# Patient Record
Sex: Male | Born: 1965 | Hispanic: Yes | Smoking: Never smoker
Health system: Southern US, Community
[De-identification: ages and names within clinical notes are randomized; demographics above are authoritative.]

## PROBLEM LIST (undated history)

## (undated) DIAGNOSIS — K219 Gastro-esophageal reflux disease without esophagitis: Secondary | ICD-10-CM

## (undated) DIAGNOSIS — I509 Heart failure, unspecified: Secondary | ICD-10-CM

## (undated) DIAGNOSIS — M549 Dorsalgia, unspecified: Secondary | ICD-10-CM

## (undated) DIAGNOSIS — I10 Essential (primary) hypertension: Secondary | ICD-10-CM

## (undated) DIAGNOSIS — G8929 Other chronic pain: Secondary | ICD-10-CM

## (undated) DIAGNOSIS — E7800 Pure hypercholesterolemia, unspecified: Secondary | ICD-10-CM

## (undated) DIAGNOSIS — I5022 Chronic systolic (congestive) heart failure: Secondary | ICD-10-CM

## (undated) DIAGNOSIS — T6591XA Toxic effect of unspecified substance, accidental (unintentional), initial encounter: Secondary | ICD-10-CM

## (undated) DIAGNOSIS — N200 Calculus of kidney: Secondary | ICD-10-CM

## (undated) HISTORY — DX: Chronic systolic (congestive) heart failure: I50.22

## (undated) HISTORY — PX: CARDIAC CATHETERIZATION: SHX172

## (undated) HISTORY — PX: CORNEAL TRANSPLANT: SHX108

## (undated) HISTORY — PX: EYE SURGERY: SHX253

---
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
## 2012-11-15 ENCOUNTER — Emergency Department (HOSPITAL_COMMUNITY)
Admission: EM | Admit: 2012-11-15 | Discharge: 2012-11-15 | Disposition: A | Discharge status: Nursing Home (Includes ICF) | Payer: Self-pay | Attending: Emergency Medicine | Admitting: Emergency Medicine

## 2012-11-15 ENCOUNTER — Emergency Department (HOSPITAL_COMMUNITY): Payer: Self-pay

## 2012-11-15 DIAGNOSIS — Z79899 Other long term (current) drug therapy: Secondary | ICD-10-CM | POA: Insufficient documentation

## 2012-11-15 DIAGNOSIS — R0602 Shortness of breath: Secondary | ICD-10-CM | POA: Insufficient documentation

## 2012-11-15 DIAGNOSIS — I10 Essential (primary) hypertension: Secondary | ICD-10-CM | POA: Diagnosis present

## 2012-11-15 DIAGNOSIS — R9439 Abnormal result of other cardiovascular function study: Secondary | ICD-10-CM | POA: Diagnosis present

## 2012-11-15 DIAGNOSIS — R0789 Other chest pain: Secondary | ICD-10-CM | POA: Insufficient documentation

## 2012-11-15 DIAGNOSIS — R079 Chest pain, unspecified: Secondary | ICD-10-CM | POA: Diagnosis present

## 2012-11-15 DIAGNOSIS — I200 Unstable angina: Secondary | ICD-10-CM | POA: Insufficient documentation

## 2012-11-15 LAB — BASIC METABOLIC PANEL
Chloride: 103 mEq/L (ref 96–112)
GFR calc Af Amer: >90 mL/min (ref >90)
GFR calc non Af Amer: >90 mL/min (ref >90)
Potassium: 3.5 mEq/L (ref 3.5–5.1)
Sodium: 137 mEq/L (ref 135–145)

## 2012-11-15 LAB — POCT I-STAT TROPONIN I: Troponin i, poc: 0.01 ng/mL (ref 0.00–0.08)

## 2012-11-15 LAB — CBC
HCT: 43.2 % (ref 39.0–52.0)
Hemoglobin: 14.5 g/dL (ref 13.0–17.0)
MCHC: 33.6 g/dL (ref 30.0–36.0)
RDW: 14.3 % (ref 11.5–15.5)
WBC: 9.6 10*3/uL (ref 4.0–10.5)

## 2012-11-15 LAB — PRO B NATRIURETIC PEPTIDE: Pro B Natriuretic peptide (BNP): 310.3 pg/mL — ABNORMAL HIGH (ref 0–125)

## 2012-11-15 MED ORDER — SODIUM CHLORIDE 0.9 % IV SOLN
Freq: Once | INTRAVENOUS | Status: AC
  Administered 2012-11-15: 17:00:00 via INTRAVENOUS

## 2012-11-15 MED ORDER — NITROGLYCERIN 0.4 MG SL SUBL
0.4000 mg | SUBLINGUAL_TABLET | Freq: Once | SUBLINGUAL | Status: AC
  Administered 2012-11-15: 0.4 mg via SUBLINGUAL

## 2012-11-15 MED ORDER — HEPARIN BOLUS VIA INFUSION
4000.0000 [IU] | Freq: Once | INTRAVENOUS | Status: AC
  Administered 2012-11-15: 4000 [IU] via INTRAVENOUS

## 2012-11-15 MED ORDER — HEPARIN (PORCINE) IN NACL 100-0.45 UNIT/ML-% IJ SOLN
1000.0000 [IU]/h | INTRAMUSCULAR | Status: DC
  Administered 2012-11-15: 1000 [IU]/h via INTRAVENOUS
  Filled 2012-11-15 (×2): qty 250

## 2012-11-15 MED ORDER — NITROGLYCERIN 2 % TD OINT
1.0000 [in_us] | TOPICAL_OINTMENT | Freq: Once | TRANSDERMAL | Status: DC
  Filled 2012-11-15: qty 30

## 2012-11-15 MED ORDER — NITROGLYCERIN IN D5W 200-5 MCG/ML-% IV SOLN: 10.0000 ug/min | INTRAVENOUS | Status: DC

## 2012-11-15 MED ORDER — NITROGLYCERIN 0.4 MG SL SUBL
0.4000 mg | SUBLINGUAL_TABLET | SUBLINGUAL | Status: DC | PRN
  Administered 2012-11-15: 0.4 mg via SUBLINGUAL
  Filled 2012-11-15: qty 25

## 2012-11-15 MED ORDER — ASPIRIN 81 MG PO CHEW
324.0000 mg | CHEWABLE_TABLET | Freq: Once | ORAL | Status: AC
  Administered 2012-11-15: 324 mg via ORAL
  Filled 2012-11-15: qty 4
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
## 2012-11-15 NOTE — Progress Notes (Signed)
ANTICOAGULATION CONSULT NOTE - Initial Consult  Pharmacy Consult for IV heparin Indication: chest pain/ACS  No Known Allergies  Patient Measurements:   Wt 230 lb (105kg)  Vital Signs: Temp: 98.4 F (36.9 C) (08/24 1246) Temp src: Oral (08/24 1246) BP: 127/84 mmHg (08/24 1343) Pulse Rate: 61 (08/24 1343)  Labs:  Recent Labs  11/15/12 1255  HGB 14.5  HCT 43.2  PLT 331  CREATININE 0.91    CrCl is unknown because there is no height on file for the current visit.   Medical History: No past medical history on file.  Medications:  Scheduled:  . nitroGLYCERIN  0.4 mg Sublingual Once   Infusions:  . nitroGLYCERIN      Assessment: 47 yo male with ACS/STEMI to start IV heparin per pharmacy dosing  Baseline CBC stable  Goal of Therapy:  Heparin level 0.3-0.7 units/ml Monitor platelets by anticoagulation protocol: Yes   Plan:  1) 4000 unit IV heparin bolus then 2) 1000 units/hr IV heparin 3) heparin level 6 hours after start heparin 4) daily heparin level and CBC while on IV heparin   Hessie Knows, PharmD, BCPS Pager [phone] 11/15/2012 4:14 PM

## 2012-11-15 NOTE — ED Notes (Signed)
At 1510 hours, Morrie Sheldon took over as his Radio producer.  At this time I give report to Gunnar Fusi, RN with [facility].  Pt. Remains in no distress.

## 2012-11-15 NOTE — ED Notes (Signed)
I placed him in room one, and thus far I have started IV and given meds.  He states his chest discomfort has resolved, but he now has a mild, generalized h/a.  His skin is normal, warm and dry and he is breathing normally.

## 2012-11-15 NOTE — ED Notes (Signed)
Pt states that he started having CP last night around 2130 while working.  States he feels SOB.  Had a cardiac cath at [facility] Aug 4.

## 2012-11-15 NOTE — ED Notes (Addendum)
Charge Social worker aware of pending nitro drip CP 1/10 at present- vital HR 61 BP 120/74. With nitro SL given earlier. Heparin started.- report given by Charged TIM RN to [employer]

## 2013-07-19 ENCOUNTER — Emergency Department (HOSPITAL_COMMUNITY): Payer: Self-pay

## 2013-07-19 ENCOUNTER — Encounter (HOSPITAL_COMMUNITY): Payer: Self-pay | Admitting: Emergency Medicine

## 2013-07-19 ENCOUNTER — Inpatient Hospital Stay (HOSPITAL_COMMUNITY)
Admission: EM | Admit: 2013-07-19 | Discharge: 2013-07-20 | DRG: 103 | Discharge status: Against Medical Advice | Payer: Self-pay | Attending: Internal Medicine | Admitting: Internal Medicine

## 2013-07-19 DIAGNOSIS — R51 Headache: Principal | ICD-10-CM

## 2013-07-19 DIAGNOSIS — I509 Heart failure, unspecified: Secondary | ICD-10-CM | POA: Diagnosis present

## 2013-07-19 DIAGNOSIS — R42 Dizziness and giddiness: Secondary | ICD-10-CM

## 2013-07-19 DIAGNOSIS — R079 Chest pain, unspecified: Secondary | ICD-10-CM

## 2013-07-19 DIAGNOSIS — I10 Essential (primary) hypertension: Secondary | ICD-10-CM | POA: Diagnosis present

## 2013-07-19 DIAGNOSIS — R519 Headache, unspecified: Secondary | ICD-10-CM

## 2013-07-19 DIAGNOSIS — K219 Gastro-esophageal reflux disease without esophagitis: Secondary | ICD-10-CM | POA: Diagnosis present

## 2013-07-19 DIAGNOSIS — R0789 Other chest pain: Secondary | ICD-10-CM | POA: Diagnosis present

## 2013-07-19 HISTORY — DX: Other chronic pain: G89.29

## 2013-07-19 HISTORY — DX: Essential (primary) hypertension: I10

## 2013-07-19 HISTORY — DX: Heart failure, unspecified: I50.9

## 2013-07-19 HISTORY — DX: Dorsalgia, unspecified: M54.9

## 2013-07-19 LAB — CBC WITH DIFFERENTIAL/PLATELET
BASOS ABS: 0 10*3/uL (ref 0.0–0.1)
BASOS PCT: 0 % (ref 0–1)
EOS PCT: 2 % (ref 0–5)
Eosinophils Absolute: 0.2 10*3/uL (ref 0.0–0.7)
HEMATOCRIT: 40.7 % (ref 39.0–52.0)
HEMOGLOBIN: 14.2 g/dL (ref 13.0–17.0)
Lymphocytes Relative: 34 % (ref 12–46)
Lymphs Abs: 3.4 10*3/uL (ref 0.7–4.0)
MCH: 29.5 pg (ref 26.0–34.0)
MCHC: 34.9 g/dL (ref 30.0–36.0)
MCV: 84.4 fL (ref 78.0–100.0)
Monocytes Absolute: 0.8 10*3/uL (ref 0.1–1.0)
Monocytes Relative: 8 % (ref 3–12)
Neutro Abs: 5.4 10*3/uL (ref 1.7–7.7)
Neutrophils Relative %: 56 % (ref 43–77)
Platelets: 323 10*3/uL (ref 150–400)
RBC: 4.82 MIL/uL (ref 4.22–5.81)
RDW: 14.2 % (ref 11.5–15.5)
WBC: 9.9 10*3/uL (ref 4.0–10.5)

## 2013-07-19 LAB — COMPREHENSIVE METABOLIC PANEL
ALBUMIN: 3.8 g/dL (ref 3.5–5.2)
ALT: 38 U/L (ref 0–53)
AST: 23 U/L (ref 0–37)
Alkaline Phosphatase: 69 U/L (ref 39–117)
BILIRUBIN TOTAL: 0.2 mg/dL — AB (ref 0.3–1.2)
BUN: 30 mg/dL — AB (ref 6–23)
CHLORIDE: 104 meq/L (ref 96–112)
CO2: 24 mEq/L (ref 19–32)
CREATININE: 1.1 mg/dL (ref 0.50–1.35)
Calcium: 9.2 mg/dL (ref 8.4–10.5)
GFR calc Af Amer: 89 mL/min — ABNORMAL LOW (ref >90)
GFR, EST NON AFRICAN AMERICAN: 77 mL/min — AB (ref >90)
Glucose, Bld: 96 mg/dL (ref 70–99)
Potassium: 3.6 mEq/L — ABNORMAL LOW (ref 3.7–5.3)
Sodium: 143 mEq/L (ref 137–147)
Total Protein: 8 g/dL (ref 6.0–8.3)

## 2013-07-19 LAB — D-DIMER, QUANTITATIVE (NOT AT ARMC): D-Dimer, Quant: <0.27 ug/mL-FEU (ref 0.00–0.48)

## 2013-07-19 LAB — TROPONIN I

## 2013-07-19 LAB — PRO B NATRIURETIC PEPTIDE: Pro B Natriuretic peptide (BNP): 83.8 pg/mL (ref 0–125)

## 2013-07-19 MED ORDER — MORPHINE SULFATE 2 MG/ML IJ SOLN
2.0000 mg | Freq: Once | INTRAMUSCULAR | Status: AC
  Administered 2013-07-19: 2 mg via INTRAVENOUS

## 2013-07-19 MED ORDER — MORPHINE SULFATE 2 MG/ML IJ SOLN
INTRAMUSCULAR | Status: AC
Start: 2013-07-19 — End: 2013-07-19
  Administered 2013-07-19: 4 mg via INTRAVENOUS
  Filled 2013-07-19: qty 2

## 2013-07-19 MED ORDER — DIPHENHYDRAMINE HCL 50 MG/ML IJ SOLN
25.0000 mg | Freq: Once | INTRAMUSCULAR | Status: AC
  Administered 2013-07-20: 25 mg via INTRAVENOUS
  Filled 2013-07-19: qty 1

## 2013-07-19 MED ORDER — PROCHLORPERAZINE EDISYLATE 5 MG/ML IJ SOLN
10.0000 mg | Freq: Four times a day (QID) | INTRAMUSCULAR | Status: DC | PRN
  Administered 2013-07-20: 10 mg via INTRAVENOUS
  Filled 2013-07-19 (×2): qty 2

## 2013-07-19 MED ORDER — MORPHINE SULFATE 2 MG/ML IJ SOLN
INTRAMUSCULAR | Status: AC
  Filled 2013-07-19: qty 2

## 2013-07-19 MED ORDER — IOHEXOL 350 MG/ML SOLN
100.0000 mL | Freq: Once | INTRAVENOUS | Status: AC | PRN
  Administered 2013-07-19: 100 mL via INTRAVENOUS

## 2013-07-19 MED ORDER — MORPHINE SULFATE 4 MG/ML IJ SOLN: 4.0000 mg | Freq: Once | INTRAMUSCULAR | Status: AC

## 2013-07-19 MED ORDER — SODIUM CHLORIDE 0.9 % IV BOLUS (SEPSIS)
1000.0000 mL | Freq: Once | INTRAVENOUS | Status: AC
  Administered 2013-07-20: 1000 mL via INTRAVENOUS

## 2013-07-19 NOTE — ED Notes (Addendum)
Presents from work with sudden onset of headache, turned to coworker and stated he did not feel good, then collapsed, did not lose consciousness. Upon arrival to ED pt presents with chest pain, continues to repeat "my head, my head" in spanish, following commands, asking repetitive questions. Moaning in pain. Pt is alert.and 1 nitro by EMS

## 2013-07-19 NOTE — ED Provider Notes (Signed)
CSN: [id number]     Arrival date & time 07/19/13  2142 History   First MD Initiated Contact with Patient 07/19/13 2202     Chief Complaint  Patient presents with  . Headache  . Chest Pain     (Consider location/radiation/quality/duration/timing/severity/associated sxs/prior Treatment) The history is provided by the EMS personnel. The history is limited by a language barrier. A language interpreter was used.   history of present illness: Patient presents via EMS with chief complaint of headache and chest pain.  Patient is unable to indicate when his pain started.  Through the interpreter the patient continues to does state over and over "my head hurts and it feels like it's going to explode."  Patient also complaining of chest pain.  Patient unable to qualify chest pain.  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  He was at work today when a coworker came walking toward him.  He looked at his coworker and stated that he did not feel well" slumped over".  The patient never lost consciousness.  On EMS arrival the patient was continuing to complain over and over of his headache and chest pain but would not answer any additional questions.  Past Medical History  Diagnosis Date  . CHF (congestive heart failure)   . Hypertension    History reviewed. No pertinent past surgical history. History reviewed. No pertinent family history. History  Substance Use Topics  . Smoking status: Not on file  . Smokeless tobacco: Not on file  . Alcohol Use: Not on file    Review of Systems  Constitutional: Negative for fever.  Eyes: Negative.   Respiratory: Negative for shortness of breath.   Cardiovascular: Positive for chest pain.  Gastrointestinal: Positive for abdominal pain.  Genitourinary: Negative.   Musculoskeletal: Negative for neck pain.  Skin: Negative.   Neurological: Positive for dizziness and headaches.  All other systems reviewed and are negative.     Allergies  Review of  patient's allergies indicates no known allergies.  Home Medications   Prior to Admission medications   Not on File   BP 130/74  Pulse 61  Temp(Src) 97.2 F (36.2 C)  Resp 10  SpO2 96% Physical Exam  Nursing note and vitals reviewed. Constitutional: He appears well-developed and well-nourished.  HENT:  Head: Normocephalic and atraumatic.  Eyes: Conjunctivae and EOM are normal. Pupils are equal, round, and reactive to light.  Neck: Normal range of motion. Neck supple.  Cardiovascular: Normal rate, regular rhythm, normal heart sounds and intact distal pulses.   No murmur heard. Pulmonary/Chest: Effort normal and breath sounds normal. He has no wheezes. He has no rales.  Abdominal: Soft. He exhibits no distension. There is tenderness (generalized).  Musculoskeletal: Normal range of motion.  Neurological: He is alert. GCS eye subscore is 4. GCS verbal subscore is 4. GCS motor subscore is 6.  Bilateral weakness throughout upper extremities.    Skin: Skin is warm and dry.    ED Course  Procedures (including critical care time) Labs Review Labs Reviewed  COMPREHENSIVE METABOLIC PANEL - Abnormal; Notable for the following:    Potassium 3.6 (*)    BUN 30 (*)    Total Bilirubin 0.2 (*)    GFR calc non Af Amer 77 (*)    GFR calc Af Amer 89 (*)    All other components within normal limits  CBC WITH DIFFERENTIAL  TROPONIN I  PRO B NATRIURETIC PEPTIDE  D-DIMER, QUANTITATIVE    Imaging Review Ct  Head Wo Contrast  07/19/2013   CLINICAL DATA:  Headache.  EXAM: CT HEAD WITHOUT CONTRAST  TECHNIQUE: Contiguous axial images were obtained from the base of the skull through the vertex without intravenous contrast.  COMPARISON:  None.  FINDINGS: There is no evidence of acute infarction, mass lesion, or intra- or extra-axial hemorrhage on CT.  Evaluation is mildly suboptimal due to motion artifact.  The posterior fossa, including the cerebellum, brainstem and fourth ventricle, is within normal  limits. The third and lateral ventricles, and basal ganglia are unremarkable in appearance. The cerebral hemispheres are symmetric in appearance, with normal gray-white differentiation. No mass effect or midline shift is seen.  There is no evidence of fracture; visualized osseous structures are unremarkable in appearance. The orbits are within normal limits. Mucus retention cysts or polyps are seen within the maxillary sinuses bilaterally. The remaining paranasal sinuses and mastoid air cells are well-aerated. No significant soft tissue abnormalities are seen.  IMPRESSION: 1. No acute intracranial pathology seen on CT. 2. Mucus retention cysts or polyps seen within the maxillary sinuses bilaterally.   Electronically Signed   By: Roanna Raider M.D.   On: 07/19/2013 23:24   Dg Chest Portable 1 View  07/19/2013   CLINICAL DATA:  Chest pain and headache.  EXAM: PORTABLE CHEST - 1 VIEW  COMPARISON:  None.  FINDINGS: The lungs are well-aerated and clear. There is no evidence of focal opacification, pleural effusion or pneumothorax.  The cardiomediastinal silhouette is borderline enlarged. No acute osseous abnormalities are seen.  IMPRESSION: No acute cardiopulmonary process seen.  Borderline cardiomegaly.   Electronically Signed   By: Roanna Raider M.D.   On: 07/19/2013 22:58   Ct Angio Chest Aortic Dissect W &/or W/o  07/20/2013   CLINICAL DATA:  Collapse without loss of consciousness.  EXAM: CT ANGIOGRAPHY CHEST, ABDOMEN  TECHNIQUE: Multidetector CT imaging through the chest and abdomen was performed using the standard protocol during bolus administration of intravenous contrast. Multiplanar reconstructed images and MIPs were obtained and reviewed to evaluate the vascular anatomy.  CONTRAST:  OMNIPAQUE IOHEXOL 350 MG/ML SOLN  COMPARISON:  DG CHEST 1V PORT dated 07/19/2013  FINDINGS: CTA CHEST FINDINGS  The caliber of the thoracic aorta is normal. There is no evidence of a false lumen. There is no periaortic  hematoma. The cardiac chambers are mildly enlarged. There is no pericardial effusion. There are coronary artery calcifications demonstrated. There is no mediastinal nor hilar lymphadenopathy. The thoracic esophagus is normal in caliber. Contrast within the pulmonary arterial tree is normal in appearance.  At lung window settings there is no interstitial or alveolar infiltrate. There is subsegmental atelectasis in the dependent portion of both lungs. There is no pneumothorax or pneumomediastinum or pleural effusion. The bony thorax exhibits no acute abnormalities.  Review of the MIP images confirms the above findings.  CTA ABDOMEN FINDINGS  The caliber of the abdominal aorta is normal. There is no evidence of a false lumen. The proximal common iliac arteries exhibit normal caliber. The celiac trunk, the SMA, the renal arteries, and the IMA fill well at their origins. There is no periaortic hematoma.  The liver exhibits decreased density diffusely consistent with fatty infiltration. The gallbladder, pancreas, spleen, and adrenal glands are normal in appearance. The kidneys exhibit no evidence of obstruction. There are multiple renal cortical hypodensities most compatible with cysts. The perinephric fat is normal in appearance. There are no calcified kidney stones.  The visualized portions of the small and large bowel  exhibit no evidence of ileus or obstruction. The visualized portions of the lumbar spine also appear normal.  Review of the MIP images confirms the above findings.  IMPRESSION: 1. There is no evidence of thoracic or abdominal aortic dissection. The caliber of the thoracic and abdominal aorta is normal. 2. There is mild enlargement of the cardiac chambers without overt evidence of pulmonary edema. 3. There is minimal dependent atelectasis in both lungs. 4. No acute visceral abnormality within the abdomen is demonstrated. 5. There are multiple hypodensities within both kidneys which likely reflect cysts.  These will merit further evaluation with elective renal protocol MRI or renal ultrasound when the patient has recovered from his acute illness.   Electronically Signed   By: David  [place]   On: 07/20/2013 00:40   Ct Cta Abd/pel W/cm &/or W/o Cm  07/20/2013   CLINICAL DATA:  Collapse without loss of consciousness.  EXAM: CT ANGIOGRAPHY CHEST, ABDOMEN  TECHNIQUE: Multidetector CT imaging through the chest and abdomen was performed using the standard protocol during bolus administration of intravenous contrast. Multiplanar reconstructed images and MIPs were obtained and reviewed to evaluate the vascular anatomy.  CONTRAST:  OMNIPAQUE IOHEXOL 350 MG/ML SOLN  COMPARISON:  DG CHEST 1V PORT dated 07/19/2013  FINDINGS: CTA CHEST FINDINGS  The caliber of the thoracic aorta is normal. There is no evidence of a false lumen. There is no periaortic hematoma. The cardiac chambers are mildly enlarged. There is no pericardial effusion. There are coronary artery calcifications demonstrated. There is no mediastinal nor hilar lymphadenopathy. The thoracic esophagus is normal in caliber. Contrast within the pulmonary arterial tree is normal in appearance.  At lung window settings there is no interstitial or alveolar infiltrate. There is subsegmental atelectasis in the dependent portion of both lungs. There is no pneumothorax or pneumomediastinum or pleural effusion. The bony thorax exhibits no acute abnormalities.  Review of the MIP images confirms the above findings.  CTA ABDOMEN FINDINGS  The caliber of the abdominal aorta is normal. There is no evidence of a false lumen. The proximal common iliac arteries exhibit normal caliber. The celiac trunk, the SMA, the renal arteries, and the IMA fill well at their origins. There is no periaortic hematoma.  The liver exhibits decreased density diffusely consistent with fatty infiltration. The gallbladder, pancreas, spleen, and adrenal glands are normal in appearance. The kidneys  exhibit no evidence of obstruction. There are multiple renal cortical hypodensities most compatible with cysts. The perinephric fat is normal in appearance. There are no calcified kidney stones.  The visualized portions of the small and large bowel exhibit no evidence of ileus or obstruction. The visualized portions of the lumbar spine also appear normal.  Review of the MIP images confirms the above findings.  IMPRESSION: 1. There is no evidence of thoracic or abdominal aortic dissection. The caliber of the thoracic and abdominal aorta is normal. 2. There is mild enlargement of the cardiac chambers without overt evidence of pulmonary edema. 3. There is minimal dependent atelectasis in both lungs. 4. No acute visceral abnormality within the abdomen is demonstrated. 5. There are multiple hypodensities within both kidneys which likely reflect cysts. These will merit further evaluation with elective renal protocol MRI or renal ultrasound when the patient has recovered from his acute illness.   Electronically Signed   By: David  [place]   On: 07/20/2013 00:40     EKG Interpretation   Date/Time:  Monday July 19 2013 21:50:49 EDT Ventricular Rate:  76 PR Interval:  157 QRS Duration: 96 QT Interval:  387 QTC Calculation: 435 R Axis:   31 Text Interpretation:  Sinus rhythm Abnormal T, consider ischemia, diffuse  leads Minimal ST elevation, anterior leads Baseline wander in lead(s) V4  LVH, d/w Dr. Allyson Sabalberry No previous ECGs available Confirmed by Manus GunningANCOUR  MD,  STEPHEN [phone]([id number]) on 07/19/2013 10:26:50 PM      MDM   Final diagnoses: 1. Vertigo   2. Headache(784.0)   3. Chest pain      48 year old Hispanic male with history of hypertension and CHF who presents via EMS with complaints of sudden onset headache, dizziness, chest pain, and abdominal pain.  Vital signs stable arrival.  Difficulty obtaining history through the interpreter.  Patient stating over and over that his head hurt.  Exam remarkable for  some nonlocalized abdominal tenderness to palpation.  Patient also with weakness throughout bilateral upper extremities though inconsistent exam.  Patient stating that he is too dizzy to participate in testing of coordination or gait.  Initial presentation was concerning for possible subarachnoid hemorrhage, aortic dissection, ACS.  Labs obtained were unremarkable including troponin, BNP and d-dimer.  CT of the head was negative for any acute intracranial abnormality.  Patient presenting with an first 6 hours of headache and was negative CT doubt SAH. CT of the aorta was negative for dissection.  Patient called while in the emergency department.  He continued to complain of severe dizziness and weakness.  Neurology was consult with and evaluated patient.  Concern for possible complicated migraine though patient could have small infarct of the basilar artery that could also cause symptoms.  Unable to obtain additional CTA of the head and neck do to patient already receiving contrast.  Neurology recommends giving aspirin and migraine cocktail to see if headache and dizziness improved.  Recommended admission and possible MRI/MRA if symptoms do not improve with Migraine cocktail.  Hospitalist consulted and will admit for further management.    Cherre RobinsBryan Nealy Karapetian, MD 07/20/13 [phone]

## 2013-07-19 NOTE — ED Notes (Addendum)
Dr. Thad Ranger, Neurologist at bedside.

## 2013-07-20 ENCOUNTER — Encounter (HOSPITAL_COMMUNITY): Payer: Self-pay | Admitting: Emergency Medicine

## 2013-07-20 DIAGNOSIS — R51 Headache: Principal | ICD-10-CM

## 2013-07-20 DIAGNOSIS — R079 Chest pain, unspecified: Secondary | ICD-10-CM

## 2013-07-20 DIAGNOSIS — R42 Dizziness and giddiness: Secondary | ICD-10-CM

## 2013-07-20 DIAGNOSIS — R519 Headache, unspecified: Secondary | ICD-10-CM | POA: Diagnosis present

## 2013-07-20 DIAGNOSIS — R072 Precordial pain: Secondary | ICD-10-CM

## 2013-07-20 LAB — TROPONIN I

## 2013-07-20 LAB — BASIC METABOLIC PANEL
BUN: 28 mg/dL — ABNORMAL HIGH (ref 6–23)
CO2: 27 meq/L (ref 19–32)
CREATININE: 1.02 mg/dL (ref 0.50–1.35)
Calcium: 8.7 mg/dL (ref 8.4–10.5)
Chloride: 104 mEq/L (ref 96–112)
GFR calc non Af Amer: 84 mL/min — ABNORMAL LOW (ref >90)
Glucose, Bld: 98 mg/dL (ref 70–99)
Potassium: 3.4 mEq/L — ABNORMAL LOW (ref 3.7–5.3)
Sodium: 143 mEq/L (ref 137–147)

## 2013-07-20 MED ORDER — ASPIRIN 325 MG PO TABS: 325.0000 mg | ORAL_TABLET | Freq: Every day | ORAL | Status: DC

## 2013-07-20 MED ORDER — HEPARIN SODIUM (PORCINE) 5000 UNIT/ML IJ SOLN
5000.0000 [IU] | Freq: Three times a day (TID) | INTRAMUSCULAR | Status: DC
  Filled 2013-07-20 (×4): qty 1

## 2013-07-20 MED ORDER — SODIUM CHLORIDE 0.9 % IJ SOLN
3.0000 mL | Freq: Two times a day (BID) | INTRAMUSCULAR | Status: DC
  Administered 2013-07-20: 3 mL via INTRAVENOUS

## 2013-07-20 MED ORDER — POTASSIUM CHLORIDE CRYS ER 20 MEQ PO TBCR
40.0000 meq | EXTENDED_RELEASE_TABLET | Freq: Once | ORAL | Status: AC
  Administered 2013-07-20: 40 meq via ORAL

## 2013-07-20 MED ORDER — FUROSEMIDE 20 MG PO TABS: 20.0000 mg | ORAL_TABLET | Freq: Every day | ORAL | Status: DC

## 2013-07-20 MED ORDER — MORPHINE SULFATE 2 MG/ML IJ SOLN: 2.0000 mg | INTRAMUSCULAR | Status: DC | PRN

## 2013-07-20 MED ORDER — ASPIRIN EC 81 MG PO TBEC: 81.0000 mg | DELAYED_RELEASE_TABLET | Freq: Every day | ORAL | Status: DC

## 2013-07-20 MED ORDER — ASPIRIN 81 MG PO CHEW
324.0000 mg | CHEWABLE_TABLET | Freq: Once | ORAL | Status: AC
  Administered 2013-07-20: 324 mg via ORAL
  Filled 2013-07-20: qty 4

## 2013-07-20 NOTE — Discharge Summary (Signed)
Physician Discharge Summary  Patient ID: Phillip [name] MRN: [id number] DOB/AGE: 50/05/1965 50 y.o.  Admit date: 07/19/2013 Discharge date: 07/20/2013  Primary Care Physician:  No PCP Per Patient  Discharge Diagnoses:    . Headache Chest pain GERD  Consults:  Neurology, Dr. Thad Ranger                     Cardiology, Dr. Rennis Golden   Please note that the patient was officially being discharged but he decided to leave AMA prior to the discharge was completed.  Allergies:  No Known Allergies   Discharge Medications: Aspirin was only new medication written however patient did not take any prescription with him as he left AMA prior to discharge being completed   Medication List         aspirin EC 81 MG tablet  Take 1 tablet (81 mg total) by mouth daily.      benazepril-hydrochlorthiazide 20-12.5 MG per tablet  Commonly known as:  LOTENSIN HCT  Take 1 tablet by mouth daily.     carvedilol 25 MG tablet  Commonly known as:  COREG  Take 25 mg by mouth 2 (two) times daily with a meal.     enalapril 20 MG tablet  Commonly known as:  VASOTEC  Take 20 mg by mouth daily.     furosemide 20 MG tablet  Commonly known as:  LASIX  Take 1 tablet (20 mg total) by mouth daily.  Start taking on:  07/21/2013     prednisoLONE acetate 1 % ophthalmic suspension  Commonly known as:  PRED FORTE  Place 1 drop into the right eye 3 (three) times daily.     ranitidine 150 MG tablet  Commonly known as:  ZANTAC  Take 150 mg by mouth at bedtime.         Brief H and P: For complete details please refer to admission H and P, but in briefJose [name] is a 48 y.o. male who presents to the ED with sudden onset of severe headache at work today. He reported that he didn't feel good and then collapsed. No LOC. Patient was brought to the ED. Headache throughout his head. No associated nausea nor vomiting, does have dizziness which has persisted even after his headache is controlled with morphine  and a headache cocktail.   Hospital Course:  Sudden headache with dizziness  : This apparently happened at work with sudden onset headache, cranial with no associated nausea, vomiting but he did have dizziness, also described chest pain and back pain. CT of the head was negative for acute stroke or intracranial pathology. Neurology was consulted, he did not have any focal neurological deficits but did not give full cooperation due to pain. With dizziness, headache there was a concern for posterior circulation but also possibility of dizziness related to his headache. Patient was given migraine cocktail by neurology and recommended MRI/MRA if dizziness persists. Neuro checks were continued and patient was placed on aspirin. After patient's symptoms somewhat improved, patient wanted to leave, I discussed with on-call neurologist, Dr. Cyril Mourning who recommended to discontinue MRI/MRA if the patient has remained stable and no new neurological deficits.  Chest pain: At the time of encounter in the morning, patient was still having 2/10 chest pain . EKG reviewed with Dr. Rennis Golden, ? Minimal ST elevations, T wave inversions with repolarization changes in V5 and V 6. Patient was continued on aspirin, 2-D echocardiogram was obtained which showed EF of 55-60%, no regional wall motion abnormalities.  CT angiogram of the chest and abdomen showed no evidence of thoracic or abdominal aortic dissection. His outpatient records were obtained, he had a normal cath in August 2014. Nuclear medicine stress test was canceled.   He was actually cleared for discharge however patient did not stay for official discharge.   Day of Discharge BP 121/69  Pulse 78  Temp(Src) 98.4 F (36.9 C) (Oral)  Resp 22  Wt 101.424 kg (223 lb 9.6 oz)  SpO2 98%  Physical Exam: General: Alert and awake oriented x3 not in any acute distress. CVS: S1-S2 clear no murmur rubs or gallops Chest: clear to auscultation bilaterally, no wheezing rales or  rhonchi Abdomen: soft nontender, nondistended, normal bowel sounds Extremities: no cyanosis, clubbing or edema noted bilaterally Neuro: Cranial nerves II-XII intact, no focal neurological deficits   The results of significant diagnostics from this hospitalization (including imaging, microbiology, ancillary and laboratory) are listed below for reference.    LAB RESULTS: Basic Metabolic Panel:  Recent Labs Lab 07/19/13 2155 07/20/13 1030  NA 143 143  K 3.6* 3.4*  CL 104 104  CO2 24 27  GLUCOSE 96 98  BUN 30* 28*  CREATININE 1.10 1.02  CALCIUM 9.2 8.7   Liver Function Tests:  Recent Labs Lab 07/19/13 2155  AST 23  ALT 38  ALKPHOS 69  BILITOT 0.2*  PROT 8.0  ALBUMIN 3.8   No results found for this basename: LIPASE, AMYLASE,  in the last 168 hours No results found for this basename: AMMONIA,  in the last 168 hours CBC:  Recent Labs Lab 07/19/13 2155  WBC 9.9  NEUTROABS 5.4  HGB 14.2  HCT 40.7  MCV 84.4  PLT 323   Cardiac Enzymes:  Recent Labs Lab 07/20/13 1030 07/20/13 1500  TROPONINI <0.30 <0.30   BNP: No components found with this basename: POCBNP,  CBG: No results found for this basename: GLUCAP,  in the last 168 hours  Significant Diagnostic Studies:  Ct Head Wo Contrast  07/19/2013   CLINICAL DATA:  Headache.  EXAM: CT HEAD WITHOUT CONTRAST  TECHNIQUE: Contiguous axial images were obtained from the base of the skull through the vertex without intravenous contrast.  COMPARISON:  None.  FINDINGS: There is no evidence of acute infarction, mass lesion, or intra- or extra-axial hemorrhage on CT.  Evaluation is mildly suboptimal due to motion artifact.  The posterior fossa, including the cerebellum, brainstem and fourth ventricle, is within normal limits. The third and lateral ventricles, and basal ganglia are unremarkable in appearance. The cerebral hemispheres are symmetric in appearance, with normal gray-white differentiation. No mass effect or midline  shift is seen.  There is no evidence of fracture; visualized osseous structures are unremarkable in appearance. The orbits are within normal limits. Mucus retention cysts or polyps are seen within the maxillary sinuses bilaterally. The remaining paranasal sinuses and mastoid air cells are well-aerated. No significant soft tissue abnormalities are seen.  IMPRESSION: 1. No acute intracranial pathology seen on CT. 2. Mucus retention cysts or polyps seen within the maxillary sinuses bilaterally.   Electronically Signed   By: Roanna RaiderJeffery  Chang M.D.   On: 07/19/2013 23:24   Dg Chest Portable 1 View  07/19/2013   CLINICAL DATA:  Chest pain and headache.  EXAM: PORTABLE CHEST - 1 VIEW  COMPARISON:  None.  FINDINGS: The lungs are well-aerated and clear. There is no evidence of focal opacification, pleural effusion or pneumothorax.  The cardiomediastinal silhouette is borderline enlarged. No acute osseous abnormalities are seen.  IMPRESSION: No acute cardiopulmonary process seen.  Borderline cardiomegaly.   Electronically Signed   By: Roanna Raider M.D.   On: 07/19/2013 22:58   Ct Angio Chest Aortic Dissect W &/or W/o  07/20/2013   CLINICAL DATA:  Collapse without loss of consciousness.  EXAM: CT ANGIOGRAPHY CHEST, ABDOMEN  TECHNIQUE: Multidetector CT imaging through the chest and abdomen was performed using the standard protocol during bolus administration of intravenous contrast. Multiplanar reconstructed images and MIPs were obtained and reviewed to evaluate the vascular anatomy.  CONTRAST:  OMNIPAQUE IOHEXOL 350 MG/ML SOLN  COMPARISON:  DG CHEST 1V PORT dated 07/19/2013  FINDINGS: CTA CHEST FINDINGS  The caliber of the thoracic aorta is normal. There is no evidence of a false lumen. There is no periaortic hematoma. The cardiac chambers are mildly enlarged. There is no pericardial effusion. There are coronary artery calcifications demonstrated. There is no mediastinal nor hilar lymphadenopathy. The thoracic  esophagus is normal in caliber. Contrast within the pulmonary arterial tree is normal in appearance.  At lung window settings there is no interstitial or alveolar infiltrate. There is subsegmental atelectasis in the dependent portion of both lungs. There is no pneumothorax or pneumomediastinum or pleural effusion. The bony thorax exhibits no acute abnormalities.  Review of the MIP images confirms the above findings.  CTA ABDOMEN FINDINGS  The caliber of the abdominal aorta is normal. There is no evidence of a false lumen. The proximal common iliac arteries exhibit normal caliber. The celiac trunk, the SMA, the renal arteries, and the IMA fill well at their origins. There is no periaortic hematoma.  The liver exhibits decreased density diffusely consistent with fatty infiltration. The gallbladder, pancreas, spleen, and adrenal glands are normal in appearance. The kidneys exhibit no evidence of obstruction. There are multiple renal cortical hypodensities most compatible with cysts. The perinephric fat is normal in appearance. There are no calcified kidney stones.  The visualized portions of the small and large bowel exhibit no evidence of ileus or obstruction. The visualized portions of the lumbar spine also appear normal.  Review of the MIP images confirms the above findings.  IMPRESSION: 1. There is no evidence of thoracic or abdominal aortic dissection. The caliber of the thoracic and abdominal aorta is normal. 2. There is mild enlargement of the cardiac chambers without overt evidence of pulmonary edema. 3. There is minimal dependent atelectasis in both lungs. 4. No acute visceral abnormality within the abdomen is demonstrated. 5. There are multiple hypodensities within both kidneys which likely reflect cysts. These will merit further evaluation with elective renal protocol MRI or renal ultrasound when the patient has recovered from his acute illness.   Electronically Signed   By: David  [place]   On: 07/20/2013  00:40   Ct Cta Abd/pel W/cm &/or W/o Cm  07/20/2013   CLINICAL DATA:  Collapse without loss of consciousness.  EXAM: CT ANGIOGRAPHY CHEST, ABDOMEN  TECHNIQUE: Multidetector CT imaging through the chest and abdomen was performed using the standard protocol during bolus administration of intravenous contrast. Multiplanar reconstructed images and MIPs were obtained and reviewed to evaluate the vascular anatomy.  CONTRAST:  OMNIPAQUE IOHEXOL 350 MG/ML SOLN  COMPARISON:  DG CHEST 1V PORT dated 07/19/2013  FINDINGS: CTA CHEST FINDINGS  The caliber of the thoracic aorta is normal. There is no evidence of a false lumen. There is no periaortic hematoma. The cardiac chambers are mildly enlarged. There is no pericardial effusion. There are coronary artery calcifications demonstrated. There is no mediastinal  nor hilar lymphadenopathy. The thoracic esophagus is normal in caliber. Contrast within the pulmonary arterial tree is normal in appearance.  At lung window settings there is no interstitial or alveolar infiltrate. There is subsegmental atelectasis in the dependent portion of both lungs. There is no pneumothorax or pneumomediastinum or pleural effusion. The bony thorax exhibits no acute abnormalities.  Review of the MIP images confirms the above findings.  CTA ABDOMEN FINDINGS  The caliber of the abdominal aorta is normal. There is no evidence of a false lumen. The proximal common iliac arteries exhibit normal caliber. The celiac trunk, the SMA, the renal arteries, and the IMA fill well at their origins. There is no periaortic hematoma.  The liver exhibits decreased density diffusely consistent with fatty infiltration. The gallbladder, pancreas, spleen, and adrenal glands are normal in appearance. The kidneys exhibit no evidence of obstruction. There are multiple renal cortical hypodensities most compatible with cysts. The perinephric fat is normal in appearance. There are no calcified kidney stones.  The visualized  portions of the small and large bowel exhibit no evidence of ileus or obstruction. The visualized portions of the lumbar spine also appear normal.  Review of the MIP images confirms the above findings.  IMPRESSION: 1. There is no evidence of thoracic or abdominal aortic dissection. The caliber of the thoracic and abdominal aorta is normal. 2. There is mild enlargement of the cardiac chambers without overt evidence of pulmonary edema. 3. There is minimal dependent atelectasis in both lungs. 4. No acute visceral abnormality within the abdomen is demonstrated. 5. There are multiple hypodensities within both kidneys which likely reflect cysts. These will merit further evaluation with elective renal protocol MRI or renal ultrasound when the patient has recovered from his acute illness.   Electronically Signed   By: David  [place]   On: 07/20/2013 00:40    2D ECHO: Study Conclusions  - Left ventricle: The cavity size was normal. Wall thickness was normal. Systolic function was normal. The estimated ejection fraction was in the range of 55% to 60%. Wall motion was normal; there were no regional wall motion abnormalities. Left ventricular diastolic function parameters were normal. - Left atrium: The atrium was normal in size. - Tricuspid valve: Trivial regurgitation. - Inferior vena cava: The vessel was normal in size; the respirophasic diameter changes were in the normal range (= 50%); findings are consistent with normal central venous pressure. - Pericardium, extracardiac: There was no pericardial effusion.    Disposition and Follow-up:     Discharge Orders   Future Orders Complete By Expires   Diet - low sodium heart healthy  As directed    Discharge instructions  As directed    Increase activity slowly  As directed        DISPOSITION:  left AMA    Time spent on Discharge: 35 mins  Signed:   Ripudeep Jenna Luo M.D. [facility] 07/20/2013, 4:43 PM Pager:  [phone]   **Disclaimer: This note was dictated with voice recognition software. Similar sounding words can inadvertently be transcribed and this note may contain transcription errors which may not have been corrected upon publication of note.**

## 2013-07-20 NOTE — Progress Notes (Signed)
Utilization review completed.  

## 2013-07-20 NOTE — Progress Notes (Signed)
  Echocardiogram 2D Echocardiogram has been performed.  Phillip [name] 07/20/2013, 11:00 AM

## 2013-07-20 NOTE — Progress Notes (Signed)
Patient seen and examined, admitted by Dr. Roger Shelter this morning, history of chronic back pain from accident in April 2015, CHF, hypertension, admitted with sudden onset of severe headache, presyncope, chest pain.  BP 110/73  Pulse 53  Temp(Src) 97.2 F (36.2 C) (Oral)  Resp 18  Wt 101.424 kg (223 lb 9.6 oz)  SpO2 97%  Sudden headache with dizziness - MRI/MRA pending, neurology consulted, continue aspirin, neurochecks   Chest pain: Still having 2/10 chest pain - EKG reviewed with Dr. Rennis Golden, ? Minimal ST elevations, T wave inversions with repolarization changes in V5 and V 6 - Continue aspirin, obtain lipid panel - Troponins negative so far, obtain 2-D echocardiogram - Dr. Rennis Golden will see the patient, recommended nuclear medicine stress test in a.m.    Ripudeep Jenna Luo M.D. [facility] 07/20/2013, 12:30 PM  Pager: [phone]

## 2013-07-20 NOTE — ED Notes (Signed)
Interpreter number [phone]

## 2013-07-20 NOTE — Progress Notes (Signed)
Patient left AMA. Pt wanted to follow up with his own doctors at [facility]. Pt verbalized he will ask his MD for an MRI and he verbalized that he will follow up with his cardiologist. Patient stable not experiencing pain or dizziness. Pt walked out with his transportation.

## 2013-07-20 NOTE — ED Notes (Signed)
Pt c/o dizziness, pain is better.

## 2013-07-20 NOTE — H&P (Signed)
[facility] History and Physical  Kien Mirsky WUJ:[id number] DOB: Mar 15, 1966 DOA: 07/19/2013  Referring physician: EDP PCP: No PCP Per Patient   Chief Complaint: Headache   HPI: Phillip [name] is a 48 y.o. male who presents to the ED with sudden onset of severe headache at work today.  He reported that he didn't feel good and then collapsed.  No LOC.  Patient was brought to the ED.  Headache throughout his head.  No associated nausea nor vomiting, does have dizziness which has persisted even after his headache is controlled with morphine and a headache cocktail.  Review of Systems: Systems reviewed.  As above, otherwise negative  Past Medical History  Diagnosis Date  . CHF (congestive heart failure)   . Hypertension    History reviewed. No pertinent past surgical history. Social History:  has no tobacco, alcohol, and drug history on file.  No Known Allergies  History reviewed. No pertinent family history.   Prior to Admission medications   Not on File   Physical Exam: Filed Vitals:   07/20/13 0227  BP: 118/61  Pulse: 63  Temp: 97.5 F (36.4 C)  Resp: 16    BP 118/61  Pulse 63  Temp(Src) 97.5 F (36.4 C) (Oral)  Resp 16  Wt 101.424 kg (223 lb 9.6 oz)  SpO2 96%  General Appearance:    Alert, oriented, no distress, appears stated age  Head:    Normocephalic, atraumatic  Eyes:    PERRL, EOMI, sclera non-icteric        Nose:   Nares without drainage or epistaxis. Mucosa, turbinates normal  Throat:   Moist mucous membranes. Oropharynx without erythema or exudate.  Neck:   Supple. No carotid bruits.  No thyromegaly.  No lymphadenopathy.   Back:     No CVA tenderness, no spinal tenderness  Lungs:     Clear to auscultation bilaterally, without wheezes, rhonchi or rales  Chest wall:    No tenderness to palpitation  Heart:    Regular rate and rhythm without murmurs, gallops, rubs  Abdomen:     Soft, non-tender, nondistended, normal bowel sounds, no  organomegaly  Genitalia:    deferred  Rectal:    deferred  Extremities:   No clubbing, cyanosis or edema.  Pulses:   2+ and symmetric all extremities  Skin:   Skin color, texture, turgor normal, no rashes or lesions  Lymph nodes:   Cervical, supraclavicular, and axillary nodes normal  Neurologic:   CNII-XII intact. Normal strength, sensation and reflexes      throughout    Labs on Admission:  Basic Metabolic Panel:  Recent Labs Lab 07/19/13 2155  NA 143  K 3.6*  CL 104  CO2 24  GLUCOSE 96  BUN 30*  CREATININE 1.10  CALCIUM 9.2   Liver Function Tests:  Recent Labs Lab 07/19/13 2155  AST 23  ALT 38  ALKPHOS 69  BILITOT 0.2*  PROT 8.0  ALBUMIN 3.8   No results found for this basename: LIPASE, AMYLASE,  in the last 168 hours No results found for this basename: AMMONIA,  in the last 168 hours CBC:  Recent Labs Lab 07/19/13 2155  WBC 9.9  NEUTROABS 5.4  HGB 14.2  HCT 40.7  MCV 84.4  PLT 323   Cardiac Enzymes:  Recent Labs Lab 07/19/13 2155  TROPONINI <0.30    BNP (last 3 results)  Recent Labs  07/19/13 2155  PROBNP 83.8   CBG: No results found for this basename: GLUCAP,  in the last 168 hours  Radiological Exams on Admission: Ct Head Wo Contrast  07/19/2013   CLINICAL DATA:  Headache.  EXAM: CT HEAD WITHOUT CONTRAST  TECHNIQUE: Contiguous axial images were obtained from the base of the skull through the vertex without intravenous contrast.  COMPARISON:  None.  FINDINGS: There is no evidence of acute infarction, mass lesion, or intra- or extra-axial hemorrhage on CT.  Evaluation is mildly suboptimal due to motion artifact.  The posterior fossa, including the cerebellum, brainstem and fourth ventricle, is within normal limits. The third and lateral ventricles, and basal ganglia are unremarkable in appearance. The cerebral hemispheres are symmetric in appearance, with normal gray-white differentiation. No mass effect or midline shift is seen.  There is  no evidence of fracture; visualized osseous structures are unremarkable in appearance. The orbits are within normal limits. Mucus retention cysts or polyps are seen within the maxillary sinuses bilaterally. The remaining paranasal sinuses and mastoid air cells are well-aerated. No significant soft tissue abnormalities are seen.  IMPRESSION: 1. No acute intracranial pathology seen on CT. 2. Mucus retention cysts or polyps seen within the maxillary sinuses bilaterally.   Electronically Signed   By: Roanna Raider M.D.   On: 07/19/2013 23:24   Dg Chest Portable 1 View  07/19/2013   CLINICAL DATA:  Chest pain and headache.  EXAM: PORTABLE CHEST - 1 VIEW  COMPARISON:  None.  FINDINGS: The lungs are well-aerated and clear. There is no evidence of focal opacification, pleural effusion or pneumothorax.  The cardiomediastinal silhouette is borderline enlarged. No acute osseous abnormalities are seen.  IMPRESSION: No acute cardiopulmonary process seen.  Borderline cardiomegaly.   Electronically Signed   By: Roanna Raider M.D.   On: 07/19/2013 22:58   Ct Angio Chest Aortic Dissect W &/or W/o  07/20/2013   CLINICAL DATA:  Collapse without loss of consciousness.  EXAM: CT ANGIOGRAPHY CHEST, ABDOMEN  TECHNIQUE: Multidetector CT imaging through the chest and abdomen was performed using the standard protocol during bolus administration of intravenous contrast. Multiplanar reconstructed images and MIPs were obtained and reviewed to evaluate the vascular anatomy.  CONTRAST:  OMNIPAQUE IOHEXOL 350 MG/ML SOLN  COMPARISON:  DG CHEST 1V PORT dated 07/19/2013  FINDINGS: CTA CHEST FINDINGS  The caliber of the thoracic aorta is normal. There is no evidence of a false lumen. There is no periaortic hematoma. The cardiac chambers are mildly enlarged. There is no pericardial effusion. There are coronary artery calcifications demonstrated. There is no mediastinal nor hilar lymphadenopathy. The thoracic esophagus is normal in caliber.  Contrast within the pulmonary arterial tree is normal in appearance.  At lung window settings there is no interstitial or alveolar infiltrate. There is subsegmental atelectasis in the dependent portion of both lungs. There is no pneumothorax or pneumomediastinum or pleural effusion. The bony thorax exhibits no acute abnormalities.  Review of the MIP images confirms the above findings.  CTA ABDOMEN FINDINGS  The caliber of the abdominal aorta is normal. There is no evidence of a false lumen. The proximal common iliac arteries exhibit normal caliber. The celiac trunk, the SMA, the renal arteries, and the IMA fill well at their origins. There is no periaortic hematoma.  The liver exhibits decreased density diffusely consistent with fatty infiltration. The gallbladder, pancreas, spleen, and adrenal glands are normal in appearance. The kidneys exhibit no evidence of obstruction. There are multiple renal cortical hypodensities most compatible with cysts. The perinephric fat is normal in appearance. There are no calcified kidney  stones.  The visualized portions of the small and large bowel exhibit no evidence of ileus or obstruction. The visualized portions of the lumbar spine also appear normal.  Review of the MIP images confirms the above findings.  IMPRESSION: 1. There is no evidence of thoracic or abdominal aortic dissection. The caliber of the thoracic and abdominal aorta is normal. 2. There is mild enlargement of the cardiac chambers without overt evidence of pulmonary edema. 3. There is minimal dependent atelectasis in both lungs. 4. No acute visceral abnormality within the abdomen is demonstrated. 5. There are multiple hypodensities within both kidneys which likely reflect cysts. These will merit further evaluation with elective renal protocol MRI or renal ultrasound when the patient has recovered from his acute illness.   Electronically Signed   By: David  [place]   On: 07/20/2013 00:40   Ct Cta Abd/pel W/cm &/or  W/o Cm  07/20/2013   CLINICAL DATA:  Collapse without loss of consciousness.  EXAM: CT ANGIOGRAPHY CHEST, ABDOMEN  TECHNIQUE: Multidetector CT imaging through the chest and abdomen was performed using the standard protocol during bolus administration of intravenous contrast. Multiplanar reconstructed images and MIPs were obtained and reviewed to evaluate the vascular anatomy.  CONTRAST:  100mL OMNIPAQUE IOHEXOL 350 MG/ML SOLN  COMPARISON:  DG CHEST 1V PORT dated 07/19/2013  FINDINGS: CTA CHEST FINDINGS  The caliber of the thoracic aorta is normal. There is no evidence of a false lumen. There is no periaortic hematoma. The cardiac chambers are mildly enlarged. There is no pericardial effusion. There are coronary artery calcifications demonstrated. There is no mediastinal nor hilar lymphadenopathy. The thoracic esophagus is normal in caliber. Contrast within the pulmonary arterial tree is normal in appearance.  At lung window settings there is no interstitial or alveolar infiltrate. There is subsegmental atelectasis in the dependent portion of both lungs. There is no pneumothorax or pneumomediastinum or pleural effusion. The bony thorax exhibits no acute abnormalities.  Review of the MIP images confirms the above findings.  CTA ABDOMEN FINDINGS  The caliber of the abdominal aorta is normal. There is no evidence of a false lumen. The proximal common iliac arteries exhibit normal caliber. The celiac trunk, the SMA, the renal arteries, and the IMA fill well at their origins. There is no periaortic hematoma.  The liver exhibits decreased density diffusely consistent with fatty infiltration. The gallbladder, pancreas, spleen, and adrenal glands are normal in appearance. The kidneys exhibit no evidence of obstruction. There are multiple renal cortical hypodensities most compatible with cysts. The perinephric fat is normal in appearance. There are no calcified kidney stones.  The visualized portions of the small and large  bowel exhibit no evidence of ileus or obstruction. The visualized portions of the lumbar spine also appear normal.  Review of the MIP images confirms the above findings.  IMPRESSION: 1. There is no evidence of thoracic or abdominal aortic dissection. The caliber of the thoracic and abdominal aorta is normal. 2. There is mild enlargement of the cardiac chambers without overt evidence of pulmonary edema. 3. There is minimal dependent atelectasis in both lungs. 4. No acute visceral abnormality within the abdomen is demonstrated. 5. There are multiple hypodensities within both kidneys which likely reflect cysts. These will merit further evaluation with elective renal protocol MRI or renal ultrasound when the patient has recovered from his acute illness.   Electronically Signed   By: David  [place]   On: 07/20/2013 00:40    EKG: Independently reviewed.  Assessment/Plan Active Problems:  Vertigo   Headache(784.0)   Headache   1. Vertigo and headache - CT head negative for bleed, no focal neurologic findings at this time.  Concern for posterior circulation given vertigo per neurology, MRI/MRA ordered in AM as this has persisted despite improvement in headache.  Tele monitoring and ASA 325 daily ordered.  q2h neuro checks ordered.   Code Status: Full Code  Family Communication: No family in room Disposition Plan: Admit to inpatient   Time spent: 50 min  Hillary Bow [facility] Pager [phone]  If 7AM-7PM, please contact the day team taking care of the patient [email or web address] Password [facility] 07/20/2013, 2:53 AM

## 2013-07-20 NOTE — Consult Note (Signed)
CONSULTATION NOTE  Reason for Consult: Chest pain  Requesting Physician: Dr. Tana Coast  Cardiologist: None (New)  HPI: This is a 48 y.o. male with a past medical history significant for ? Congestive heart failure, who presents with presyncope and possible chest pain which he rates a 2/10.  An EKG performed in the ER shows mild J point elevation across the precordium, voltage criteria for LVH and anterolateral TWI's.  He also has a history of hypertension. He has significant musculoskeletal neck and back pain as well. Apparently, he recently underwent a nuclear stress test at [facility] for pre-operative work-up (however, I cannot find records of this). This was negative for ischemia. He had an echocardiogram today which shows preserved EF of 50-55% with incoordinate septal motion, but normal diastolic function. I was asked to consult regarding his chest pain.  PMHx:  Past Medical History  Diagnosis Date  . CHF (congestive heart failure)   . Hypertension   . Chronic back pain     "neck down" (07/20/2013)   Past Surgical History  Procedure Laterality Date  . Eye surgery Right   . Corneal transplant Right ~ 2010  . Cataract extraction w/ intraocular lens implant Right 02/16/2014  . Cardiac catheterization  2014?    FAMHx: History reviewed. No pertinent family history.  SOCHx:  reports that he has never smoked. He has never used smokeless tobacco. He reports that he does not drink alcohol or use illicit drugs.  ALLERGIES: No Known Allergies  ROS: Review of systems not obtained due to patient factors.  HOME MEDICATIONS: Prescriptions prior to admission  Medication Sig Dispense Refill  . benazepril-hydrochlorthiazide (LOTENSIN HCT) 20-12.5 MG per tablet Take 1 tablet by mouth daily.      . carvedilol (COREG) 25 MG tablet Take 25 mg by mouth 2 (two) times daily with a meal.      . enalapril (VASOTEC) 20 MG tablet Take 20 mg by mouth daily.      . furosemide (LASIX) 20 MG  tablet Take 20 mg by mouth daily.       . prednisoLONE acetate (PRED FORTE) 1 % ophthalmic suspension Place 1 drop into the right eye 3 (three) times daily.      . ranitidine (ZANTAC) 150 MG tablet Take 150 mg by mouth at bedtime.        HOSPITAL MEDICATIONS: Prior to Admission:  Prescriptions prior to admission  Medication Sig Dispense Refill  . benazepril-hydrochlorthiazide (LOTENSIN HCT) 20-12.5 MG per tablet Take 1 tablet by mouth daily.      . carvedilol (COREG) 25 MG tablet Take 25 mg by mouth 2 (two) times daily with a meal.      . enalapril (VASOTEC) 20 MG tablet Take 20 mg by mouth daily.      . furosemide (LASIX) 20 MG tablet Take 20 mg by mouth daily.       . prednisoLONE acetate (PRED FORTE) 1 % ophthalmic suspension Place 1 drop into the right eye 3 (three) times daily.      . ranitidine (ZANTAC) 150 MG tablet Take 150 mg by mouth at bedtime.        VITALS: Blood pressure 121/69, pulse 78, temperature 98.4 F (36.9 C), temperature source Oral, resp. rate 22, weight 223 lb 9.6 oz (101.424 kg), SpO2 98.00%.  PHYSICAL EXAM: General appearance: alert and no distress Neck: no carotid bruit and no JVD Lungs: clear to auscultation bilaterally Heart: regular rate and rhythm, S1, S2 normal, no murmur, click,  rub or gallop Abdomen: soft, non-tender; bowel sounds normal; no masses,  no organomegaly Extremities: extremities normal, atraumatic, no cyanosis or edema Pulses: 2+ and symmetric Skin: Skin color, texture, turgor normal. No rashes or lesions Neurologic: Grossly normal Psych: Anxious  LABS: Results for orders placed during the hospital encounter of 07/19/13 (from the past 48 hour(s))  CBC WITH DIFFERENTIAL     Status: None   Collection Time    07/19/13  9:55 PM      Result Value Ref Range   WBC 9.9  4.0 - 10.5 K/uL   RBC 4.82  4.22 - 5.81 MIL/uL   Hemoglobin 14.2  13.0 - 17.0 g/dL   HCT 40.7  39.0 - 52.0 %   MCV 84.4  78.0 - 100.0 fL   MCH 29.5  26.0 - 34.0 pg    MCHC 34.9  30.0 - 36.0 g/dL   RDW 14.2  11.5 - 15.5 %   Platelets 323  150 - 400 K/uL   Neutrophils Relative % 56  43 - 77 %   Neutro Abs 5.4  1.7 - 7.7 K/uL   Lymphocytes Relative 34  12 - 46 %   Lymphs Abs 3.4  0.7 - 4.0 K/uL   Monocytes Relative 8  3 - 12 %   Monocytes Absolute 0.8  0.1 - 1.0 K/uL   Eosinophils Relative 2  0 - 5 %   Eosinophils Absolute 0.2  0.0 - 0.7 K/uL   Basophils Relative 0  0 - 1 %   Basophils Absolute 0.0  0.0 - 0.1 K/uL  COMPREHENSIVE METABOLIC PANEL     Status: Abnormal   Collection Time    07/19/13  9:55 PM      Result Value Ref Range   Sodium 143  137 - 147 mEq/L   Potassium 3.6 (*) 3.7 - 5.3 mEq/L   Chloride 104  96 - 112 mEq/L   CO2 24  19 - 32 mEq/L   Glucose, Bld 96  70 - 99 mg/dL   BUN 30 (*) 6 - 23 mg/dL   Creatinine, Ser 1.10  0.50 - 1.35 mg/dL   Calcium 9.2  8.4 - 10.5 mg/dL   Total Protein 8.0  6.0 - 8.3 g/dL   Albumin 3.8  3.5 - 5.2 g/dL   AST 23  0 - 37 U/L   ALT 38  0 - 53 U/L   Alkaline Phosphatase 69  39 - 117 U/L   Total Bilirubin 0.2 (*) 0.3 - 1.2 mg/dL   GFR calc non Af Amer 77 (*) >90 mL/min   GFR calc Af Amer 89 (*) >90 mL/min   Comment: (NOTE)     The eGFR has been calculated using the CKD EPI equation.     This calculation has not been validated in all clinical situations.     eGFR's persistently <90 mL/min signify possible Chronic Kidney     Disease.  TROPONIN I     Status: None   Collection Time    07/19/13  9:55 PM      Result Value Ref Range   Troponin I <0.30  <0.30 ng/mL   Comment:            Due to the release kinetics of cTnI,     a negative result within the first hours     of the onset of symptoms does not rule out     myocardial infarction with certainty.     If myocardial infarction is  still suspected,     repeat the test at appropriate intervals.  PRO B NATRIURETIC PEPTIDE     Status: None   Collection Time    07/19/13  9:55 PM      Result Value Ref Range   Pro B Natriuretic peptide (BNP) 83.8  0 -  125 pg/mL  D-DIMER, QUANTITATIVE     Status: None   Collection Time    07/19/13  9:55 PM      Result Value Ref Range   D-Dimer, Quant <0.27  0.00 - 0.48 ug/mL-FEU   Comment:            AT THE INHOUSE ESTABLISHED CUTOFF     VALUE OF 0.48 ug/mL FEU,     THIS ASSAY HAS BEEN DOCUMENTED     IN THE LITERATURE TO HAVE     A SENSITIVITY AND NEGATIVE     PREDICTIVE VALUE OF AT LEAST     98 TO 99%.  THE TEST RESULT     SHOULD BE CORRELATED WITH     AN ASSESSMENT OF THE CLINICAL     PROBABILITY OF DVT / VTE.  TROPONIN I     Status: None   Collection Time    07/20/13 10:30 AM      Result Value Ref Range   Troponin I <0.30  <0.30 ng/mL   Comment:            Due to the release kinetics of cTnI,     a negative result within the first hours     of the onset of symptoms does not rule out     myocardial infarction with certainty.     If myocardial infarction is still suspected,     repeat the test at appropriate intervals.  BASIC METABOLIC PANEL     Status: Abnormal   Collection Time    07/20/13 10:30 AM      Result Value Ref Range   Sodium 143  137 - 147 mEq/L   Potassium 3.4 (*) 3.7 - 5.3 mEq/L   Chloride 104  96 - 112 mEq/L   CO2 27  19 - 32 mEq/L   Glucose, Bld 98  70 - 99 mg/dL   BUN 28 (*) 6 - 23 mg/dL   Creatinine, Ser 1.02  0.50 - 1.35 mg/dL   Calcium 8.7  8.4 - 10.5 mg/dL   GFR calc non Af Amer 84 (*) >90 mL/min   GFR calc Af Amer >90  >90 mL/min   Comment: (NOTE)     The eGFR has been calculated using the CKD EPI equation.     This calculation has not been validated in all clinical situations.     eGFR's persistently <90 mL/min signify possible Chronic Kidney     Disease.    IMAGING: Ct Head Wo Contrast  07/19/2013   CLINICAL DATA:  Headache.  EXAM: CT HEAD WITHOUT CONTRAST  TECHNIQUE: Contiguous axial images were obtained from the base of the skull through the vertex without intravenous contrast.  COMPARISON:  None.  FINDINGS: There is no evidence of acute infarction,  mass lesion, or intra- or extra-axial hemorrhage on CT.  Evaluation is mildly suboptimal due to motion artifact.  The posterior fossa, including the cerebellum, brainstem and fourth ventricle, is within normal limits. The third and lateral ventricles, and basal ganglia are unremarkable in appearance. The cerebral hemispheres are symmetric in appearance, with normal gray-white differentiation. No mass effect or midline shift is seen.  There is  no evidence of fracture; visualized osseous structures are unremarkable in appearance. The orbits are within normal limits. Mucus retention cysts or polyps are seen within the maxillary sinuses bilaterally. The remaining paranasal sinuses and mastoid air cells are well-aerated. No significant soft tissue abnormalities are seen.  IMPRESSION: 1. No acute intracranial pathology seen on CT. 2. Mucus retention cysts or polyps seen within the maxillary sinuses bilaterally.   Electronically Signed   By: Garald Balding M.D.   On: 07/19/2013 23:24   Dg Chest Portable 1 View  07/19/2013   CLINICAL DATA:  Chest pain and headache.  EXAM: PORTABLE CHEST - 1 VIEW  COMPARISON:  None.  FINDINGS: The lungs are well-aerated and clear. There is no evidence of focal opacification, pleural effusion or pneumothorax.  The cardiomediastinal silhouette is borderline enlarged. No acute osseous abnormalities are seen.  IMPRESSION: No acute cardiopulmonary process seen.  Borderline cardiomegaly.   Electronically Signed   By: Garald Balding M.D.   On: 07/19/2013 22:58   Ct Angio Chest Aortic Dissect W &/or W/o  07/20/2013   CLINICAL DATA:  Collapse without loss of consciousness.  EXAM: CT ANGIOGRAPHY CHEST, ABDOMEN  TECHNIQUE: Multidetector CT imaging through the chest and abdomen was performed using the standard protocol during bolus administration of intravenous contrast. Multiplanar reconstructed images and MIPs were obtained and reviewed to evaluate the vascular anatomy.  CONTRAST:  169m  OMNIPAQUE IOHEXOL 350 MG/ML SOLN  COMPARISON:  DG CHEST 1V PORT dated 07/19/2013  FINDINGS: CTA CHEST FINDINGS  The caliber of the thoracic aorta is normal. There is no evidence of a false lumen. There is no periaortic hematoma. The cardiac chambers are mildly enlarged. There is no pericardial effusion. There are coronary artery calcifications demonstrated. There is no mediastinal nor hilar lymphadenopathy. The thoracic esophagus is normal in caliber. Contrast within the pulmonary arterial tree is normal in appearance.  At lung window settings there is no interstitial or alveolar infiltrate. There is subsegmental atelectasis in the dependent portion of both lungs. There is no pneumothorax or pneumomediastinum or pleural effusion. The bony thorax exhibits no acute abnormalities.  Review of the MIP images confirms the above findings.  CTA ABDOMEN FINDINGS  The caliber of the abdominal aorta is normal. There is no evidence of a false lumen. The proximal common iliac arteries exhibit normal caliber. The celiac trunk, the SMA, the renal arteries, and the IMA fill well at their origins. There is no periaortic hematoma.  The liver exhibits decreased density diffusely consistent with fatty infiltration. The gallbladder, pancreas, spleen, and adrenal glands are normal in appearance. The kidneys exhibit no evidence of obstruction. There are multiple renal cortical hypodensities most compatible with cysts. The perinephric fat is normal in appearance. There are no calcified kidney stones.  The visualized portions of the small and large bowel exhibit no evidence of ileus or obstruction. The visualized portions of the lumbar spine also appear normal.  Review of the MIP images confirms the above findings.  IMPRESSION: 1. There is no evidence of thoracic or abdominal aortic dissection. The caliber of the thoracic and abdominal aorta is normal. 2. There is mild enlargement of the cardiac chambers without overt evidence of pulmonary  edema. 3. There is minimal dependent atelectasis in both lungs. 4. No acute visceral abnormality within the abdomen is demonstrated. 5. There are multiple hypodensities within both kidneys which likely reflect cysts. These will merit further evaluation with elective renal protocol MRI or renal ultrasound when the patient has recovered from his  acute illness.   Electronically Signed   By: David  Martinique   On: 07/20/2013 00:40   Ct Cta Abd/pel W/cm &/or W/o Cm  07/20/2013   CLINICAL DATA:  Collapse without loss of consciousness.  EXAM: CT ANGIOGRAPHY CHEST, ABDOMEN  TECHNIQUE: Multidetector CT imaging through the chest and abdomen was performed using the standard protocol during bolus administration of intravenous contrast. Multiplanar reconstructed images and MIPs were obtained and reviewed to evaluate the vascular anatomy.  CONTRAST:  158m OMNIPAQUE IOHEXOL 350 MG/ML SOLN  COMPARISON:  DG CHEST 1V PORT dated 07/19/2013  FINDINGS: CTA CHEST FINDINGS  The caliber of the thoracic aorta is normal. There is no evidence of a false lumen. There is no periaortic hematoma. The cardiac chambers are mildly enlarged. There is no pericardial effusion. There are coronary artery calcifications demonstrated. There is no mediastinal nor hilar lymphadenopathy. The thoracic esophagus is normal in caliber. Contrast within the pulmonary arterial tree is normal in appearance.  At lung window settings there is no interstitial or alveolar infiltrate. There is subsegmental atelectasis in the dependent portion of both lungs. There is no pneumothorax or pneumomediastinum or pleural effusion. The bony thorax exhibits no acute abnormalities.  Review of the MIP images confirms the above findings.  CTA ABDOMEN FINDINGS  The caliber of the abdominal aorta is normal. There is no evidence of a false lumen. The proximal common iliac arteries exhibit normal caliber. The celiac trunk, the SMA, the renal arteries, and the IMA fill well at their  origins. There is no periaortic hematoma.  The liver exhibits decreased density diffusely consistent with fatty infiltration. The gallbladder, pancreas, spleen, and adrenal glands are normal in appearance. The kidneys exhibit no evidence of obstruction. There are multiple renal cortical hypodensities most compatible with cysts. The perinephric fat is normal in appearance. There are no calcified kidney stones.  The visualized portions of the small and large bowel exhibit no evidence of ileus or obstruction. The visualized portions of the lumbar spine also appear normal.  Review of the MIP images confirms the above findings.  IMPRESSION: 1. There is no evidence of thoracic or abdominal aortic dissection. The caliber of the thoracic and abdominal aorta is normal. 2. There is mild enlargement of the cardiac chambers without overt evidence of pulmonary edema. 3. There is minimal dependent atelectasis in both lungs. 4. No acute visceral abnormality within the abdomen is demonstrated. 5. There are multiple hypodensities within both kidneys which likely reflect cysts. These will merit further evaluation with elective renal protocol MRI or renal ultrasound when the patient has recovered from his acute illness.   Electronically Signed   By: David  JMartinique  On: 07/20/2013 00:40    HOSPITAL DIAGNOSES: Active Problems:   Vertigo   Headache(784.0)   Headache   IMPRESSION: 1. Atypical chest pain 2. Abnormal EKG with LVH changes 3. Pre-syncope  RECOMMENDATION: Mr. Phillip Carawayis still having low grade chest discomfort which I suspect is musculoskeletal. Troponins were negative x 2 overnight. His echocardiogram today shows relatively preserved LV function without ischemic wall motion abnormalities and normal diastolic function. [facility] 10/2012, followed by a cardiac catheterization which showed normal coronaries.  Therefore, I'm more certain his chest pain is non-cardiac.  He now wishes to leave AMA.  I have advised him that he will likely be responsible for all hospital charges if he does this. He understands (  and this was translated to him in Spanish by an interpreter).  Cardiac follow-up with his cardiologist at [facility].  Time Spent Directly with Patient: 45 minutes  Pixie Casino, MD, [facility] Attending Cardiologist [place] 07/20/2013, 2:25 PM

## 2013-07-20 NOTE — Progress Notes (Signed)
New Admission:  Arrival Method: stretcher Mental Orientation: AOX4 Telemetry: placed Assessment: completed Skin: WNL IV: LFA NSL Pain: documented  Tubes: na  Safety Measures: initiated and explained to patient via interpreter  Admission:  [facility] Orientation: completed Family: na   Orders have been reviewed and implemented. Will continue to monitor the patient. Call light has been placed within reach and bed alarm has been activated.  [employer] BSN, RN-BC, [employer]

## 2013-07-20 NOTE — ED Notes (Signed)
Taken to ST with RN accompanyment

## 2013-07-20 NOTE — Consult Note (Signed)
Reason for Consult:Headache, dizziness Referring Physician: Rancour  CC: Headache, dizziness  HPI: Phillip [name] is an 48 y.o. male who was at work today and had the sudden onset of headache.  He reported that he did not feel good and then collapsed.  He did not lose consciousness.  Patient was brought to the ED at that time.  He reports that his headache is holocranial.  He has no associated nausea and vomiting but he does have dizziness.   Patient also describes chest pain and back pain.    Past Medical History  Diagnosis Date  . CHF (congestive heart failure)   . Hypertension     History reviewed. No pertinent past surgical history.  History reviewed. No pertinent family history.  Social History:  The patient reports that he does not smoke.  He drinks on occasion.  There is no history of illicit drug abuse.    No Known Allergies  Medications: I have reviewed the patient's current medications. Prior to Admission:  None.  ROS: History obtained from the patient  General ROS: negative for - chills, fatigue, fever, night sweats, weight gain or weight loss Psychological ROS: negative for - behavioral disorder, hallucinations, memory difficulties, mood swings or suicidal ideation Ophthalmic ROS: negative for - blurry vision, double vision, eye pain or loss of vision ENT ROS: negative for - epistaxis, nasal discharge, oral lesions, sore throat, tinnitus  Allergy and Immunology ROS: negative for - hives or itchy/watery eyes Hematological and Lymphatic ROS: negative for - bleeding problems, bruising or swollen lymph nodes Endocrine ROS: negative for - galactorrhea, hair pattern changes, polydipsia/polyuria or temperature intolerance Respiratory ROS: negative for - cough, hemoptysis, shortness of breath or wheezing Cardiovascular ROS: chest pain Gastrointestinal ROS: negative for - abdominal pain, diarrhea, hematemesis, nausea/vomiting or stool incontinence Genito-Urinary ROS:  negative for - dysuria, hematuria, incontinence or urinary frequency/urgency Musculoskeletal ROS: chronic back pain Neurological ROS: as noted in HPI Dermatological ROS: negative for rash and skin lesion changes  Physical Examination: Blood pressure 130/74, pulse 61, resp. rate 10, SpO2 96.00%.  Neurologic Examination Mental Status: Alert, oriented, thought content appropriate.  Speech fluent without evidence of aphasia.  Able to follow 3 step commands without difficulty. Cranial Nerves: II: Discs flat bilaterally; Visual fields grossly normal, pupils equal, round, reactive to light and accommodation III,IV, VI: ptosis not present, extra-ocular motions intact bilaterally V,VII: smile symmetric, facial light touch sensation normal bilaterally VIII: hearing normal bilaterally IX,X: gag reflex present XI: bilateral shoulder shrug XII: midline tongue extension Motor: Has difficulty lifting all extremities due to complaints of pain.  When encouraged can lift all extremities against gravity and shows no focality.   Sensory: Pinprick and light touch intact throughout, bilaterally Deep Tendon Reflexes: 2+ and symmetric throughout Plantars: Right: downgoing   Left: downgoing Cerebellar: normal finger-to-nose testing Gait: Unable to test CV: pulses palpable throughout     Laboratory Studies:   Basic Metabolic Panel:  Recent Labs Lab 07/19/13 2155  NA 143  K 3.6*  CL 104  CO2 24  GLUCOSE 96  BUN 30*  CREATININE 1.10  CALCIUM 9.2    Liver Function Tests:  Recent Labs Lab 07/19/13 2155  AST 23  ALT 38  ALKPHOS 69  BILITOT 0.2*  PROT 8.0  ALBUMIN 3.8   No results found for this basename: LIPASE, AMYLASE,  in the last 168 hours No results found for this basename: AMMONIA,  in the last 168 hours  CBC:  Recent Labs Lab 07/19/13 2155  WBC 9.9  NEUTROABS 5.4  HGB 14.2  HCT 40.7  MCV 84.4  PLT 323    Cardiac Enzymes:  Recent Labs Lab 07/19/13 2155   TROPONINI <0.30    BNP: No components found with this basename: POCBNP,   CBG: No results found for this basename: GLUCAP,  in the last 168 hours  Microbiology: No results found for this or any previous visit.  Coagulation Studies: No results found for this basename: LABPROT, INR,  in the last 72 hours  Urinalysis: No results found for this basename: COLORURINE, APPERANCEUR, LABSPEC, PHURINE, GLUCOSEU, HGBUR, BILIRUBINUR, KETONESUR, PROTEINUR, UROBILINOGEN, NITRITE, LEUKOCYTESUR,  in the last 168 hours  Lipid Panel:  No results found for this basename: chol, trig, hdl, cholhdl, vldl, ldlcalc    HgbA1C:  No results found for this basename: HGBA1C    Urine Drug Screen:   No results found for this basename: labopia, cocainscrnur, labbenz, amphetmu, thcu, labbarb    Alcohol Level: No results found for this basename: ETH,  in the last 168 hours  Other results: EKG: sinus rhythm at 76 bpm.  Imaging: Ct Head Wo Contrast  07/19/2013   CLINICAL DATA:  Headache.  EXAM: CT HEAD WITHOUT CONTRAST  TECHNIQUE: Contiguous axial images were obtained from the base of the skull through the vertex without intravenous contrast.  COMPARISON:  None.  FINDINGS: There is no evidence of acute infarction, mass lesion, or intra- or extra-axial hemorrhage on CT.  Evaluation is mildly suboptimal due to motion artifact.  The posterior fossa, including the cerebellum, brainstem and fourth ventricle, is within normal limits. The third and lateral ventricles, and basal ganglia are unremarkable in appearance. The cerebral hemispheres are symmetric in appearance, with normal gray-white differentiation. No mass effect or midline shift is seen.  There is no evidence of fracture; visualized osseous structures are unremarkable in appearance. The orbits are within normal limits. Mucus retention cysts or polyps are seen within the maxillary sinuses bilaterally. The remaining paranasal sinuses and mastoid air cells are  well-aerated. No significant soft tissue abnormalities are seen.  IMPRESSION: 1. No acute intracranial pathology seen on CT. 2. Mucus retention cysts or polyps seen within the maxillary sinuses bilaterally.   Electronically Signed   By: Roanna Raider M.D.   On: 07/19/2013 23:24   Dg Chest Portable 1 View  07/19/2013   CLINICAL DATA:  Chest pain and headache.  EXAM: PORTABLE CHEST - 1 VIEW  COMPARISON:  None.  FINDINGS: The lungs are well-aerated and clear. There is no evidence of focal opacification, pleural effusion or pneumothorax.  The cardiomediastinal silhouette is borderline enlarged. No acute osseous abnormalities are seen.  IMPRESSION: No acute cardiopulmonary process seen.  Borderline cardiomegaly.   Electronically Signed   By: Roanna Raider M.D.   On: 07/19/2013 22:58     Assessment/Plan: 48 year old male with multiple complaints.  He reports the most bothersome for him is his headache and dizziness.  He does not appear to have any focality on examination but he does not give full cooperation due to pain.  With syncope, headache and dizziness, the concern is for the posterior circulation.  The patient has already had contrast imaging tonight and will not be able to have a CTA at this time.  There is also a possibility though that the dizziness is related to his headache.  Head CT reviewed and shows no acute changes.   Recommendations: 1.  Headache cocktail.  Patient may very well get relief of dizziness with  relief of headache 2.  If dizziness persists despite improvement in headache would consider MRI/MRA of the brain without contrast.   3.  Frequent neuro checks 4.  Telemetry 5.  ASA 325mg  daily  Case discussed with Dr. Ottis Stainancour   Victoriano Campion, MD [facility] [phone] 07/20/2013, 12:37 AM

## 2013-07-21 NOTE — ED Provider Notes (Signed)
I saw and evaluated the patient, reviewed the resident's note and I agree with the findings and plan. If applicable, I agree with the resident's interpretation of the EKG.  If applicable, I was present for critical portions of any procedures performed.  Headache and chest pain that onset acutely while at work today. No LOC.  EKG with J point elevation, d/w Dr. Allyson Sabal who feels not STEMI and more consistent with LVH.  Poor effort on neuro exam with equal weakness in arms and legs. Patient states dizziness. Sudden onset headache concerning for possible SAH. CT negative within 6 hours of onset.no evidence of aortic dissection.  Seen by neurology who recommends migraine cocktail but patient will need posterior circulation evaluation.  Glynn Octave, MD 07/21/13 1006

## 2013-07-24 ENCOUNTER — Emergency Department (HOSPITAL_COMMUNITY): Payer: Self-pay

## 2013-07-24 ENCOUNTER — Encounter (HOSPITAL_COMMUNITY): Payer: Self-pay | Admitting: Emergency Medicine

## 2013-07-24 ENCOUNTER — Emergency Department (HOSPITAL_COMMUNITY)
Admission: EM | Admit: 2013-07-24 | Discharge: 2013-07-25 | Disposition: A | Discharge status: Home / Self Care | Payer: Self-pay | Attending: Emergency Medicine | Admitting: Emergency Medicine

## 2013-07-24 DIAGNOSIS — I10 Essential (primary) hypertension: Secondary | ICD-10-CM | POA: Insufficient documentation

## 2013-07-24 DIAGNOSIS — Z7982 Long term (current) use of aspirin: Secondary | ICD-10-CM | POA: Insufficient documentation

## 2013-07-24 DIAGNOSIS — G8929 Other chronic pain: Secondary | ICD-10-CM | POA: Insufficient documentation

## 2013-07-24 DIAGNOSIS — IMO0002 Reserved for concepts with insufficient information to code with codable children: Secondary | ICD-10-CM | POA: Insufficient documentation

## 2013-07-24 DIAGNOSIS — Z79899 Other long term (current) drug therapy: Secondary | ICD-10-CM | POA: Insufficient documentation

## 2013-07-24 DIAGNOSIS — R079 Chest pain, unspecified: Secondary | ICD-10-CM | POA: Insufficient documentation

## 2013-07-24 DIAGNOSIS — F329 Major depressive disorder, single episode, unspecified: Secondary | ICD-10-CM | POA: Insufficient documentation

## 2013-07-24 DIAGNOSIS — R51 Headache: Secondary | ICD-10-CM | POA: Insufficient documentation

## 2013-07-24 DIAGNOSIS — F449 Dissociative and conversion disorder, unspecified: Secondary | ICD-10-CM | POA: Insufficient documentation

## 2013-07-24 DIAGNOSIS — F4320 Adjustment disorder, unspecified: Secondary | ICD-10-CM

## 2013-07-24 DIAGNOSIS — Z9889 Other specified postprocedural states: Secondary | ICD-10-CM | POA: Insufficient documentation

## 2013-07-24 DIAGNOSIS — I509 Heart failure, unspecified: Secondary | ICD-10-CM | POA: Insufficient documentation

## 2013-07-24 LAB — COMPREHENSIVE METABOLIC PANEL
ALBUMIN: 4.2 g/dL (ref 3.5–5.2)
ALT: 43 U/L (ref 0–53)
AST: 27 U/L (ref 0–37)
Alkaline Phosphatase: 69 U/L (ref 39–117)
BUN: 27 mg/dL — ABNORMAL HIGH (ref 6–23)
CALCIUM: 10.3 mg/dL (ref 8.4–10.5)
CO2: 25 mEq/L (ref 19–32)
Chloride: 98 mEq/L (ref 96–112)
Creatinine, Ser: 1.05 mg/dL (ref 0.50–1.35)
GFR calc Af Amer: >90 mL/min (ref >90)
GFR calc non Af Amer: 81 mL/min — ABNORMAL LOW (ref >90)
Glucose, Bld: 88 mg/dL (ref 70–99)
Potassium: 3.3 mEq/L — ABNORMAL LOW (ref 3.7–5.3)
SODIUM: 140 meq/L (ref 137–147)
TOTAL PROTEIN: 8.8 g/dL — AB (ref 6.0–8.3)
Total Bilirubin: 0.4 mg/dL (ref 0.3–1.2)

## 2013-07-24 LAB — CBC WITH DIFFERENTIAL/PLATELET
BASOS PCT: 0 % (ref 0–1)
Basophils Absolute: 0 10*3/uL (ref 0.0–0.1)
EOS ABS: 0.2 10*3/uL (ref 0.0–0.7)
EOS PCT: 2 % (ref 0–5)
HCT: 44.8 % (ref 39.0–52.0)
Hemoglobin: 15.8 g/dL (ref 13.0–17.0)
LYMPHS ABS: 3.4 10*3/uL (ref 0.7–4.0)
Lymphocytes Relative: 31 % (ref 12–46)
MCH: 29.8 pg (ref 26.0–34.0)
MCHC: 35.3 g/dL (ref 30.0–36.0)
MCV: 84.4 fL (ref 78.0–100.0)
Monocytes Absolute: 0.8 10*3/uL (ref 0.1–1.0)
Monocytes Relative: 7 % (ref 3–12)
Neutro Abs: 6.6 10*3/uL (ref 1.7–7.7)
Neutrophils Relative %: 60 % (ref 43–77)
PLATELETS: 368 10*3/uL (ref 150–400)
RBC: 5.31 MIL/uL (ref 4.22–5.81)
RDW: 14.2 % (ref 11.5–15.5)
WBC: 11 10*3/uL — ABNORMAL HIGH (ref 4.0–10.5)

## 2013-07-24 LAB — ETHANOL

## 2013-07-24 LAB — I-STAT VENOUS BLOOD GAS, ED
Acid-Base Excess: 2 mmol/L (ref 0.0–2.0)
Bicarbonate: 25.3 mEq/L — ABNORMAL HIGH (ref 20.0–24.0)
O2 SAT: 87 %
PCO2 VEN: 35.3 mmHg — AB (ref 45.0–50.0)
PO2 VEN: 49 mmHg — AB (ref 30.0–45.0)
TCO2: 26 mmol/L (ref 0–100)
pH, Ven: 7.463 — ABNORMAL HIGH (ref 7.250–7.300)

## 2013-07-24 LAB — AMMONIA: Ammonia: 15 umol/L (ref 11–60)

## 2013-07-24 MED ORDER — METOCLOPRAMIDE HCL 5 MG/ML IJ SOLN
10.0000 mg | Freq: Once | INTRAMUSCULAR | Status: AC
  Administered 2013-07-24: 10 mg via INTRAVENOUS
  Filled 2013-07-24: qty 2

## 2013-07-24 MED ORDER — HYDROCHLOROTHIAZIDE 25 MG PO TABS: 25.0000 mg | ORAL_TABLET | Freq: Every day | ORAL | Status: DC

## 2013-07-24 MED ORDER — PREDNISOLONE ACETATE 1 % OP SUSP: 1.0000 [drp] | Freq: Three times a day (TID) | OPHTHALMIC | Status: DC

## 2013-07-24 MED ORDER — SODIUM CHLORIDE 0.9 % IV BOLUS (SEPSIS)
1000.0000 mL | Freq: Once | INTRAVENOUS | Status: AC
  Administered 2013-07-24: 1000 mL via INTRAVENOUS

## 2013-07-24 MED ORDER — ENALAPRIL MALEATE 20 MG PO TABS: 20.0000 mg | ORAL_TABLET | Freq: Every day | ORAL | Status: DC

## 2013-07-24 MED ORDER — DEXAMETHASONE SODIUM PHOSPHATE 10 MG/ML IJ SOLN
10.0000 mg | Freq: Once | INTRAMUSCULAR | Status: AC
  Administered 2013-07-24: 10 mg via INTRAVENOUS
  Filled 2013-07-24: qty 1

## 2013-07-24 MED ORDER — LORAZEPAM 2 MG/ML IJ SOLN
1.0000 mg | Freq: Once | INTRAMUSCULAR | Status: AC
  Administered 2013-07-24: 1 mg via INTRAVENOUS
  Filled 2013-07-24: qty 1

## 2013-07-24 MED ORDER — DIPHENHYDRAMINE HCL 50 MG/ML IJ SOLN
12.5000 mg | Freq: Once | INTRAMUSCULAR | Status: AC
  Administered 2013-07-24: 12.5 mg via INTRAVENOUS
  Filled 2013-07-24: qty 1

## 2013-07-24 MED ORDER — ASPIRIN EC 81 MG PO TBEC: 81.0000 mg | DELAYED_RELEASE_TABLET | Freq: Every day | ORAL | Status: DC

## 2013-07-24 MED ORDER — BENAZEPRIL-HYDROCHLOROTHIAZIDE 20-12.5 MG PO TABS: 1.0000 | ORAL_TABLET | Freq: Every day | ORAL | Status: DC

## 2013-07-24 MED ORDER — FUROSEMIDE 20 MG PO TABS: 20.0000 mg | ORAL_TABLET | Freq: Every day | ORAL | Status: DC

## 2013-07-24 MED ORDER — CARVEDILOL 25 MG PO TABS: 25.0000 mg | ORAL_TABLET | Freq: Two times a day (BID) | ORAL | Status: DC

## 2013-07-24 MED ORDER — FAMOTIDINE 20 MG PO TABS: 10.0000 mg | ORAL_TABLET | Freq: Every day | ORAL | Status: DC

## 2013-07-24 NOTE — ED Notes (Signed)
Per [facility], pt was at home and started having a HA at 1430 and then started having chest pain, became confused and was clutching the middle of his chest. Pt does not speak english. Was recently at [facility] and admitted. Has a 20g to [facility]. Niece is currently with the patient and states his wife left him a week ago. Squinting eyes, repetitive speech, random speech. Multiple PVCs on 12 lead. ST elevation in leads V1-V4 but no reciprocal changes.

## 2013-07-24 NOTE — ED Notes (Signed)
Rec'd report. Pt not in room, over in MRI.  Friend came, will wait for him in pt room.

## 2013-07-24 NOTE — ED Provider Notes (Signed)
CSN: [id number]     Arrival date & time 07/24/13  1547 History   First MD Initiated Contact with Patient 07/24/13 1600     Chief Complaint  Patient presents with  . Altered Mental Status  . Headache  . Chest Pain     (Consider location/radiation/quality/duration/timing/severity/associated sxs/prior Treatment) HPI  Due to patient's upset emotional state, the following history is obtained by the niece at bedside as well as chart review:  This is a 48 y.o. male with PMH of CHF, hypertension, chronic pain, discharged 4 days ago for similar presentation, presenting with chest pain, headache, emotional lability, nonsensical speech. Onset about 45 minutes ago, at home.  Persistent. Headache cannot be localized or described. No meds taken. Unknown the pain radiates. Positive for chest pain, which also cannot be localized or described. He only repeats, in [place], "Doctor, help me. Doctor, help me."  Past Medical History  Diagnosis Date  . CHF (congestive heart failure)   . Hypertension   . Chronic back pain     "neck down" (07/20/2013)   Past Surgical History  Procedure Laterality Date  . Eye surgery Right   . Corneal transplant Right ~ 2010  . Cataract extraction w/ intraocular lens implant Right 02/16/2014  . Cardiac catheterization  2014?   No family history on file. History  Substance Use Topics  . Smoking status: Never Smoker   . Smokeless tobacco: Never Used  . Alcohol Use: No    Review of Systems  Unable to perform ROS  Patient not cooperative with questioning  Allergies  Review of patient's allergies indicates no known allergies.  Home Medications   Prior to Admission medications   Medication Sig Start Date End Date Taking? Authorizing Provider  aspirin EC 81 MG tablet Take 1 tablet (81 mg total) by mouth daily. 07/20/13   Ripudeep Jenna LuoK Rai, MD  atenolol (TENORMIN) 50 MG tablet Take 50 mg by mouth daily.    Historical Provider, MD  benazepril-hydrochlorthiazide  (LOTENSIN HCT) 20-12.5 MG per tablet Take 1 tablet by mouth daily.    Historical Provider, MD  carvedilol (COREG) 25 MG tablet Take 25 mg by mouth 2 (two) times daily with a meal.    Historical Provider, MD  enalapril (VASOTEC) 20 MG tablet Take 40 mg by mouth daily.    Historical Provider, MD  enalapril (VASOTEC) 20 MG tablet Take 20 mg by mouth daily.    Historical Provider, MD  furosemide (LASIX) 20 MG tablet Take 1 tablet (20 mg total) by mouth daily. 07/21/13   Ripudeep Jenna LuoK Rai, MD  hydrochlorothiazide (HYDRODIURIL) 25 MG tablet Take 25 mg by mouth daily.    Historical Provider, MD  prednisoLONE acetate (PRED FORTE) 1 % ophthalmic suspension Place 1 drop into the right eye 3 (three) times daily.    Historical Provider, MD  ranitidine (ZANTAC) 150 MG tablet Take 150 mg by mouth at bedtime.    Historical Provider, MD   BP 138/97  Pulse 70  Temp(Src) 99.2 F (37.3 C) (Rectal)  Resp 11  Ht 5\' 8"  (1.727 m)  Wt 223 lb (101.152 kg)  BMI 33.91 kg/m2  SpO2 98% Physical Exam  Constitutional: He is oriented to person, place, and time. He appears well-developed and well-nourished. No distress.  HENT:  Head: Normocephalic and atraumatic.  Mouth/Throat: No oropharyngeal exudate.  Eyes: Conjunctivae are normal. Pupils are equal, round, and reactive to light. No scleral icterus.  Neck: Normal range of motion. No tracheal deviation present. No thyromegaly present.  Cardiovascular: Normal rate, regular rhythm and normal heart sounds.  Exam reveals no gallop and no friction rub.   No murmur heard. Pulmonary/Chest: Effort normal and breath sounds normal. No stridor. No respiratory distress. He has no wheezes. He has no rales. He exhibits no tenderness.  Abdominal: Soft. He exhibits no distension and no mass. There is no tenderness. There is no rebound and no guarding.  Musculoskeletal: Normal range of motion. He exhibits no edema.  Neurological: He is alert and oriented to person, place, and time.   Reflex Scores:      Patellar reflexes are 2+ on the right side and 2+ on the left side. Negative for facial droop  Patient moves all 4 extremities  Negative for any unilateral weakness  Skin: Skin is warm and dry. He is not diaphoretic.    ED Course  Procedures (including critical care time) Labs Review Labs Reviewed  CBC WITH DIFFERENTIAL - Abnormal; Notable for the following:    WBC 11.0 (*)    All other components within normal limits  COMPREHENSIVE METABOLIC PANEL - Abnormal; Notable for the following:    Potassium 3.3 (*)    BUN 27 (*)    Total Protein 8.8 (*)    GFR calc non Af Amer 81 (*)    All other components within normal limits  I-STAT VENOUS BLOOD GAS, ED - Abnormal; Notable for the following:    pH, Ven 7.463 (*)    pCO2, Ven 35.3 (*)    pO2, Ven 49.0 (*)    Bicarbonate 25.3 (*)    All other components within normal limits  ETHANOL  AMMONIA  URINALYSIS, ROUTINE W REFLEX MICROSCOPIC  URINE RAPID DRUG SCREEN (HOSP PERFORMED)    Imaging Review Dg Chest 2 View  07/24/2013   CLINICAL DATA:  Chest pain.  EXAM: CHEST  2 VIEW  COMPARISON:  DG CHEST 1V PORT dated 11/15/2012  FINDINGS: Mediastinum and hilar structures are normal. Poor inspiration with mild basilar atelectasis. No pleural effusion or pneumothorax. Mild cardiomegaly. No acute bony abnormality.  IMPRESSION: Poor inspiration with mild bibasilar atelectasis.   Electronically Signed   By: Maisie Fus  Register   On: 07/24/2013 17:18   Ct Head Wo Contrast  07/24/2013   CLINICAL DATA:  Headache.  Altered mental status.  EXAM: CT HEAD WITHOUT CONTRAST  TECHNIQUE: Contiguous axial images were obtained from the base of the skull through the vertex without intravenous contrast.  COMPARISON:  None.  FINDINGS: Normal appearing cerebral hemispheres and posterior fossa structures. Normal size and position of the ventricles. No intracranial hemorrhage, mass lesion or CT evidence of acute infarction. Unremarkable bones. Left  maxillary sinus retention cyst.  IMPRESSION: No intracranial abnormality.   Electronically Signed   By: Gordan Payment M.D.   On: 07/24/2013 18:02   Mr Maxine Glenn Head Wo Contrast  07/24/2013   CLINICAL DATA:  Confusion and altered mental status.  Chest pain.  EXAM: MRI HEAD WITHOUT CONTRAST  MRA HEAD WITHOUT CONTRAST  TECHNIQUE: Multiplanar, multiecho pulse sequences of the brain and surrounding structures were obtained without intravenous contrast. Angiographic images of the head were obtained using MRA technique without contrast.  COMPARISON:  CT head earlier in the day  FINDINGS: MRI HEAD FINDINGS  No evidence for acute infarction, hemorrhage, mass lesion, hydrocephalus, or extra-axial fluid. Premature for age cerebral and cerebellar atrophy. Minimal subcortical and periventricular T2 and FLAIR hyperintensities, likely mild chronic microvascular ischemic change. No midline abnormalities. Flow voids are maintained throughout the carotid, basilar, and  right vertebral arteries. There are no areas of chronic hemorrhage. Visualized calvarium, skull base, and upper cervical osseous structures unremarkable. Scalp and extracranial soft tissues, orbits, sinuses, and mastoids show no acute process. Shotty cervical lymph nodes. Prior right cataract extraction. Good general agreement prior CT.  MRA HEAD FINDINGS  The internal carotid arteries are widely patent. The basilar artery is widely patent with right vertebral dominant/sole contributor to the basilar. Left vertebral does not show flow related enhancement and may be chronically occluded. There is no corresponding left cerebellar infarct however. There is no proximal stenosis of the anterior, middle, or posterior cerebral arteries. I am unable to visualize the left PICA but otherwise there is no cerebellar branch abnormality.  IMPRESSION: Motion degraded exam demonstrating no acute stroke or mass lesion. Mild small vessel disease is suspected. No large vessel intracranial  occlusion or flow-limiting stenosis.   Electronically Signed   By: Davonna Belling M.D.   On: 07/24/2013 20:53   Mr Brain Wo Contrast  07/24/2013   CLINICAL DATA:  Confusion and altered mental status.  Chest pain.  EXAM: MRI HEAD WITHOUT CONTRAST  MRA HEAD WITHOUT CONTRAST  TECHNIQUE: Multiplanar, multiecho pulse sequences of the brain and surrounding structures were obtained without intravenous contrast. Angiographic images of the head were obtained using MRA technique without contrast.  COMPARISON:  CT head earlier in the day  FINDINGS: MRI HEAD FINDINGS  No evidence for acute infarction, hemorrhage, mass lesion, hydrocephalus, or extra-axial fluid. Premature for age cerebral and cerebellar atrophy. Minimal subcortical and periventricular T2 and FLAIR hyperintensities, likely mild chronic microvascular ischemic change. No midline abnormalities. Flow voids are maintained throughout the carotid, basilar, and right vertebral arteries. There are no areas of chronic hemorrhage. Visualized calvarium, skull base, and upper cervical osseous structures unremarkable. Scalp and extracranial soft tissues, orbits, sinuses, and mastoids show no acute process. Shotty cervical lymph nodes. Prior right cataract extraction. Good general agreement prior CT.  MRA HEAD FINDINGS  The internal carotid arteries are widely patent. The basilar artery is widely patent with right vertebral dominant/sole contributor to the basilar. Left vertebral does not show flow related enhancement and may be chronically occluded. There is no corresponding left cerebellar infarct however. There is no proximal stenosis of the anterior, middle, or posterior cerebral arteries. I am unable to visualize the left PICA but otherwise there is no cerebellar branch abnormality.  IMPRESSION: Motion degraded exam demonstrating no acute stroke or mass lesion. Mild small vessel disease is suspected. No large vessel intracranial occlusion or flow-limiting stenosis.    Electronically Signed   By: Davonna Belling M.D.   On: 07/24/2013 20:53     EKG Interpretation None      MDM   Final diagnoses:  None    This is a 48 y.o. male with PMH of CHF, hypertension, chronic pain, discharged 4 days ago for similar presentation, presenting with chest pain, headache, emotional lability, nonsensical speech. Onset about 45 minutes ago, at home.  Persistent. Headache cannot be localized or described. No meds taken. Unknown the pain radiates. Positive for chest pain, which also cannot be localized or described. He only repeats, in [place], "Doctor, help me. Doctor, help me."  Patient was just discharged late last month after being workup for sudden headache, similar to this presentation. He also had chest pain at that time, as he does at this time. CT of the head was negative for acute stroke or any acute intracranial abnormality. Neurology was consult it, noted no neurologic deficits.  Considering dizziness, headache, posterior stroke was obviously a consideration. Migraine cocktail was given, MRI MRA was recommended by neurology. CT angiogram of chest, abdomen, pelvis were without dissection or any acute abnormality. On chart review patient has normal heart cath August 2014. He was being prepared for discharge in the left AGAINST MEDICAL ADVICE.  Of note the patient's niece, at bedside, states that the patient's wife left him about a week ago and took his children. Understandably, he has been very emotionally distraught since then.  Exam reveals no gross neuro deficits.  EKG is without acute changes.  I have taken the patient to the CT scanner. There are no acute intracranial abnormalities. At this time, differential diagnosis is broad, and includes CVA, dissection, aneurysm, metabolic disorder, intoxication, withdrawal, infection, encephalitis, meningitis, conversion disorder, SAH, migraine headache. The patient remains stable. Will draw labs, perform MR I., MRA head.  Labs  do not reveal evidence of acidosis, hepatic encephalopathy, intoxication, electrolyte abnormality, infection. MR of the brain, MRA of the brain do not reveal acute intracranial process. Neurologic exam remains normal, with the exception of lack of patient participation. I believe that, considering recent life events, patient is suffering from a conversion disorder. I've consulted behavioral health. We appreciate their recommendations.  I have spoken with Lahoma Rocker with [facility] who states that the patient does not meet inpatient criteria.  I have reevaluated the patient, who refuses to cooperate with exam, refuses to stand.  I do not believe that he is appropriate for discharge. I still believe that this is likely due to conversion. I've contacted [facility] and requested psychiatric evaluation. She states that they will evaluate [name] in the morning. I appreciate their recommendations. I placed home medication orders as well as diet orders. The patient remains stable.   I have discussed case and care has been guided by my attending physician, Dr. Lynelle Doctor.  Loma Boston, MD 07/25/13 [phone]

## 2013-07-24 NOTE — ED Notes (Signed)
PT NOT ABLE TO COME FOR CT@1615 -[facility] TO CALL WHEN READY

## 2013-07-24 NOTE — BH Assessment (Signed)
Received a call for a tele-assessment. Spoke with Dr. Clearance Coots who stated that pt is a 48 year old male from [place]. Pt is hysterical complaining of headaches and chest pain. Pt labs are normal. Pt wife left him one week ago and took the children and has been dragging his name through the mud. It has been reported that earlier today patient had some friends to come by and visit and patient reported that he did not recognize them. Tele-assessment will be initiated.

## 2013-07-24 NOTE — ED Provider Notes (Signed)
I saw and evaluated the patient, reviewed the resident's note and I agree with the findings and plan.  No acute changes on EKG.  Patient has been under stress recently. His wife left him and put his shoulder in. Patient was in the emergency department last week for similar symptoms. He was confused experiencing severe chest pain and headache. Patient had a CT scan of the head as well as a CT angiogram of the chest abdomen pelvis. No acute abnormalities were noted.  Patient was admitted to the hospital for further treatment. MRI/MRA was recommended. The patient up leaving AMA.    The patient once again presents with similar symptoms. He keeps complaining dysuria headache but does not provide any additional history.  He also is complaining of chest pain. The patient is not speaking [place] and a relative is translating while he is here.  We will repeat labs and do a CT scan. Patient never had the MRI/MRA. We will proceed with that here in the emergency department today.  MRI was negative.  Suspect pt is having a stress reaction, possible conversion disorder.  Will consult psychiatry.  Celene Kras, MD 07/25/13 1535

## 2013-07-25 DIAGNOSIS — F4320 Adjustment disorder, unspecified: Secondary | ICD-10-CM

## 2013-07-25 LAB — RAPID URINE DRUG SCREEN, HOSP PERFORMED
AMPHETAMINES: NOT DETECTED
Barbiturates: NOT DETECTED
Benzodiazepines: NOT DETECTED
Cocaine: NOT DETECTED
Opiates: NOT DETECTED
TETRAHYDROCANNABINOL: NOT DETECTED

## 2013-07-25 LAB — URINE MICROSCOPIC-ADD ON

## 2013-07-25 LAB — URINALYSIS, ROUTINE W REFLEX MICROSCOPIC
Bilirubin Urine: NEGATIVE
GLUCOSE, UA: NEGATIVE mg/dL
HGB URINE DIPSTICK: NEGATIVE
KETONES UR: 15 mg/dL — AB
Leukocytes, UA: NEGATIVE
Nitrite: NEGATIVE
PROTEIN: 30 mg/dL — AB
Specific Gravity, Urine: 1.027 (ref 1.005–1.030)
Urobilinogen, UA: 0.2 mg/dL (ref 0.0–1.0)
pH: 5.5 (ref 5.0–8.0)

## 2013-07-25 NOTE — ED Provider Notes (Signed)
Patient has been evaluated by Alberteen Sam, Psychiatric NP. Patient denies SI and HI and does not wish to be hospitalized. He does not meet inpatient criteria. NP Link Snuffer recommended psychotropic medication but, the patient declined. The patient has been given a list of outpatient resources for follow up. His niece is here with him and will take him home, at his request.   Brandt Loosen, MD 07/25/13 [phone]

## 2013-07-25 NOTE — Consult Note (Signed)
Telepsych Consultation   Reason for Consult:  Referral for psychiatric evaluation Referring Physician:  EDP Rollene Fare, MD Resident Frances Furbish Phillip [name] is an 48 y.o. male.  Assessment: AXIS I:  Adjustment Disorder, Acute AXIS II:  Deferred AXIS III:   Past Medical History  Diagnosis Date  . CHF (congestive heart failure)   . Hypertension   . Chronic back pain     "neck down" (07/20/2013)   AXIS IV:  problems with primary support group and marital and familial problems AXIS V:  51-60 moderate symptoms  Plan:  No evidence of imminent risk to self or others at present.   Patient does not meet criteria for psychiatric inpatient admission. Supportive therapy provided about ongoing stressors.  Subjective:  This assessment was completed with assistance of translation services provided by [employer].  Phillip [name] is a 48 y.o. male patient who presented to [facility] with chest pain, headache, emotional lability, nonsensical speech. Patient states that he came to the ED because "I was having a headache." Patient is alert and oriented x 4. Currently states that he has a "light headache." Patient states "everything has been 'normal' with health except having a headache since Monday." Patient states that he is ambulatory at home, but that he does "walk slowly due to back pain" for which he sees a chiropractor. He saw chiropractor last week and states he uses "a rub on cream for back pain and an ice pack."   Patient reports recent stressful event of "my family left." He states that his wife left 2 weeks ago and took their 3 children and "moved out of town." He states that since that time "I feel tired and sad" and reports a decreased appetite.  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 For the past 2 weeks, "I hardly get sleepy and only get a few hours of sleep."  Patient denies suicidal or  homicidal ideations, intentions or plans stating "I have to take care of my children." Patient has no evidence of auditory or visual hallucinations, delusions and paranoia; states "I only hear God's voice." Discussed with patient about medication for anxiety and sleep.  Patient declined medication for both stating "No, I don't like pills. I don't want that because later on people become dependent."  Also discussed with patient about outpatient counseling and patient was agreeable to receiving resources for counseling. At the end of the assessment patient continued to decline medication stating "this will pass on its own."   HPI:  48 y.o. male patient who presented to [facility] with chest pain, headache, emotional lability, nonsensical speech. HPI Elements:   Location:  Mood. Quality:  Depressed. Severity:  Mild to moderate. Timing:  Daily. Duration:  Onset 2 weeks ago. Context:  Wife and children moved out of the home.  Past Psychiatric History: Past Medical History  Diagnosis Date  . CHF (congestive heart failure)   . Hypertension   . Chronic back pain     "neck down" (07/20/2013)    reports that he has never smoked. He has never used smokeless tobacco. He reports that he does not drink alcohol or use illicit drugs. No family history on file. Family History Substance Abuse: No Family Supports: Yes, List: (Children ) Living Arrangements: Alone Can pt return to current living arrangement?: Yes Allergies:  No Known Allergies  ACT Assessment Complete:  Yes:    Educational Status  Risk to Self: Risk to self Suicidal Ideation: No Suicidal Intent: No Is patient at risk for suicide?: No Suicidal Plan?: No Access to Means: No What has been your use of drugs/alcohol within the last 12 months?: None reported Previous Attempts/Gestures: No How many times?: 0 Other Self Harm Risks: N/A Triggers for Past Attempts: None known Intentional Self Injurious Behavior: None Family Suicide History:  No Recent stressful life event(s): Other (Comment) (Seperation from wife.) Persecutory voices/beliefs?: No Depression: No Substance abuse history and/or treatment for substance abuse?: No Suicide prevention information given to non-admitted patients: Not applicable  Risk to Others: Risk to Others Homicidal Ideation: No Thoughts of Harm to Others: No Current Homicidal Intent: No Current Homicidal Plan: No Access to Homicidal Means: No Identified Victim: N/A History of harm to others?: No Assessment of Violence: None Noted Violent Behavior Description: No violent behavior reported.  Does patient have access to weapons?: No Criminal Charges Pending?: No Does patient have a court date: No  Abuse: Abuse/Neglect Assessment (Assessment to be complete while patient is alone) Physical Abuse: Denies Verbal Abuse: Denies Sexual Abuse: Denies Exploitation of patient/patient's resources: Denies Self-Neglect: Denies  Prior Inpatient Therapy: Prior Inpatient Therapy Prior Inpatient Therapy: No  Prior Outpatient Therapy: Prior Outpatient Therapy Prior Outpatient Therapy: No  Additional Information: Additional Information 1:1 In Past 12 Months?: No CIRT Risk: No Elopement Risk: No Does patient have medical clearance?: No   Objective: Blood pressure 125/81, pulse 70, temperature 99.2 F (37.3 C), temperature source Rectal, resp. rate 13, height 5' 8" (1.727 m), weight 101.152 kg (223 lb), SpO2 99.00%.Body mass index is 33.91 kg/(m^2). Results for orders placed during the hospital encounter of 07/24/13 (from the past 72 hour(s))  CBC WITH DIFFERENTIAL     Status: Abnormal   Collection Time    07/24/13  4:06 PM      Result Value Ref Range   WBC 11.0 (*) 4.0 - 10.5 K/uL   RBC 5.31  4.22 - 5.81 MIL/uL   Hemoglobin 15.8  13.0 - 17.0 g/dL   HCT 44.8  39.0 - 52.0 %   MCV 84.4  78.0 - 100.0 fL   MCH 29.8  26.0 - 34.0 pg   MCHC 35.3  30.0 - 36.0 g/dL   RDW 14.2  11.5 - 15.5 %   Platelets 368   150 - 400 K/uL   Neutrophils Relative % 60  43 - 77 %   Neutro Abs 6.6  1.7 - 7.7 K/uL   Lymphocytes Relative 31  12 - 46 %   Lymphs Abs 3.4  0.7 - 4.0 K/uL   Monocytes Relative 7  3 - 12 %   Monocytes Absolute 0.8  0.1 - 1.0 K/uL   Eosinophils Relative 2  0 - 5 %   Eosinophils Absolute 0.2  0.0 - 0.7 K/uL   Basophils Relative 0  0 - 1 %   Basophils Absolute 0.0  0.0 - 0.1 K/uL  ETHANOL     Status: None   Collection Time    07/24/13  4:06 PM      Result Value Ref Range   Alcohol, Ethyl (B) <11  0 - 11 mg/dL   Comment:            LOWEST DETECTABLE LIMIT FOR     SERUM ALCOHOL IS 11 mg/dL     FOR MEDICAL PURPOSES ONLY  COMPREHENSIVE METABOLIC PANEL     Status: Abnormal   Collection Time    07/24/13  4:06   PM      Result Value Ref Range   Sodium 140  137 - 147 mEq/L   Potassium 3.3 (*) 3.7 - 5.3 mEq/L   Chloride 98  96 - 112 mEq/L   CO2 25  19 - 32 mEq/L   Glucose, Bld 88  70 - 99 mg/dL   BUN 27 (*) 6 - 23 mg/dL   Creatinine, Ser 1.05  0.50 - 1.35 mg/dL   Calcium 10.3  8.4 - 10.5 mg/dL   Total Protein 8.8 (*) 6.0 - 8.3 g/dL   Albumin 4.2  3.5 - 5.2 g/dL   AST 27  0 - 37 U/L   ALT 43  0 - 53 U/L   Alkaline Phosphatase 69  39 - 117 U/L   Total Bilirubin 0.4  0.3 - 1.2 mg/dL   GFR calc non Af Amer 81 (*) >90 mL/min   GFR calc Af Amer >90  >90 mL/min   Comment: (NOTE)     The eGFR has been calculated using the CKD EPI equation.     This calculation has not been validated in all clinical situations.     eGFR's persistently <90 mL/min signify possible Chronic Kidney     Disease.  AMMONIA     Status: None   Collection Time    07/24/13  4:07 PM      Result Value Ref Range   Ammonia 15  11 - 60 umol/L  I-STAT VENOUS BLOOD GAS, ED     Status: Abnormal   Collection Time    07/24/13  4:46 PM      Result Value Ref Range   pH, Ven 7.463 (*) 7.250 - 7.300   pCO2, Ven 35.3 (*) 45.0 - 50.0 mmHg   pO2, Ven 49.0 (*) 30.0 - 45.0 mmHg   Bicarbonate 25.3 (*) 20.0 - 24.0 mEq/L   TCO2 26   0 - 100 mmol/L   O2 Saturation 87.0     Acid-Base Excess 2.0  0.0 - 2.0 mmol/L   Sample type VENOUS    URINE RAPID DRUG SCREEN (HOSP PERFORMED)     Status: None   Collection Time    07/25/13  2:20 AM      Result Value Ref Range   Opiates NONE DETECTED  NONE DETECTED   Cocaine NONE DETECTED  NONE DETECTED   Benzodiazepines NONE DETECTED  NONE DETECTED   Amphetamines NONE DETECTED  NONE DETECTED   Tetrahydrocannabinol NONE DETECTED  NONE DETECTED   Barbiturates NONE DETECTED  NONE DETECTED   Comment:            DRUG SCREEN FOR MEDICAL PURPOSES     ONLY.  IF CONFIRMATION IS NEEDED     FOR ANY PURPOSE, NOTIFY LAB     WITHIN 5 DAYS.                LOWEST DETECTABLE LIMITS     FOR URINE DRUG SCREEN     Drug Class       Cutoff (ng/mL)     Amphetamine      1000     Barbiturate      200     Benzodiazepine   030     Tricyclics       131     Opiates          300     Cocaine          300     THC  50  URINALYSIS, ROUTINE W REFLEX MICROSCOPIC     Status: Abnormal   Collection Time    07/25/13  2:21 AM      Result Value Ref Range   Color, Urine YELLOW  YELLOW   APPearance CLEAR  CLEAR   Specific Gravity, Urine 1.027  1.005 - 1.030   pH 5.5  5.0 - 8.0   Glucose, UA NEGATIVE  NEGATIVE mg/dL   Hgb urine dipstick NEGATIVE  NEGATIVE   Bilirubin Urine NEGATIVE  NEGATIVE   Ketones, ur 15 (*) NEGATIVE mg/dL   Protein, ur 30 (*) NEGATIVE mg/dL   Urobilinogen, UA 0.2  0.0 - 1.0 mg/dL   Nitrite NEGATIVE  NEGATIVE   Leukocytes, UA NEGATIVE  NEGATIVE  URINE MICROSCOPIC-ADD ON     Status: None   Collection Time    07/25/13  2:21 AM      Result Value Ref Range   WBC, UA 0-2  <3 WBC/hpf   RBC / HPF 0-2  <3 RBC/hpf   Urine-Other MUCOUS PRESENT     Labs are reviewed and are pertinent for K+ 3.3, ph 7.463, pCO2 35.3, pO2 49, Bicarbonate 25.3, otherwise essentially unremarkable.   Current Facility-Administered Medications  Medication Dose Route Frequency Provider Last Rate Last  Dose  . aspirin EC tablet 81 mg  81 mg Oral Daily Doy Hutching, MD      . benazepril-hydrochlorthiazide (LOTENSIN HCT) 20-12.5 MG per tablet 1 tablet  1 tablet Oral Daily Doy Hutching, MD      . carvedilol (COREG) tablet 25 mg  25 mg Oral BID WC Doy Hutching, MD      . enalapril (VASOTEC) tablet 20 mg  20 mg Oral Daily Doy Hutching, MD      . famotidine (PEPCID) tablet 10 mg  10 mg Oral QHS Doy Hutching, MD      . furosemide (LASIX) tablet 20 mg  20 mg Oral Daily Doy Hutching, MD      . hydrochlorothiazide (HYDRODIURIL) tablet 25 mg  25 mg Oral Daily Doy Hutching, MD      . prednisoLONE acetate (PRED FORTE) 1 % ophthalmic suspension 1 drop  1 drop Right Eye TID Doy Hutching, MD       Current Outpatient Prescriptions  Medication Sig Dispense Refill  . aspirin EC 81 MG tablet Take 1 tablet (81 mg total) by mouth daily.  30 tablet  3  . atenolol (TENORMIN) 50 MG tablet Take 50 mg by mouth daily.      . benazepril-hydrochlorthiazide (LOTENSIN HCT) 20-12.5 MG per tablet Take 1 tablet by mouth daily.      . carvedilol (COREG) 25 MG tablet Take 25 mg by mouth 2 (two) times daily with a meal.      . enalapril (VASOTEC) 20 MG tablet Take 40 mg by mouth daily.      . enalapril (VASOTEC) 20 MG tablet Take 20 mg by mouth daily.      . furosemide (LASIX) 20 MG tablet Take 1 tablet (20 mg total) by mouth daily.  30 tablet    . hydrochlorothiazide (HYDRODIURIL) 25 MG tablet Take 25 mg by mouth daily.      . prednisoLONE acetate (PRED FORTE) 1 % ophthalmic suspension Place 1 drop into the right eye 3 (three) times daily.      . ranitidine (ZANTAC) 150 MG tablet Take 150 mg by mouth at bedtime.        Psychiatric Specialty Exam:     Blood pressure 125/81, pulse  70, temperature 99.2 F (37.3 C), temperature source Rectal, resp. rate 13, height 5' 8" (1.727 m), weight 101.152 kg (223 lb), SpO2 99.00%.Body mass index is 33.91 kg/(m^2).  General Appearance: Fairly Groomed  Eye  Contact::  Fair  Speech:  Clear and Coherent and Normal Rate  Volume:  Normal  Mood:  Depressed  Affect:  Congruent  Thought Process:  Coherent and Logical  Orientation:  Full (Time, Place, and Person)  Thought Content:  WDL  Suicidal Thoughts:  No  Homicidal Thoughts:  No  Memory:  Immediate;   Good Recent;   Good Remote;   Good  Judgement:  Good  Insight:  Fair  Psychomotor Activity:  Normal  Concentration:  Fair  Recall:  Good  Akathisia:  No  Handed:  Right  AIMS (if indicated):     Assets:  Desire for Improvement Vocational/Educational  Sleep:      Treatment Plan Summary: Discharge home with resources for outpatient counseling services.   Disposition: Patient may be discharged home from ED. Spoke with Dr. Manly at 0354 to update on plan of care.  Phillip Hobson, FNP-BC 07/25/2013 3:54 AM   Reviewed the information documented and agree with the treatment plan.  Janardhaha R Jonnalagadda 07/25/2013 1:39 PM        

## 2013-07-25 NOTE — ED Notes (Signed)
Pt brother at bedside, reports brother has been under a lot of stress, his wife took their children and left a week or two ago.  Pt has been experiencing a lot of health complaints and was hospitalized 1-2 weeks ago for same.

## 2013-07-25 NOTE — ED Notes (Signed)
Pt had friends visiting; he looked at them and asked them who they were per one of the visitors. Visitors stated they knew him because they all came [place].

## 2013-07-25 NOTE — BH Assessment (Signed)
Assessment Note  Phillip [name] is an 48 y.o. male presents to [facility] ED with complaints of headache and chest pain. Pt also reported that his right eye hurts and he has pain in his back. Pt reported that his wife left him approximately two weeks ago and took the children with her. Pt denies SI, HI, AH, VH at the present time. Pt did not report any hospitalizations or previous mental health treatment. Pt did not endorse any depressive symptoms and stated "God is hope" and "I believe in God". Pt reported that earlier today he saw some friends from his past that were already deceased. Pt reported that they died while in [place]. Pt reported that he lives alone and works full time. Pt did not report any issues with his sleep, appetite or weight changes. Pt stated that he works at [employer] therefore he less approximately 8 hours during the day time. Pt denied any illicit substance use at the present time. Pt did not   report any physical, sexual or emotional abuse.     Axis I: Adjustment Disorder NOS Axis II: Deferred Axis III:  Past Medical History  Diagnosis Date  . CHF (congestive heart failure)   . Hypertension   . Chronic back pain     "neck down" (07/20/2013)   Axis IV: problems with primary support group Axis V: 61-70 mild symptoms  Past Medical History:  Past Medical History  Diagnosis Date  . CHF (congestive heart failure)   . Hypertension   . Chronic back pain     "neck down" (07/20/2013)    Past Surgical History  Procedure Laterality Date  . Eye surgery Right   . Corneal transplant Right ~ 2010  . Cataract extraction w/ intraocular lens implant Right 02/16/2014  . Cardiac catheterization  2014?    Family History: No family history on file.  Social History:  reports that he has never smoked. He has never used smokeless tobacco. He reports that he does not drink alcohol or use illicit drugs.  Additional Social History:  Alcohol / Drug Use History of alcohol / drug  use?: No history of alcohol / drug abuse  CIWA: CIWA-Ar BP: 138/97 mmHg Pulse Rate: 70 COWS:    Allergies: No Known Allergies  Home Medications:  (Not in a hospital admission)  OB/GYN Status:  No LMP for male patient.  General Assessment Data Location of Assessment: [facility] ED Is this a Tele or Face-to-Face Assessment?: Tele Assessment Is this an Initial Assessment or a Re-assessment for this encounter?: Initial Assessment Living Arrangements: Alone Can pt return to current living arrangement?: Yes Admission Status: Voluntary Is patient capable of signing voluntary admission?: Yes Transfer from: Acute Hospital Referral Source: Self/Family/Friend     [facility] Crisis Care Plan Living Arrangements: Alone Name of Psychiatrist: N/A Name of Therapist: N/A  Education Status Is patient currently in school?: No  Risk to self Suicidal Ideation: No Suicidal Intent: No Is patient at risk for suicide?: No Suicidal Plan?: No Access to Means: No What has been your use of drugs/alcohol within the last 12 months?: None reported Previous Attempts/Gestures: No How many times?: 0 Other Self Harm Risks: N/A Triggers for Past Attempts: None known Intentional Self Injurious Behavior: None Family Suicide History: No Recent stressful life event(s): Other (Comment) (Seperation from wife.) Persecutory voices/beliefs?: No Depression: No Substance abuse history and/or treatment for substance abuse?: No Suicide prevention information given to non-admitted patients: Not applicable  Risk to Others Homicidal Ideation: No Thoughts of Harm  to Others: No Current Homicidal Intent: No Current Homicidal Plan: No Access to Homicidal Means: No Identified Victim: N/A History of harm to others?: No Assessment of Violence: None Noted Violent Behavior Description: No violent behavior reported.  Does patient have access to weapons?: No Criminal Charges Pending?: No Does patient have a court date:  No  Psychosis Hallucinations: None noted Delusions: None noted  Mental Status Report Appear/Hygiene: Other (Comment) [facility](Hospital gown ) Eye Contact: Fair Motor Activity: Freedom of movement Speech: Logical/coherent Level of Consciousness: Quiet/awake Mood: Anxious Affect: Appropriate to circumstance Anxiety Level: None Thought Processes: Coherent;Relevant Judgement: Unimpaired Orientation: Appropriate for developmental age Obsessive Compulsive Thoughts/Behaviors: None  Cognitive Functioning Concentration: Normal Memory: Recent Intact;Remote Intact IQ: Average Insight: Good Impulse Control: Good Appetite: Good Weight Loss: 0 Weight Gain: 0 Sleep: No Change Total Hours of Sleep: 8 Vegetative Symptoms: None  ADLScreening [facility] Assessment [facility]) Patient's cognitive ability adequate to safely complete daily activities?: Yes Patient able to express need for assistance with ADLs?: Yes Independently performs ADLs?: Yes (appropriate for developmental age)  Prior Inpatient Therapy Prior Inpatient Therapy: No  Prior Outpatient Therapy Prior Outpatient Therapy: No  ADL Screening (condition at time of admission) Patient's cognitive ability adequate to safely complete daily activities?: Yes Is the patient deaf or have difficulty hearing?: No Does the patient have difficulty seeing, even when wearing glasses/contacts?: No Does the patient have difficulty concentrating, remembering, or making decisions?: No Patient able to express need for assistance with ADLs?: Yes Does the patient have difficulty dressing or bathing?: No Independently performs ADLs?: Yes (appropriate for developmental age)  Home Assistive Devices/Equipment Home Assistive Devices/Equipment: None    Abuse/Neglect Assessment (Assessment to be complete while patient is alone) Physical Abuse: Denies Verbal Abuse: Denies Sexual Abuse: Denies Exploitation of patient/patient's resources: Denies Self-Neglect:  Denies Values / Beliefs Cultural Requests During Hospitalization: None Spiritual Requests During Hospitalization: None        Additional Information 1:1 In Past 12 Months?: No CIRT Risk: No Elopement Risk: No Does patient have medical clearance?: No     Disposition: Consulted with Alberteen SamFran Hobson, NP who agrees that pt does not meet inpatient criteria. Notified Dr. Clearance CootsHarper of recommendations.  Disposition Initial Assessment Completed for this Encounter: Yes Disposition of Patient: Outpatient treatment Type of outpatient treatment: Adult  On Site Evaluation by:   Reviewed with Physician:    Lahoma RockerLaquesta S Gay Moncivais 07/25/2013 2:12 AM

## 2013-07-25 NOTE — ED Notes (Signed)
Psych assessment via telepsych completed.

## 2013-07-25 NOTE — ED Notes (Signed)
Pt given off work note to cover his hospitalization earlier this week.  Will return to work on 5/3 night shift.

## 2013-07-25 NOTE — Discharge Instructions (Signed)
Depresión en el adulto   (Depression, Adult)  La depresión es un sentimiento de tristeza, decaimiento, sufrimiento espiritual o vacío. Hay dos tipos de depresión:   1. La depresión que todos experimentamos de tanto en tanto debido a experiencias de la vida inquietantes, como la pérdida del trabajo o el final de una relación (tristeza normal o duelo normal). Este tipo de depresión se considera normal, es de corta duración y se resuelve entre unos pocos días y 2 semanas. (La depresión que se experimenta tras la pérdida de un ser querido se llama duelo. El duelo en general dura más de 2 semanas, pero normalmente mejora con el tiempo).  2. La depresión clínica, es la que dura más que la tristeza o duelo normal o la que interfiere con su capacidad de desenvolverse en el hogar, en el trabajo y en la escuela. También interfiere en las relaciones personales. Afecta casi todos los aspectos de la vida. La depresión clínica es una enfermedad.  Los síntomas de depresión pueden tener su origen en otras afecciones que no sean la tristeza y el duelo o la depresión clínica. Ejemplos de estas afecciones son:   · Enfermedades físicas: Algunas enfermedades físicas, incluyendo poca actividad de la glándula tiroides (hipotiroidismo), anemia grave, ciertos tipos de cáncer, diabetes, convulsiones incontrolables, problemas cardíacos y pulmonares, ictus y el dolor crónico se asocian con síntomas de depresión.  · Efectos secundarios de algún medicamento recetado: En algunas personas, ciertos tipos de medicamentos recetados pueden causar síntomas de depresión.  · Abuso de sustancias: El abuso de alcohol y drogas puede causar síntomas de depresión.  SÍNTOMAS  Los síntomas de tristeza y duelo normal son:   · Sentirse triste o llorar durante períodos cortos de tiempo.  · Falta de preocupación por todo (apatía).  · Dificultad para dormir o dormir demasiado.  · No poder disfrutar de las cosas que antes disfrutaba.  · El deseo de estar solo todo el  tiempo (aislamiento social).  · Falta de energía o motivación.  · Dificultad para concentrarse o recordar.  · Cambios en el apetito o en el peso.  · Inquietud o agitación.  Los síntomas de la depresión clínica son los mismos de la tristeza o duelo normal y también incluyen los siguientes síntomas:   · Sentirse triste o llorar todo el tiempo.  · Sentimientos de culpa o inutilidad.  · Sentimientos de desesperanza o desamparo.  · Pensamientos de suicidio o el deseo de dañarse a sí mismo (ideas suicidas).  · Pérdida de contacto con la realidad (síntomas psicóticos). Ver o escuchar cosas que no son reales (alucinaciones)o tener creencias falsas acerca de su vida o de las personas que lo rodean (delirios y paranoia).  DIAGNÓSTICO   El diagnóstico de la depresión clínica se basa en la gravedad y duración de los síntomas. El médico le hará preguntas sobre su historia clínica y el uso de sustancias para determinar si una enfermedad física, el uso de medicamentos recetados, o el abuso de sustancias es la causa de su depresión. Su médico también puede indicar análisis de sangre.   TRATAMIENTO   Por lo general, la tristeza y el duelo normal no requieren tratamiento. Pero a veces se recetan antidepresivos durante el duelo para aliviar los síntomas de depresión hasta que se resuelven.   El tratamiento para la depresión clínica depende de la gravedad de los síntomas, pero suele incluir antidepresivos, psicoterapia con un profesional de la salud mental o una combinación de ambos. El médico le ayudará a determinar   qué tratamiento es el mejor para usted.   La depresión causada por una enfermedad física generalmente desaparece con tratamiento médico adecuado de la enfermedad. Si un medicamento recetado le causa depresión, hable con su médico acerca de suspenderlo, disminuir la dosis o sustituirlo por otro medicamento.   La depresión causada por el abuso de alcohol o abuso de drogas ilícitas se va con la abstinencia de estas  sustancias. Algunos adultos necesitan ayuda profesional con el fin de dejar de beber o usar drogas.   SOLICITE ATENCIÓN MÉDICA DE INMEDIATO SI:   · Tiene pensamientos acerca de lastimarse o dañar a otras personas.  · Pierde el contacto con la realidad (tiene síntomas psicóticos).  · Está tomando medicamentos para la depresión y tiene efectos colaterales graves.  PARA OBTENER MÁS INFORMACIÓN  National Alliance on Mental Illness: www.nami.org    National Institute of Mental Health: www.nimh.nih.gov    Document Released: 03/11/2005 Document Revised: 09/10/2011  ExitCare® Patient Information ©2014 ExitCare, LLC.

## 2013-07-25 NOTE — BH Assessment (Signed)
Assessment completed. Consulted with Alberteen Sam, NP who agrees that pt does not meet inpatient criteria. Pt will be provided with a list of community resources. Notified Dr. Clearance Coots of recommendations. Faxed a list of community resources.

## 2014-02-16 HISTORY — PX: CATARACT EXTRACTION W/ INTRAOCULAR LENS IMPLANT: SHX1309

## 2014-03-18 ENCOUNTER — Emergency Department (HOSPITAL_COMMUNITY)
Admission: EM | Admit: 2014-03-18 | Discharge: 2014-03-19 | Disposition: A | Discharge status: Home / Self Care | Payer: Self-pay | Attending: Emergency Medicine | Admitting: Emergency Medicine

## 2014-03-18 ENCOUNTER — Emergency Department (HOSPITAL_COMMUNITY): Payer: Self-pay

## 2014-03-18 ENCOUNTER — Encounter (HOSPITAL_COMMUNITY): Payer: Self-pay | Admitting: Oncology

## 2014-03-18 DIAGNOSIS — M791 Myalgia: Secondary | ICD-10-CM | POA: Insufficient documentation

## 2014-03-18 DIAGNOSIS — R079 Chest pain, unspecified: Secondary | ICD-10-CM | POA: Insufficient documentation

## 2014-03-18 DIAGNOSIS — I509 Heart failure, unspecified: Secondary | ICD-10-CM | POA: Insufficient documentation

## 2014-03-18 DIAGNOSIS — Z7952 Long term (current) use of systemic steroids: Secondary | ICD-10-CM | POA: Insufficient documentation

## 2014-03-18 DIAGNOSIS — Z7982 Long term (current) use of aspirin: Secondary | ICD-10-CM | POA: Insufficient documentation

## 2014-03-18 DIAGNOSIS — R109 Unspecified abdominal pain: Secondary | ICD-10-CM

## 2014-03-18 DIAGNOSIS — I10 Essential (primary) hypertension: Secondary | ICD-10-CM | POA: Insufficient documentation

## 2014-03-18 DIAGNOSIS — R51 Headache: Secondary | ICD-10-CM | POA: Insufficient documentation

## 2014-03-18 DIAGNOSIS — Z9889 Other specified postprocedural states: Secondary | ICD-10-CM | POA: Insufficient documentation

## 2014-03-18 DIAGNOSIS — G8929 Other chronic pain: Secondary | ICD-10-CM | POA: Insufficient documentation

## 2014-03-18 DIAGNOSIS — R1084 Generalized abdominal pain: Secondary | ICD-10-CM | POA: Insufficient documentation

## 2014-03-18 DIAGNOSIS — Z79899 Other long term (current) drug therapy: Secondary | ICD-10-CM | POA: Insufficient documentation

## 2014-03-18 DIAGNOSIS — R197 Diarrhea, unspecified: Secondary | ICD-10-CM | POA: Insufficient documentation

## 2014-03-18 LAB — CBC
HCT: 46 % (ref 39.0–52.0)
Hemoglobin: 15.2 g/dL (ref 13.0–17.0)
MCH: 28.4 pg (ref 26.0–34.0)
MCHC: 33 g/dL (ref 30.0–36.0)
MCV: 86 fL (ref 78.0–100.0)
Platelets: 281 K/uL (ref 150–400)
RBC: 5.35 MIL/uL (ref 4.22–5.81)
RDW: 14.3 % (ref 11.5–15.5)
WBC: 16.6 K/uL — ABNORMAL HIGH (ref 4.0–10.5)

## 2014-03-18 LAB — I-STAT TROPONIN, ED: Troponin i, poc: 0 ng/mL (ref 0.00–0.08)

## 2014-03-18 MED ORDER — ONDANSETRON HCL 4 MG/2ML IJ SOLN
4.0000 mg | Freq: Once | INTRAMUSCULAR | Status: AC
  Administered 2014-03-19: 4 mg via INTRAVENOUS
  Filled 2014-03-18: qty 2

## 2014-03-18 MED ORDER — ASPIRIN 325 MG PO TABS
325.0000 mg | ORAL_TABLET | Freq: Once | ORAL | Status: AC
  Administered 2014-03-19: 325 mg via ORAL
  Filled 2014-03-18: qty 1

## 2014-03-18 MED ORDER — SODIUM CHLORIDE 0.9 % IV BOLUS (SEPSIS)
1000.0000 mL | Freq: Once | INTRAVENOUS | Status: AC
  Administered 2014-03-19: 1000 mL via INTRAVENOUS

## 2014-03-18 MED ORDER — HYDROMORPHONE HCL 1 MG/ML IJ SOLN
0.5000 mg | Freq: Once | INTRAMUSCULAR | Status: AC
  Administered 2014-03-19: 0.5 mg via INTRAVENOUS
  Filled 2014-03-18: qty 1

## 2014-03-18 NOTE — ED Notes (Signed)
Pt reports multiple episodes of diarrhea yesterday.  Pt presents d/t left chest pain.  Pt cannot tolerate PO at this time.  Pt rates pain in abdomen 7 sharp in nature.

## 2014-03-18 NOTE — ED Notes (Signed)
Patient currently in CXR

## 2014-03-19 ENCOUNTER — Emergency Department (HOSPITAL_COMMUNITY): Payer: Self-pay

## 2014-03-19 LAB — URINE MICROSCOPIC-ADD ON

## 2014-03-19 LAB — COMPREHENSIVE METABOLIC PANEL
ALT: 27 U/L (ref 0–53)
AST: 24 U/L (ref 0–37)
Albumin: 4.3 g/dL (ref 3.5–5.2)
Alkaline Phosphatase: 72 U/L (ref 39–117)
Anion gap: 7 (ref 5–15)
BUN: 18 mg/dL (ref 6–23)
CALCIUM: 9.3 mg/dL (ref 8.4–10.5)
CO2: 26 mmol/L (ref 19–32)
Chloride: 103 mEq/L (ref 96–112)
Creatinine, Ser: 0.97 mg/dL (ref 0.50–1.35)
GFR calc Af Amer: >90 mL/min (ref >90)
GLUCOSE: 110 mg/dL — AB (ref 70–99)
Potassium: 4 mmol/L (ref 3.5–5.1)
Sodium: 136 mmol/L (ref 135–145)
TOTAL PROTEIN: 8.3 g/dL (ref 6.0–8.3)
Total Bilirubin: 0.8 mg/dL (ref 0.3–1.2)

## 2014-03-19 LAB — URINALYSIS, ROUTINE W REFLEX MICROSCOPIC
Bilirubin Urine: NEGATIVE
Glucose, UA: NEGATIVE mg/dL
Ketones, ur: NEGATIVE mg/dL
LEUKOCYTES UA: NEGATIVE
NITRITE: NEGATIVE
Protein, ur: 30 mg/dL — AB
Specific Gravity, Urine: 1.016 (ref 1.005–1.030)
Urobilinogen, UA: 0.2 mg/dL (ref 0.0–1.0)
pH: 5.5 (ref 5.0–8.0)

## 2014-03-19 LAB — I-STAT CG4 LACTIC ACID, ED: Lactic Acid, Venous: 1.83 mmol/L (ref 0.5–2.2)

## 2014-03-19 LAB — MAGNESIUM: MAGNESIUM: 1.6 mg/dL (ref 1.5–2.5)

## 2014-03-19 MED ORDER — IOHEXOL 300 MG/ML  SOLN
50.0000 mL | Freq: Once | INTRAMUSCULAR | Status: AC | PRN
  Administered 2014-03-19: 50 mL via ORAL

## 2014-03-19 MED ORDER — OXYCODONE-ACETAMINOPHEN 5-325 MG PO TABS
1.0000 | ORAL_TABLET | Freq: Once | ORAL | Status: AC
  Administered 2014-03-19: 1 via ORAL
  Filled 2014-03-19: qty 1

## 2014-03-19 MED ORDER — IOHEXOL 300 MG/ML  SOLN
100.0000 mL | Freq: Once | INTRAMUSCULAR | Status: AC | PRN
  Administered 2014-03-19: 100 mL via INTRAVENOUS

## 2014-03-19 MED ORDER — OXYCODONE-ACETAMINOPHEN 5-325 MG PO TABS: 1.0000 | ORAL_TABLET | Freq: Four times a day (QID) | ORAL | Status: DC | PRN

## 2014-03-19 NOTE — Discharge Instructions (Signed)
Dolor abdominal (Abdominal Pain) El dolor de estmago (abdominal) puede tener muchas causas. La mayora de las veces, el dolor de [place] no es peligroso. Muchos de Franklin Resourcesestos casos de dolor de estmago pueden controlarse y tratarse en casa. CUIDADOS EN EL HOGAR   No tome medicamentos que lo ayuden a defecar (laxantes), salvo que su mdico se lo indique.  Solo tome los medicamentos que le haya indicado su mdico.  Coma o beba lo que le indique su mdico. Su mdico le dir si debe seguir una dieta especial. SOLICITE AYUDA SI:  No sabe cul es la causa del dolor de [place].  Tiene dolor de estmago cuando siente ganas de vomitar (nuseas) o tiene colitis (diarrea).  Tiene dolor durante la miccin o la evacuacin.  El dolor de estmago lo despierta de noche.  Tiene dolor de Mirantestmago que empeora o [place]mejora cuando come.  Tiene dolor de Mirantestmago que empeora cuando come CIGNAalimentos grasosos.  Tiene fiebre. SOLICITE AYUDA DE INMEDIATO SI:   El dolor no desaparece en un plazo mximo de 2horas.  No deja de (vomitar).  El dolor cambia y se Librarian, academiclocaliza solo en la parte derecha o izquierda del [place].  La materia fecal es sanguinolenta o de aspecto alquitranado. ASEGRESE DE QUE:   Comprende estas instrucciones.  Controlar su afeccin.  Recibir ayuda de inmediato si no mejora o si empeora. Document Released: 06/07/2008 Document Revised: 03/16/2013 [facility] Patient Information 2015 [place] GrandeExitCare, MarylandLLC. This information is not intended to replace advice given to you by your health care provider. Make sure you discuss any questions you have with your health care provider.   Emergency Department Resource Guide 1) Find a Doctor and Pay Out of Pocket Although you won't have to find out who is covered by your insurance plan, it is a good idea to ask around and get recommendations. You will then need to call the office and see if the doctor you have chosen will accept you as a new patient and what  types of options they offer for patients who are self-pay. Some doctors offer discounts or will set up payment plans for their patients who do not have insurance, but you will need to ask so you aren't surprised when you get to your appointment.  2) Contact Your Local Health Department Not all health departments have doctors that can see patients for sick visits, but many do, so it is worth a call to see if yours does. If you don't know where your local health department is, you can check in your phone book. The CDC also has a tool to help you locate your state's health department, and many state websites also have listings of all of their local health departments.  3) Find a Walk-in Clinic If your illness is not likely to be very severe or complicated, you may want to try a walk in clinic. These are popping up all over the country in pharmacies, drugstores, and shopping centers. They're usually staffed by nurse practitioners or physician assistants that have been trained to treat common illnesses and complaints. They're usually fairly quick and inexpensive. However, if you have serious medical issues or chronic medical problems, these are probably not your best option.  No Primary Care Doctor: - Call Health Connect at  [phone] - they can help you locate a primary care doctor that  accepts your insurance, provides certain services, etc. - Physician Referral Service- [phone]  Chronic Pain Problems: Organization         Address  Phone   Notes  [facility]  [phone] Patients need to be referred by their primary care doctor.   Medication Assistance: Organization         Address  Phone   Notes  [facility] Medication [facility] [address] [place] [place], [place] [address] [phone] --Must be a resident of [facility] -- Must have NO insurance coverage whatsoever (no Medicaid/ Medicare, etc.) -- The pt. MUST have a primary care doctor that  directs their care regularly and follows them in the community   [facility]  [phone]   [employer]  [phone]    Agencies that provide inexpensive medical care: Organization         Address  Phone   Notes  [facility]  [phone]   [facility]    [phone]   [facility] [address] [place], [place] [address] [phone]   [facility] [address] [place] [place] [phone]   Planned Parenthood    [phone]   [facility]    [phone]   [facility] and [facility]  [address], [place] Phone:  [phone], Fax:  [phone] Hours of Operation:  9 am - 6 pm, M-F.  Also accepts Medicaid/Medicare and self-pay.  [facility]  [address], [place] Phone: [phone], Fax: [phone]. Hours of Operation:  8:30 am - 5:30 pm, M-F.  Also accepts Medicaid and self-pay.  [facility] [place] [address], [place] Phone: [phone]   [facility] [address] Natasha Bence [place], [place] [phone], Ext. [phone] Mondays & Thursdays: 7-9 AM.  First 15 patients are seen on a first come, first serve basis.    Medicaid-accepting [facility] Providers:  Organization         Address  Phone   Notes  [facility] [address], [place] [phone] Also accepts self-pay patients.  [facility] [address] Laurell Josephs [place], [place]  [phone]   [facility] [address], [place] [phone]   [facility] [address], [place] [phone]   Renaye Rakers [address], [place]   [phone] Only accepts [place] Access [place] patients after they have their name applied to their card.   Self-Pay (no insurance) in [facility]:  Organization          Address  Phone   Notes  Sickle Cell Patients, [facility] [address] [place], [place] [phone]   [facility] [address] [place], [place] [phone]   [facility] [place]  [address], [place] [phone]   [facility]/Dr. Osei-Bonsu  [address] [place] or [address], [place] [phone] Phone number for both [place] and [place] locations is the same.  [facility] [address], [place] [phone]   [facility] [address], [place] or [address] [phone]   [facility] [address] [place] [phone], phone; [phone], fax Sees patients 1st and 3rd Saturday of every month.  Must not qualify for public  or private insurance (i.e. Medicaid, Medicare, [place] Health Choice, Veterans' Benefits)  Household income should be no more than 200% of the poverty level The clinic cannot treat you if you are pregnant or think you are pregnant  Sexually transmitted diseases are not treated at the clinic.    Dental Care: Organization         Address  Phone  Notes  [facility] of [facility] [place] [phone] Accepts children up to age 52 who are enrolled in [place] or [place]; pregnant women with a Medicaid card; and children who have applied for Medicaid or [place] Health Choice, but were declined, whose parents can pay a reduced fee at time of service.  [facility] Department of [facility]  [address], [place] [phone] Accepts children up to age 37 who are enrolled in [place] or [place]; pregnant women with a Medicaid card; and children who have applied for Medicaid or [place] Health Choice, but were declined, whose parents can pay a reduced fee at time of  service.  [facility]  [place] [phone] Patients are seen by appointment only. Walk-ins are not accepted. [place] will see patients 54 years of age and older. Monday - Tuesday (8am-5pm) Most Wednesdays (8:30-5pm) $30 per visit, cash only  [facility] Adult [facility] Access PRO[facility]  [address], [facility] [phone] Patients are seen by appointment only. Walk-ins are not accepted. [place] will see patients 54 years of age and older. One Wednesday Evening (Monthly: Volunteer Based).  $30 per visit, cash only  [place]  [phone] for adults; Children under age 25, call Graduate Pediatric Dentistry at [phone]. Children aged 51-14, please call [phone] to request a pediatric application.  Dental services are provided in all areas of dental care including fillings, crowns and bridges, complete and partial dentures, implants, gum treatment, root canals, and extractions. Preventive care is also provided. Treatment is provided to both adults and children. Patients are selected via a lottery and there is often a waiting list.   [facility] [address], [place]  [phone] [email or web address]   [facility] [address] [place], [place] [phone], Ext. [phone] Second and Fourth Thursday of each month, opens at 6:30 AM; Clinic ends at 9 AM.  Patients are seen on a first-come first-served basis, and a limited number are seen during each clinic.   [facility]  [address] Hillard Danker [place], [place] [phone]   Eligibility Requirements You must have lived in [place], [place], or [place] counties for at least the last three months.   You cannot be eligible for state or federal sponsored [employer], including [employer], [place], or [employer].   You generally cannot be eligible for healthcare insurance through your employer.     How to apply: Eligibility screenings are held every Tuesday and Wednesday afternoon from 1:00 pm until 4:00 pm. You do not need an appointment for the interview!  [facility] [address] [place], [place]   [place]  [place] Department  [place]  [phone]    Behavioral Health Resources in the Community: Intensive Outpatient Programs Organization         Address  Phone  Notes  [facility] [address], [place], [place] [phone]   °[facility] [address], [place], [place] [phone]   °[facility] [address], [place], [place] ° [phone]   °[facility] [address],  °[place], [place] 1-800-853-5163 or 336-641-4981   °Substance Abuse Resources °Organization         Address  Phone  Notes  °[facility]  336-882-2125   °[facility]  [phone]   °[facility]  336-285-9073   °[facility]  336-845-3988   °Residential & Outpatient Substance Abuse Program  1-800-659-3381   °Psychological Services °Organization         Address  Phone  Notes  °[facility]  336- [phone]   °[facility]  336- 378-7881   °[facility] [address], [place] 1-800-853-5163 or 336-641-4981   ° °Mobile Crisis Teams °Organization         Address  Phone  Notes  °[facility], Mobile Crisis Care Unit  1-877-626-1772   °Assertive °Psychotherapeutic Services ° [address] [place], [place] 336-834-9664   °Sharon DeEsch [address] °[place] [phone]   ° °Self-Help/Support Groups °Organization         Address  Phone             Notes  °[facility] - variety of support groups  336- 373-1402 Call for more information  °Narcotics Anonymous (NA), [facility] [address], °[place]  2 meetings at this location  ° °Residential Treatment Programs °Organization         Address  Phone  Notes  °[facility] [address],    °[place]  1-866-801-8205   °[facility] ° [address], [place], [place] [phone]   °[facility] [address], [place] 336-845-3988 Admissions: 8am-3pm M-F  °[facility] [address]    °[place], [place] 336-841-1104   °[facility] [address], [place], [place] [phone]   °[facility] [address]  °[place], [place] 336-285-9073   °[facility] - Intensive Outpatient [address], [place], [place] 336-852-3033   °[facility] [address]  °[place], [place] 1-877-615-2722 or 336-784-9470   °[facility] ([facility]) [address] [place], [place] 336-227-7417 Accepts Medicaid  °[facility] [address]  °[place] 1-800-659-3381 Substance Abuse/Addiction Treatment  ° °[place] [facility] Resources °Organization         Address  Phone  Notes  °[facility]  (888) 581-9988   °Julie Brannon, PhD [address] [place], [place]   (336) 349-5553 or (336) 951-0000   ° Behavioral   [address] °[place], [place] (336) 349-4454   °[facility] [address], [place], [place] (336) 342-8316 Insurance/Medicaid/sponsorship through [facility]  °[facility] [address]                                    [place], [place] (336) 342-8316 Therapy/tele-psych/case  °[facility] [address]  ° [place], [place] (336) 349-2233    °Dr. Arfeen  (336) 349-4544   °[facility] [place]  [employer] [place] [facility]   Dept. 1) [address], [place] °2) [address], [place] °3)  [address], [place] (336) 349-3220 °(336) 342-7768 ° °(336) 342-8140   °[place] [facility]  Hotline (336) 342-1394 or (336) 342-3537 (After Hours)    ° ° ° °

## 2014-03-19 NOTE — ED Notes (Signed)
Patient transported to CT 

## 2014-03-19 NOTE — ED Notes (Signed)
Pt was given urinal to use.  Will check in 30 minutes for result.

## 2014-03-19 NOTE — ED Provider Notes (Signed)
CSN: [id number]     Arrival date & time 03/18/14  2306 History   First MD Initiated Contact with Patient 03/18/14 2328     Chief Complaint  Patient presents with  . Chest Pain   HPI  Patient is a 48 year old male Spanish speaker who presents emergency room for evaluation of chest pain left-sided and generalized abdominal pain. The patient's son translated for him. Patient started with generalized abdominal pain yesterday with the right side being worse than the left side and diarrhea all day. Patient states that he tried drinking some lemonade to help with has abdominal pain with little relief. This morning he developed worsening abdominal pain with resolution of the diarrhea. He is also having left sided chest pain that feels like pressure. Patient has had chest pain in the past. He has been admitted for unstable angina. Patient does have a history of hypertension and CHF. Patient's son states that the patient has been out of his hypertension medications for the past 4 days. He called his doctor but hasn't been able to get a refill. Patient is also out of his eye medication. He had a corneal transplant and cataract extraction and is followed by an ophthalmologist in [facility]. Patient denies any recent surgeries or surgeries on his belly. He denies any blood in his bowel movements or dark tarry stools. He has never had pain like this before. He denies urinary symptoms. Patient denies any history of blood clots. There is negative family history for sudden cardiac deaths.  Past Medical History  Diagnosis Date  . CHF (congestive heart failure)   . Hypertension   . Chronic back pain     "neck down" (07/20/2013)   Past Surgical History  Procedure Laterality Date  . Eye surgery Right   . Corneal transplant Right ~ 2010  . Cataract extraction w/ intraocular lens implant Right 02/16/2014  . Cardiac catheterization  2014?   History reviewed. No pertinent family history. History  Substance Use  Topics  . Smoking status: Never Smoker   . Smokeless tobacco: Never Used  . Alcohol Use: No    Review of Systems  Constitutional: Negative for fever, chills and fatigue.  Respiratory: Negative for cough, chest tightness, shortness of breath and wheezing.   Cardiovascular: Positive for chest pain. Negative for palpitations and leg swelling.  Gastrointestinal: Positive for abdominal pain and diarrhea. Negative for nausea, vomiting, constipation, blood in stool, abdominal distention and anal bleeding.  Genitourinary: Negative for dysuria, urgency, frequency, hematuria and difficulty urinating.  Musculoskeletal: Positive for myalgias.  Neurological: Positive for headaches. Negative for dizziness, syncope and numbness.  All other systems reviewed and are negative.     Allergies  Review of patient's allergies indicates no known allergies.  Home Medications   Prior to Admission medications   Medication Sig Start Date End Date Taking? Authorizing Provider  atenolol (TENORMIN) 50 MG tablet Take 50 mg by mouth daily.   Yes Historical Provider, MD  benazepril-hydrochlorthiazide (LOTENSIN HCT) 20-12.5 MG per tablet Take 1 tablet by mouth daily.   Yes Historical Provider, MD  carvedilol (COREG) 25 MG tablet Take 25 mg by mouth 2 (two) times daily with a meal.   Yes Historical Provider, MD  enalapril (VASOTEC) 20 MG tablet Take 40 mg by mouth daily.   Yes Historical Provider, MD  aspirin EC 81 MG tablet Take 1 tablet (81 mg total) by mouth daily. Patient not taking: Reported on 03/19/2014 07/20/13   Ripudeep Jenna Luo, MD  enalapril (VASOTEC)  20 MG tablet Take 20 mg by mouth daily.    Historical Provider, MD  furosemide (LASIX) 20 MG tablet Take 1 tablet (20 mg total) by mouth daily. Patient not taking: Reported on 03/19/2014 07/21/13   Ripudeep Jenna LuoK Rai, MD  hydrochlorothiazide (HYDRODIURIL) 25 MG tablet Take 25 mg by mouth daily.    Historical Provider, MD  oxyCODONE-acetaminophen (PERCOCET) 5-325 MG  per tablet Take 1 tablet by mouth every 6 (six) hours as needed. 03/19/14   Sahory Nordling A Forcucci, PA-C  prednisoLONE acetate (PRED FORTE) 1 % ophthalmic suspension Place 1 drop into the right eye 3 (three) times daily.    Historical Provider, MD  ranitidine (ZANTAC) 150 MG tablet Take 150 mg by mouth at bedtime.    Historical Provider, MD   BP 153/76 mmHg  Pulse 87  Temp(Src) 99.6 F (37.6 C) (Oral)  Resp 15  SpO2 96% Physical Exam  Constitutional: He is oriented to person, place, and time. He appears well-developed and well-nourished. No distress.  HENT:  Head: Normocephalic and atraumatic.  Mouth/Throat: Oropharynx is clear and moist. No oropharyngeal exudate.  Eyes: Conjunctivae and EOM are normal. Pupils are equal, round, and reactive to light. No scleral icterus.  Neck: Normal range of motion. Neck supple. No JVD present. No thyromegaly present.  Cardiovascular: Normal rate, regular rhythm, normal heart sounds and intact distal pulses.  Exam reveals no gallop and no friction rub.   No murmur heard. Pulmonary/Chest: Effort normal and breath sounds normal. No respiratory distress. He has no wheezes. He has no rales. He exhibits no tenderness.  Abdominal: He exhibits no distension and no mass. There is tenderness. There is rebound and guarding.  Musculoskeletal: Normal range of motion.  Lymphadenopathy:    He has no cervical adenopathy.  Neurological: He is alert and oriented to person, place, and time. He has normal strength. No cranial nerve deficit or sensory deficit.  Skin: Skin is warm and dry. He is not diaphoretic.  Psychiatric: He has a normal mood and affect. His behavior is normal. Judgment and thought content normal.  Nursing note and vitals reviewed.   ED Course  Procedures (including critical care time) Labs Review Labs Reviewed  COMPREHENSIVE METABOLIC PANEL - Abnormal; Notable for the following:    Glucose, Bld 110 (*)    All other components within normal limits   CBC - Abnormal; Notable for the following:    WBC 16.6 (*)    All other components within normal limits  URINALYSIS, ROUTINE W REFLEX MICROSCOPIC - Abnormal; Notable for the following:    Hgb urine dipstick TRACE (*)    Protein, ur 30 (*)    All other components within normal limits  MAGNESIUM  URINE MICROSCOPIC-ADD ON  LIPASE, BLOOD  I-STAT TROPOININ, ED  I-STAT CG4 LACTIC ACID, ED    Imaging Review Dg Chest 2 View  03/18/2014   CLINICAL DATA:  Chest pain, left-sided chest pain,  short of breath  EXAM: CHEST  2 VIEW  COMPARISON:  Radiograph 07/24/2013  FINDINGS: Normal mediastinum and cardiac silhouette. Normal pulmonary vasculature. No evidence of effusion, infiltrate, or pneumothorax. No acute bony abnormality.  IMPRESSION: No acute cardiopulmonary process.   Electronically Signed   By: Genevive BiStewart  Edmunds M.D.   On: 03/18/2014 23:38   Ct Abdomen Pelvis W Contrast  03/19/2014   CLINICAL DATA:  LEFT chest and abdominal pain with diarrhea for 1 day.  EXAM: CT ABDOMEN AND PELVIS WITH CONTRAST  TECHNIQUE: Multidetector CT imaging of the abdomen and  pelvis was performed using the standard protocol following bolus administration of intravenous contrast.  CONTRAST:  50mL OMNIPAQUE IOHEXOL 300 MG/ML SOLN, OMNIPAQUE IOHEXOL 300 MG/ML SOLN  COMPARISON:  None.  FINDINGS: LUNG BASES: Included view of the lung bases are clear. Visualized heart and pericardium are unremarkable.  SOLID ORGANS: The liver, spleen, gallbladder, pancreas and adrenal glands are unremarkable.  GASTROINTESTINAL TRACT: The stomach, small and large bowel are normal in course and caliber without inflammatory changes. Normal appendix.  KIDNEYS/ URINARY TRACT: Kidneys are orthotopic, demonstrating symmetric enhancement. No nephrolithiasis, hydronephrosis or solid renal masses. 2.9 cm RIGHT upper pole, 16 mm RIGHT interpolar, 18 mm LEFT lower pole cyst. Too small to characterize hypodensity LEFT kidney the The unopacified ureters  are normal in course and caliber. Delayed imaging through the kidneys demonstrates symmetric prompt contrast excretion within the proximal urinary collecting system. Urinary bladder is partially distended and unremarkable.  PERITONEUM/RETROPERITONEUM: No intraperitoneal free fluid nor free air. Aortoiliac vessels are normal in course and caliber. No lymphadenopathy by CT size criteria. Prostate is 4.7 x 3.9 cm.  SOFT TISSUE/OSSEOUS STRUCTURES: Nonsuspicious. Lomotil arthropathy. Small fat umbilical hernia.  IMPRESSION: No acute intra-abdominal or pelvic process.   Electronically Signed   By: Awilda Metro   On: 03/19/2014 02:12     EKG Interpretation   Date/Time:  Friday March 18 2014 23:11:21 EST Ventricular Rate:  114 PR Interval:  153 QRS Duration: 89 QT Interval:  369 QTC Calculation: 508 R Axis:   42 Text Interpretation:  Sinus tachycardia with irregular rate Borderline T  wave abnormalities Prolonged QT interval Confirmed by Erroll Luna [phone]) on 03/19/2014 12:05:50 AM      MDM   Final diagnoses:  Abdominal pain  Chest pain, unspecified chest pain type  Diarrhea   Patient is a 48 year old male who presents emergency room for evaluation of abdominal pain and chest pain. Physical exam reveals significant tenderness generalized leg of the abdomen. There is some guarding. There is no rigidity. EKG is sinus tachycardia. Rate is negative. I-STAT troponin is negative. CBC reveals leukocytosis with no other abnormalities. I-STAT lactic acid is negative. CMP and magnesium are normal. Lipase is negative. CT scan of the abdomen was performed given significant tenderness and mild leukocytosis. CT of the abdomen reveals no acute abnormalities at this time. On recheck the patient he is no longer having chest pain. Patient is considerably improved abdominal pain. Do not feel that this is infectious diarrhea at this time. We'll discharge the patient home with pain medication. I  have urged him to follow-up with his primary care provider. He is to return for intractable nausea and vomiting, intractable pain, or any other concerning symptoms. He states understanding and agreement at this time. Patient is stable for discharge. I discussed the patient with Dr. Mora Bellman who agrees with the above discharge and plan.      Eben Burow, PA-C 03/19/14 6045  Tomasita Crumble, MD 03/19/14 1345

## 2014-03-19 NOTE — ED Notes (Signed)
Patient back from CT.

## 2014-03-19 NOTE — ED Notes (Signed)

## 2014-05-02 ENCOUNTER — Encounter (HOSPITAL_COMMUNITY): Payer: Self-pay

## 2014-05-02 ENCOUNTER — Emergency Department (HOSPITAL_COMMUNITY): Payer: Medicaid Other

## 2014-05-02 ENCOUNTER — Inpatient Hospital Stay (HOSPITAL_COMMUNITY)
Admission: EM | Admit: 2014-05-02 | Discharge: 2014-05-04 | DRG: 292 | Disposition: A | Discharge status: Home / Self Care | Payer: Medicaid Other | Attending: Internal Medicine | Admitting: Internal Medicine

## 2014-05-02 ENCOUNTER — Emergency Department (INDEPENDENT_AMBULATORY_CARE_PROVIDER_SITE_OTHER)
Admission: EM | Admit: 2014-05-02 | Discharge: 2014-05-02 | Disposition: A | Discharge status: Other Acute Inpatient Hospital | Payer: Self-pay | Source: Home / Self Care | Attending: Family Medicine | Admitting: Family Medicine

## 2014-05-02 DIAGNOSIS — H578 Other specified disorders of eye and adnexa: Secondary | ICD-10-CM

## 2014-05-02 DIAGNOSIS — R0789 Other chest pain: Secondary | ICD-10-CM

## 2014-05-02 DIAGNOSIS — E039 Hypothyroidism, unspecified: Secondary | ICD-10-CM | POA: Diagnosis present

## 2014-05-02 DIAGNOSIS — Z9841 Cataract extraction status, right eye: Secondary | ICD-10-CM | POA: Diagnosis not present

## 2014-05-02 DIAGNOSIS — R946 Abnormal results of thyroid function studies: Secondary | ICD-10-CM | POA: Diagnosis present

## 2014-05-02 DIAGNOSIS — Z961 Presence of intraocular lens: Secondary | ICD-10-CM | POA: Diagnosis present

## 2014-05-02 DIAGNOSIS — R748 Abnormal levels of other serum enzymes: Secondary | ICD-10-CM | POA: Diagnosis present

## 2014-05-02 DIAGNOSIS — R7301 Impaired fasting glucose: Secondary | ICD-10-CM | POA: Diagnosis present

## 2014-05-02 DIAGNOSIS — I209 Angina pectoris, unspecified: Secondary | ICD-10-CM | POA: Diagnosis present

## 2014-05-02 DIAGNOSIS — I509 Heart failure, unspecified: Secondary | ICD-10-CM

## 2014-05-02 DIAGNOSIS — Z23 Encounter for immunization: Secondary | ICD-10-CM | POA: Diagnosis not present

## 2014-05-02 DIAGNOSIS — Z7982 Long term (current) use of aspirin: Secondary | ICD-10-CM

## 2014-05-02 DIAGNOSIS — I5020 Unspecified systolic (congestive) heart failure: Principal | ICD-10-CM | POA: Diagnosis present

## 2014-05-02 DIAGNOSIS — Z79899 Other long term (current) drug therapy: Secondary | ICD-10-CM

## 2014-05-02 DIAGNOSIS — Z947 Corneal transplant status: Secondary | ICD-10-CM

## 2014-05-02 DIAGNOSIS — H53141 Visual discomfort, right eye: Secondary | ICD-10-CM

## 2014-05-02 DIAGNOSIS — I420 Dilated cardiomyopathy: Secondary | ICD-10-CM | POA: Diagnosis present

## 2014-05-02 DIAGNOSIS — H5789 Other specified disorders of eye and adnexa: Secondary | ICD-10-CM

## 2014-05-02 DIAGNOSIS — R0609 Other forms of dyspnea: Secondary | ICD-10-CM

## 2014-05-02 DIAGNOSIS — K219 Gastro-esophageal reflux disease without esophagitis: Secondary | ICD-10-CM | POA: Diagnosis present

## 2014-05-02 DIAGNOSIS — I10 Essential (primary) hypertension: Secondary | ICD-10-CM | POA: Diagnosis present

## 2014-05-02 DIAGNOSIS — E785 Hyperlipidemia, unspecified: Secondary | ICD-10-CM | POA: Diagnosis present

## 2014-05-02 DIAGNOSIS — R079 Chest pain, unspecified: Secondary | ICD-10-CM | POA: Diagnosis present

## 2014-05-02 DIAGNOSIS — H547 Unspecified visual loss: Secondary | ICD-10-CM

## 2014-05-02 DIAGNOSIS — I429 Cardiomyopathy, unspecified: Secondary | ICD-10-CM

## 2014-05-02 DIAGNOSIS — H5711 Ocular pain, right eye: Secondary | ICD-10-CM | POA: Diagnosis present

## 2014-05-02 DIAGNOSIS — R9431 Abnormal electrocardiogram [ECG] [EKG]: Secondary | ICD-10-CM

## 2014-05-02 DIAGNOSIS — H5319 Other subjective visual disturbances: Secondary | ICD-10-CM

## 2014-05-02 HISTORY — DX: Gastro-esophageal reflux disease without esophagitis: K21.9

## 2014-05-02 LAB — COMPREHENSIVE METABOLIC PANEL
ALBUMIN: 3.6 g/dL (ref 3.5–5.2)
ALT: 29 U/L (ref 0–53)
AST: 25 U/L (ref 0–37)
Alkaline Phosphatase: 69 U/L (ref 39–117)
Anion gap: 9 (ref 5–15)
BUN: 17 mg/dL (ref 6–23)
CALCIUM: 9.1 mg/dL (ref 8.4–10.5)
CHLORIDE: 101 mmol/L (ref 96–112)
CO2: 29 mmol/L (ref 19–32)
CREATININE: 0.93 mg/dL (ref 0.50–1.35)
Glucose, Bld: 106 mg/dL — ABNORMAL HIGH (ref 70–99)
Potassium: 3.5 mmol/L (ref 3.5–5.1)
Sodium: 139 mmol/L (ref 135–145)
TOTAL PROTEIN: 7.8 g/dL (ref 6.0–8.3)
Total Bilirubin: 0.3 mg/dL (ref 0.3–1.2)

## 2014-05-02 LAB — TROPONIN I: Troponin I: 0.03 ng/mL (ref <0.031)

## 2014-05-02 LAB — BASIC METABOLIC PANEL
ANION GAP: 5 (ref 5–15)
BUN: 18 mg/dL (ref 6–23)
CHLORIDE: 106 mmol/L (ref 96–112)
CO2: 29 mmol/L (ref 19–32)
CREATININE: 0.85 mg/dL (ref 0.50–1.35)
Calcium: 8.8 mg/dL (ref 8.4–10.5)
GFR calc non Af Amer: >90 mL/min (ref >90)
Glucose, Bld: 103 mg/dL — ABNORMAL HIGH (ref 70–99)
POTASSIUM: 3.7 mmol/L (ref 3.5–5.1)
Sodium: 140 mmol/L (ref 135–145)

## 2014-05-02 LAB — CBC
HCT: 42.3 % (ref 39.0–52.0)
Hemoglobin: 13.9 g/dL (ref 13.0–17.0)
MCH: 28.3 pg (ref 26.0–34.0)
MCHC: 32.9 g/dL (ref 30.0–36.0)
MCV: 86 fL (ref 78.0–100.0)
PLATELETS: 295 10*3/uL (ref 150–400)
RBC: 4.92 MIL/uL (ref 4.22–5.81)
RDW: 14.8 % (ref 11.5–15.5)
WBC: 8.2 10*3/uL (ref 4.0–10.5)

## 2014-05-02 LAB — ETHANOL

## 2014-05-02 LAB — I-STAT TROPONIN, ED
Troponin i, poc: 0.02 ng/mL (ref 0.00–0.08)
Troponin i, poc: 0.01 ng/mL (ref 0.00–0.08)

## 2014-05-02 LAB — LIPID PANEL
CHOLESTEROL: 213 mg/dL — AB (ref 0–200)
HDL: 34 mg/dL — ABNORMAL LOW (ref >39)
LDL Cholesterol: 140 mg/dL — ABNORMAL HIGH (ref 0–99)
Total CHOL/HDL Ratio: 6.3 RATIO
Triglycerides: 194 mg/dL — ABNORMAL HIGH (ref <150)
VLDL: 39 mg/dL (ref 0–40)

## 2014-05-02 LAB — PROTIME-INR
INR: 0.99 (ref 0.00–1.49)
Prothrombin Time: 13.2 seconds (ref 11.6–15.2)

## 2014-05-02 LAB — BRAIN NATRIURETIC PEPTIDE: B Natriuretic Peptide: 104.3 pg/mL — ABNORMAL HIGH (ref 0.0–100.0)

## 2014-05-02 LAB — MAGNESIUM: Magnesium: 2.1 mg/dL (ref 1.5–2.5)

## 2014-05-02 LAB — PHOSPHORUS: PHOSPHORUS: 3.9 mg/dL (ref 2.3–4.6)

## 2014-05-02 MED ORDER — MORPHINE SULFATE 2 MG/ML IJ SOLN: 1.0000 mg | INTRAMUSCULAR | Status: DC | PRN

## 2014-05-02 MED ORDER — ACETAMINOPHEN 650 MG RE SUPP: 650.0000 mg | Freq: Four times a day (QID) | RECTAL | Status: DC | PRN

## 2014-05-02 MED ORDER — ASPIRIN 81 MG PO CHEW
CHEWABLE_TABLET | ORAL | Status: AC
  Filled 2014-05-02: qty 4

## 2014-05-02 MED ORDER — ACETAMINOPHEN 325 MG PO TABS: 650.0000 mg | ORAL_TABLET | Freq: Four times a day (QID) | ORAL | Status: DC | PRN

## 2014-05-02 MED ORDER — ENALAPRIL MALEATE 20 MG PO TABS
20.0000 mg | ORAL_TABLET | Freq: Two times a day (BID) | ORAL | Status: DC
  Administered 2014-05-02 – 2014-05-04 (×4): 20 mg via ORAL
  Filled 2014-05-02 (×5): qty 1

## 2014-05-02 MED ORDER — SODIUM CHLORIDE 0.9 % IJ SOLN
3.0000 mL | Freq: Two times a day (BID) | INTRAMUSCULAR | Status: DC
  Administered 2014-05-02 – 2014-05-03 (×3): 3 mL via INTRAVENOUS

## 2014-05-02 MED ORDER — ONDANSETRON HCL 4 MG/2ML IJ SOLN: 4.0000 mg | Freq: Three times a day (TID) | INTRAMUSCULAR | Status: DC | PRN

## 2014-05-02 MED ORDER — NITROGLYCERIN 0.4 MG SL SUBL
0.4000 mg | SUBLINGUAL_TABLET | SUBLINGUAL | Status: DC | PRN
  Administered 2014-05-02: 0.4 mg via SUBLINGUAL

## 2014-05-02 MED ORDER — ONDANSETRON HCL 4 MG/2ML IJ SOLN
4.0000 mg | Freq: Once | INTRAMUSCULAR | Status: AC
  Administered 2014-05-02: 4 mg via INTRAVENOUS
  Filled 2014-05-02: qty 2

## 2014-05-02 MED ORDER — FLUORESCEIN SODIUM 1 MG OP STRP
1.0000 | ORAL_STRIP | Freq: Once | OPHTHALMIC | Status: AC
  Administered 2014-05-02: 1 via OPHTHALMIC
  Filled 2014-05-02: qty 1

## 2014-05-02 MED ORDER — HYDROMORPHONE HCL 1 MG/ML IJ SOLN
1.0000 mg | INTRAMUSCULAR | Status: DC | PRN
  Administered 2014-05-02: 1 mg via INTRAVENOUS
  Filled 2014-05-02: qty 1

## 2014-05-02 MED ORDER — NITROGLYCERIN 0.4 MG SL SUBL
SUBLINGUAL_TABLET | SUBLINGUAL | Status: AC
  Filled 2014-05-02: qty 1

## 2014-05-02 MED ORDER — ONDANSETRON HCL 4 MG/2ML IJ SOLN
4.0000 mg | Freq: Four times a day (QID) | INTRAMUSCULAR | Status: DC | PRN
  Administered 2014-05-02: 4 mg via INTRAVENOUS
  Filled 2014-05-02 (×2): qty 2

## 2014-05-02 MED ORDER — ENOXAPARIN SODIUM 40 MG/0.4ML ~~LOC~~ SOLN
40.0000 mg | SUBCUTANEOUS | Status: DC
  Administered 2014-05-02 – 2014-05-03 (×2): 40 mg via SUBCUTANEOUS
  Filled 2014-05-02 (×3): qty 0.4

## 2014-05-02 MED ORDER — HYDROMORPHONE HCL 1 MG/ML IJ SOLN
1.0000 mg | Freq: Once | INTRAMUSCULAR | Status: AC
  Administered 2014-05-02: 1 mg via INTRAVENOUS
  Filled 2014-05-02: qty 1

## 2014-05-02 MED ORDER — PANTOPRAZOLE SODIUM 40 MG PO TBEC
40.0000 mg | DELAYED_RELEASE_TABLET | Freq: Every day | ORAL | Status: DC
  Administered 2014-05-02 – 2014-05-04 (×3): 40 mg via ORAL
  Filled 2014-05-02 (×3): qty 1

## 2014-05-02 MED ORDER — CARVEDILOL 25 MG PO TABS
25.0000 mg | ORAL_TABLET | Freq: Two times a day (BID) | ORAL | Status: DC
  Administered 2014-05-03 – 2014-05-04 (×3): 25 mg via ORAL
  Filled 2014-05-02 (×5): qty 1

## 2014-05-02 MED ORDER — NITROGLYCERIN 0.4 MG SL SUBL: 0.4000 mg | SUBLINGUAL_TABLET | SUBLINGUAL | Status: DC | PRN

## 2014-05-02 MED ORDER — FUROSEMIDE 20 MG PO TABS
20.0000 mg | ORAL_TABLET | Freq: Every day | ORAL | Status: DC
  Administered 2014-05-02 – 2014-05-04 (×3): 20 mg via ORAL
  Filled 2014-05-02 (×3): qty 1

## 2014-05-02 MED ORDER — ASPIRIN 81 MG PO CHEW
324.0000 mg | CHEWABLE_TABLET | Freq: Once | ORAL | Status: AC
  Administered 2014-05-02: 324 mg via ORAL

## 2014-05-02 MED ORDER — PROPARACAINE HCL 0.5 % OP SOLN
1.0000 [drp] | Freq: Once | OPHTHALMIC | Status: AC
  Administered 2014-05-02: 1 [drp] via OPHTHALMIC
  Filled 2014-05-02: qty 15

## 2014-05-02 MED ORDER — ASPIRIN EC 81 MG PO TBEC
81.0000 mg | DELAYED_RELEASE_TABLET | Freq: Every day | ORAL | Status: DC
  Administered 2014-05-03 – 2014-05-04 (×2): 81 mg via ORAL
  Filled 2014-05-02 (×2): qty 1

## 2014-05-02 MED ORDER — SODIUM CHLORIDE 0.9 % IV SOLN
Freq: Once | INTRAVENOUS | Status: AC
  Administered 2014-05-02: 12:00:00 via INTRAVENOUS

## 2014-05-02 MED ORDER — PREDNISOLONE ACETATE 1 % OP SUSP
1.0000 [drp] | Freq: Three times a day (TID) | OPHTHALMIC | Status: DC
  Administered 2014-05-02 – 2014-05-04 (×5): 1 [drp] via OPHTHALMIC
  Filled 2014-05-02 (×3): qty 1

## 2014-05-02 NOTE — ED Notes (Signed)
PA at the bedside.

## 2014-05-02 NOTE — ED Provider Notes (Signed)
CSN: [id number]     Arrival date & time 05/02/14  1230 History   First MD Initiated Contact with Patient 05/02/14 1233     Chief Complaint  Patient presents with  . 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  Patient describes a left chest pressure starting on Saturday (2 days ago), waxing and waning, nonradiating, worse with activity, mild shortness of breath. No diaphoresis or nausea. Patient was seen at urgent care and had abnormal EKG that appeared worse than one performed in December 2015. Patient was given aspirin at [facility]. He was also given nitroglycerin with mild relief of chest pain. Patient has been off of his medications for at least 5 weeks.  Patient also complains of acute onset of right eye pain, redness, photophobia, decreased visual acuity with blurry vision starting 2 days ago. Patient denies recent injury. Typically he is only able to see shapes out of right eye at baseline. He cannot read with this eye at baseline. States vision is worse than baseline since pain began. Eye doctor is Jacqualine Mau at [facility]. Patient first needed corneal transplant in 2010 in [place] after being involved in an accident and sustaining 'fracture' of eye. Then had lens implantation at [facility] with cataract extraction in 2015.   The history is provided by the patient, medical records and a relative. The history is limited by a language barrier. A language interpreter was used.    Past Medical History  Diagnosis Date  . CHF (congestive heart failure)   . Hypertension   . Chronic back pain     "neck down" (07/20/2013)   Past Surgical History  Procedure Laterality Date  . Eye surgery Right   . Corneal  transplant Right ~ 2010  . Cataract extraction w/ intraocular lens implant Right 02/16/2014  . Cardiac catheterization  2014?   No family history on file. History  Substance Use Topics  . Smoking status: Never Smoker   . Smokeless tobacco: Never Used  . Alcohol Use: No    Review of Systems  Constitutional: Negative for fever and diaphoresis.  Eyes: Positive for photophobia, pain, redness and visual disturbance. Negative for discharge and itching.  Respiratory: Positive for cough (one month) and shortness of breath.   Cardiovascular: Positive for chest pain. Negative for palpitations and leg swelling.  Gastrointestinal: Negative for nausea, vomiting and abdominal pain.  Genitourinary: Negative for dysuria.  Musculoskeletal: Negative for back pain and neck pain.  Skin: Negative for rash.  Neurological: Negative for syncope and light-headedness.    Allergies  Review of patient's allergies indicates no known allergies.  Home Medications   Prior to Admission medications   Medication Sig Start Date End Date Taking? Authorizing Provider  aspirin EC 81 MG tablet Take 1 tablet (81 mg total) by mouth daily. Patient not taking: Reported on 03/19/2014 07/20/13   Ripudeep Jenna Luo, MD  atenolol (TENORMIN) 50 MG tablet Take 50 mg by mouth daily.    Historical Provider, MD  benazepril-hydrochlorthiazide (LOTENSIN HCT) 20-12.5 MG per tablet Take 1 tablet by mouth daily.    Historical Provider, MD  carvedilol (COREG) 25 MG tablet Take 25 mg by mouth 2 (two) times daily with a meal.    Historical Provider, MD  enalapril (VASOTEC) 20 MG tablet  Take 40 mg by mouth daily.    Historical Provider, MD  enalapril (VASOTEC) 20 MG tablet Take 20 mg by mouth daily.    Historical Provider, MD  furosemide (LASIX) 20 MG tablet Take 1 tablet (20 mg total) by mouth daily. Patient not taking: Reported on 03/19/2014 07/21/13   Ripudeep Jenna Luo, MD  hydrochlorothiazide (HYDRODIURIL) 25 MG tablet Take 25 mg by mouth  daily.    Historical Provider, MD  oxyCODONE-acetaminophen (PERCOCET) 5-325 MG per tablet Take 1 tablet by mouth every 6 (six) hours as needed. 03/19/14   Courtney A Forcucci, PA-C  prednisoLONE acetate (PRED FORTE) 1 % ophthalmic suspension Place 1 drop into the right eye 3 (three) times daily.    Historical Provider, MD  ranitidine (ZANTAC) 150 MG tablet Take 150 mg by mouth at bedtime.    Historical Provider, MD   BP 140/86 mmHg  Pulse 76  Temp(Src) 98.3 F (36.8 C) (Oral)  Resp 19  SpO2 99%   Physical Exam  Constitutional: He appears well-developed and well-nourished.  HENT:  Head: Normocephalic and atraumatic.  Mouth/Throat: Mucous membranes are normal. Mucous membranes are not dry.  Eyes: Pupils are equal, round, and reactive to light. Right eye exhibits chemosis. Right eye exhibits no discharge. No foreign body present in the right eye. Right conjunctiva is injected. Right conjunctiva has no hemorrhage.  Slit lamp exam:      The right eye shows fluorescein uptake (diffuse). The right eye shows no corneal abrasion and no corneal ulcer.  Previous R corneal transplant. Conjunctiva injected. Not able to tell number of fingers at 3 feet with R eye. Diffuse areas of uptake on fluorescein exam. Patient with pain in R eye with light shined into L eye.   Neck: Trachea normal and normal range of motion. Neck supple. Normal carotid pulses and no JVD present. No muscular tenderness present. Carotid bruit is not present. No tracheal deviation present.  Cardiovascular: Normal rate, regular rhythm, S1 normal, S2 normal, normal heart sounds and intact distal pulses.  Exam reveals no distant heart sounds and no decreased pulses.   No murmur heard. Pulmonary/Chest: Effort normal and breath sounds normal. No respiratory distress. He has no wheezes. He exhibits no tenderness.  Abdominal: Soft. Normal aorta and bowel sounds are normal. There is no tenderness. There is no rebound and no guarding.   Musculoskeletal: He exhibits no edema.  Neurological: He is alert.  Skin: Skin is warm and dry. He is not diaphoretic. No cyanosis. No pallor.  Psychiatric: He has a normal mood and affect.  Nursing note and vitals reviewed.   ED Course  Procedures (including critical care time) Labs Review  Labs Reviewed  BASIC METABOLIC PANEL - Abnormal; Notable for the following:    Glucose, Bld 103 (*)    All other components within normal limits  CBC  I-STAT TROPOININ, ED  Rosezena Sensor, ED    Imaging Review Dg Chest Port 1 View  05/02/2014   CLINICAL DATA:  Left chest pain.  Left hand numbness.  EXAM: PORTABLE CHEST - 1 VIEW  COMPARISON:  PA and lateral chest 03/18/2014.  FINDINGS: Lung volumes are lower than on the comparison study with crowding of the bronchovascular structures. Lungs are clear. Heart size is normal. No pneumothorax or pleural effusion.  IMPRESSION: No acute disease.   Electronically Signed   By: Drusilla Kanner M.D.   On: 05/02/2014 13:41     EKG Interpretation   Date/Time:  Monday May 02 2014 12:39:27  EST Ventricular Rate:  77 PR Interval:  152 QRS Duration: 100 QT Interval:  416 QTC Calculation: 471 R Axis:   11 Text Interpretation:  Sinus rhythm Ventricular premature complex Probable  left ventricular hypertrophy Abnormal T, consider ischemia, diffuse leads  Anterior ST elevation, probably due to LVH No significant change since  last tracing Confirmed by ALLEN  MD, ANTHONY ([id number]) on 05/02/2014 1:22:09  PM       12:58 PM Patient seen and examined. EKG reviewed with Dr. Freida Busman. Work-up initiated. Medications ordered.   Vital signs reviewed and are as follows: BP 140/86 mmHg  Pulse 76  Temp(Src) 98.3 F (36.8 C) (Oral)  Resp 19  SpO2 99%  1:44 PM Two drops of proparacaine instilled into affected eye.   Fluorescein strip applied to affected eye. Wood's lamp used to assess for corneal abrasion. No corneal abrasion or ulcer identified. No foreign  bodies noted. No visible hyphema.   Tonometry performed. Right eye pressure: 21  Patient tolerated procedure well without immediate complication.   4:41 PM Patient seen prior by Dr. Freida Busman. Will admit for CP. May need his eye checked while inpatient.   Spoke with IMTS who will see.    MDM   Final diagnoses:  Chest pain, unspecified chest pain type  Eye pain, right   Admit.     Renne Crigler, PA-C 05/02/14 1642

## 2014-05-02 NOTE — ED Provider Notes (Signed)
Medical screening examination/treatment/procedure(s) were performed by non-physician practitioner and as supervising physician I was immediately available for consultation/collaboration.   EKG Interpretation   Date/Time:  Monday May 02 2014 12:39:27 EST Ventricular Rate:  77 PR Interval:  152 QRS Duration: 100 QT Interval:  416 QTC Calculation: 471 R Axis:   11 Text Interpretation:  Sinus rhythm Ventricular premature complex Probable  left ventricular hypertrophy Abnormal T, consider ischemia, diffuse leads  Anterior ST elevation, probably due to LVH No significant change since  last tracing Confirmed by Muath Hallam  MD, Golda Zavalza ([id number]) on 05/02/2014 1:22:09  PM     Patient here after being seen at urgent care for left sided chest pain. Has had sporadic right eye pain which is improved at this time. Patient's vision is at baseline currently Will be admitted for cardiac evaluation and may require ophthalmology consultation.  Toy Baker, MD 05/02/14 [phone]

## 2014-05-02 NOTE — ED Notes (Signed)
[facility] here to transport to main ED

## 2014-05-02 NOTE — ED Notes (Signed)
Xray at the bedside.

## 2014-05-02 NOTE — ED Notes (Addendum)
Phlebotomy and PA at the bedside.  

## 2014-05-02 NOTE — ED Notes (Addendum)
Per [facility], Patient is coming from Urgent Care. Started to have left sided Chest Pain about 4-5 days ago. Patient was given 1 SL Nitro and 324 mg of Aspirin at [facility] and 1 SL Nitro with [facility]. Vitals per [facility] 148/95, 78 HR, 97 % on RA. Patient's pain started at 6/10 down to 4/10 with Nitro. Patient has HX of HTN, CHF, and back pain and has not taken HTN meds in 5 weeks. Patient had lense and corneal transplant in the right eye one year ago. UCC reports eye being red and needing an assessment. Patient reported right eye pain, Photophobia, decreased visual acuity, double and blurred vision. Symptoms started 48 hours ago.

## 2014-05-02 NOTE — ED Notes (Addendum)
C/o unable to get refills from his MD in [place] for his BP, and has not taken his Rx since December. History of cataract surgery and lens transplant 02-2013 left eye. C/o right eye pain , red , inflamed. Pain in chest "4"

## 2014-05-02 NOTE — H&P (Signed)
Date: 05/02/2014               Patient Name:  Phillip [name] MRN: [id number]  DOB: 1965/09/26 Age / Sex: 49 y.o., male   PCP: No Pcp Per Patient              Medical Service: Internal Medicine Teaching Service              Attending Physician: Dr. Burns Spain, MD    First Contact: Dr. Glenard Haring Pager: [phone]  Second Contact: Dr. Johna Roles Pager: [phone]            After Hours (After 5p/  First Contact Pager: [phone]  weekends / holidays): Second Contact Pager: [phone]   Chief Complaint:  Chest pain  History of Present Illness: [name] is a 49 year old Spanish-speaking man with history of hypertension, GERD, and corneal transplant presenting with chest pain. The history was obtained with the help of his son who speaks [place].  He reports left squeezing chest pressure for the last 2 days that has been waxing and waning without radiation. He says the chest pain is worse with activity and is associated with mild shortness of breath and nausea. He has also noticed some swelling of his abdomen, orthopnea, PND, and a dry cough. He denies fevers, chills, lower extremity edema, or diaphoresis. He says that he's been diagnosed with congestive heart failure in the past.  He says he had a normal cardiac catheterization in November 2014.  He's also had acute right eye pain associated with redness and photophobia along with blurry vision for the last couple of weeks it is worsened over the last 2 days.  His vision has gotten worse over the last month, and he is only able to see shapes out of his right eye.  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 He had a cataract extraction in his right eye in 2015.  He says he has not seen his ophthalmologist in over a year. He has not been able to take any of his medications including his eyedrops for the last 5 weeks because he ran out.  In the ER, EKG  showed T-wave changes and troponin was negative. He was given Dilaudid for his chest pain and Zofran for nausea.  No corneal abrasions or ulcers in his R eye were identified with fluorescein exam and eye pressure was normal. He was admitted for ACS rule out.  Review of Systems: Review of Systems  Constitutional: Negative for fever, chills, weight loss and diaphoresis.  HENT: Negative for congestion and sore throat.   Eyes: Positive for blurred vision, photophobia and pain.  Respiratory: Positive for cough and shortness of breath. Negative for hemoptysis, sputum production and wheezing.   Cardiovascular: Positive for orthopnea and PND. Negative for chest pain and leg swelling.  Gastrointestinal: Positive for nausea. Negative for vomiting, abdominal pain, diarrhea and constipation.  Genitourinary: Negative for dysuria.  Musculoskeletal: Negative for myalgias and joint pain.  Skin: Negative for rash.  Neurological: Positive for headaches (Associated with eye pain.). Negative for dizziness, sensory change, focal weakness, seizures and weakness.    Meds:  (Not in a hospital admission) No current facility-administered medications for this encounter.   Current Outpatient Prescriptions  Medication Sig Dispense Refill  . carvedilol (COREG) 25 MG tablet Take 25 mg by mouth 2 (two) times daily with a meal.    . enalapril (VASOTEC) 20 MG  tablet Take 20 mg by mouth 2 (two) times daily.     . furosemide (LASIX) 20 MG tablet Take 1 tablet (20 mg total) by mouth daily. 30 tablet   . hydrochlorothiazide (HYDRODIURIL) 25 MG tablet Take 25 mg by mouth daily.    [name] oxyCODONE-acetaminophen (PERCOCET) 5-325 MG per tablet Take 1 tablet by mouth every 6 (six) hours as needed. 10 tablet 0  . prednisoLONE acetate (PRED FORTE) 1 % ophthalmic suspension Place 1 drop into the right eye 3 (three) times daily.    . ranitidine (ZANTAC) 150 MG tablet Take 150 mg by mouth at bedtime.    [name] aspirin EC 81 MG tablet Take 1  tablet (81 mg total) by mouth daily. (Patient not taking: Reported on 03/19/2014) 30 tablet 3  . benazepril-hydrochlorthiazide (LOTENSIN HCT) 20-12.5 MG per tablet Take 1 tablet by mouth daily.      Allergies: Allergies as of 05/02/2014  . (No Known Allergies)   Past Medical History  Diagnosis Date  . CHF (congestive heart failure)   . Hypertension   . Chronic back pain     "neck down" (07/20/2013)   Past Surgical History  Procedure Laterality Date  . Eye surgery Right   . Corneal transplant Right ~ 2010  . Cataract extraction w/ intraocular lens implant Right 02/16/2014  . Cardiac catheterization  2014?   No family history on file. History   Social History  . Marital Status: Married    Spouse Name: N/A    Number of Children: N/A  . Years of Education: N/A   Occupational History  . Not on file.   Social History Main Topics  . Smoking status: Never Smoker   . Smokeless tobacco: Never Used  . Alcohol Use: No  . Drug Use: No  . Sexual Activity: Yes   Other Topics Concern  . Not on file   Social History Narrative    Physical Exam: Filed Vitals:   05/02/14 1650  BP: 123/85  Pulse: 87  Temp: 98.7  Resp: 21   Physical Exam  Constitutional: He is oriented to person, place, and time and well-developed, well-nourished, and in no distress. No distress.  HENT:  Head: Normocephalic and atraumatic.  Eyes: EOM are normal. No scleral icterus.  Red, injected right eye with hazy cornea. Pain with movement of eye. Unable to assess pupil response to light. Left eye within normal limits.  Neck: Normal range of motion. Neck supple.  Cardiovascular: Normal rate, regular rhythm and normal heart sounds.   Pulmonary/Chest: Effort normal and breath sounds normal. No respiratory distress. He has no wheezes.  Abdominal: Soft. Bowel sounds are normal. He exhibits no distension. There is no tenderness.  Musculoskeletal: Normal range of motion. He exhibits no edema or tenderness.    Neurological: He is alert and oriented to person, place, and time. No cranial nerve deficit. He exhibits normal muscle tone.  Skin: Skin is warm and dry. No rash noted. He is not diaphoretic. No erythema.    Lab results: Basic Metabolic Panel:  Recent Labs  22/97/98 1255  NA 140  K 3.7  CL 106  CO2 29  GLUCOSE 103*  BUN 18  CREATININE 0.85  CALCIUM 8.8   CBC:  Recent Labs  05/02/14 1255  WBC 8.2  HGB 13.9  HCT 42.3  MCV 86.0  PLT 295   Urine Drug Screen: Drugs of Abuse     Component Value Date/Time   LABOPIA NONE DETECTED 07/25/2013 0220   COCAINSCRNUR  NONE DETECTED 07/25/2013 0220   LABBENZ NONE DETECTED 07/25/2013 0220   AMPHETMU NONE DETECTED 07/25/2013 0220   THCU NONE DETECTED 07/25/2013 0220   LABBARB NONE DETECTED 07/25/2013 0220    Imaging results:  Dg Chest Port 1 View  05/02/2014   CLINICAL DATA:  Left chest pain.  Left hand numbness.  EXAM: PORTABLE CHEST - 1 VIEW  COMPARISON:  PA and lateral chest 03/18/2014.  FINDINGS: Lung volumes are lower than on the comparison study with crowding of the bronchovascular structures. Lungs are clear. Heart size is normal. No pneumothorax or pleural effusion.  IMPRESSION: No acute disease.   Electronically Signed   By: Drusilla Kanner M.D.   On: 05/02/2014 13:41    Other results: EKG: normal sinus rhythm, new T-wave inversion in lateral leads.  Assessment & Plan by Problem: Active Problems:   Chest pain   #Chest pain Chest pain concerning for ACS with symptoms of potential heart failure as well. Troponin negative, but T-wave inversion on EKG. Reports normal heart catheterization over a year ago. He reports a history of congestive heart failure, but echocardiogram from April 2015 was normal. He does have a history of gastroesophageal reflux disease as well, which could potentially be contributing. Moderate heart score of 4 points for age, highly suspicious chest pain, and hypertension. -Consult cardiology. -Admit  to telemetry. -Check troponins and repeat EKG in the morning. -Check BNP. -Check magnesium and phosphorus. -Check urine drug screen. -Check hemoglobin A1c and lipid panel. -Check TSH and HIV antibody. -Continue aspirin 81 mg daily. -Protonix 40 mg daily. -Zofran 4 mg every 6 hours as needed. -Morphine 2 mg every 4 hours as needed. -Heart diet, nothing by mouth after midnight for possible procedure tomorrow. -Consult PT and OT.  #Eye pain History of corneal transplant. He has not been taking his prednisolone eyedrops. This is concerning for possible rejection of the transplant. No signs of ulcers or scratches. Eye pressure normal making acute angle closure glaucoma unlikely. He follows with Dr. Jacqualine Mau at [facility]. -Restart home prednisolone 1% ophthalmic solution 3 times a day. -Consult ophthalmology in the morning.  #Hypertension Hypertensive in the ER. Has not taken his meds for the last several weeks. -Restart home Coreg 25 mg twice a day, enalapril 20 mg twice a day, furosemide 20 mg daily. -Hold HCTZ 25 mg daily.  #DVT prophylaxis -Lovenox.  Dispo: Disposition is deferred at this time, awaiting improvement of current medical problems. Anticipated discharge in approximately 1-2 day(s).   The patient does not have a current PCP (No Pcp Per Patient), therefore will be require OPC follow-up after discharge.   The patient does have transportation limitations that hinder transportation to clinic appointments.   Signed:  Luisa Dago, MD, PhD PGY-1 Internal Medicine Teaching Service Pager: [phone] 05/02/2014, 4:53 PM

## 2014-05-02 NOTE — ED Provider Notes (Signed)
CSN: [id number]     Arrival date & time 05/02/14  8527 History   First MD Initiated Contact with Patient 05/02/14 1027     Chief Complaint  Patient presents with  . Eye Problem    hypertension   (Consider location/radiation/quality/duration/timing/severity/associated sxs/prior Treatment) HPI Comments: Maggie used as Nurse, learning disability. 49 year old male who presents with a chief complaint of right eye pain, redness, photophobia, double vision and blurred vision with significantly decreased visual acuity. The symptoms started approximately 48 hours ago.  The second complaint is left chest pain for 4-5 days. He describes it as a pressure over the left chest. Is also had a cough for 4-6 weeks. For an unknown period of time but for several weeks to months he has been experiencing DOE. He has a history of CHF, HTN and without medication tx for several months.   Past Medical History  Diagnosis Date  . CHF (congestive heart failure)   . Hypertension   . Chronic back pain     "neck down" (07/20/2013)   Past Surgical History  Procedure Laterality Date  . Eye surgery Right   . Corneal transplant Right ~ 2010  . Cataract extraction w/ intraocular lens implant Right 02/16/2014  . Cardiac catheterization  2014?   No family history on file. History  Substance Use Topics  . Smoking status: Never Smoker   . Smokeless tobacco: Never Used  . Alcohol Use: No    Review of Systems  Constitutional: Positive for activity change and fatigue. Negative for fever.  HENT: Negative.   Eyes: Positive for photophobia, pain, redness and visual disturbance. Negative for discharge.  Respiratory: Positive for cough. Negative for wheezing.        DOE as above.  Cardiovascular: Positive for chest pain. Negative for palpitations and leg swelling.  Gastrointestinal: Negative.   Genitourinary: Negative.   Skin: Negative.   Neurological: Negative for dizziness, syncope, speech difficulty and numbness.    Allergies   Review of patient's allergies indicates no known allergies.  Home Medications   Prior to Admission medications   Medication Sig Start Date End Date Taking? Authorizing Provider  aspirin EC 81 MG tablet Take 1 tablet (81 mg total) by mouth daily. Patient not taking: Reported on 03/19/2014 07/20/13   Ripudeep Jenna Luo, MD  atenolol (TENORMIN) 50 MG tablet Take 50 mg by mouth daily.    Historical Provider, MD  benazepril-hydrochlorthiazide (LOTENSIN HCT) 20-12.5 MG per tablet Take 1 tablet by mouth daily.    Historical Provider, MD  carvedilol (COREG) 25 MG tablet Take 25 mg by mouth 2 (two) times daily with a meal.    Historical Provider, MD  enalapril (VASOTEC) 20 MG tablet Take 40 mg by mouth daily.    Historical Provider, MD  enalapril (VASOTEC) 20 MG tablet Take 20 mg by mouth daily.    Historical Provider, MD  furosemide (LASIX) 20 MG tablet Take 1 tablet (20 mg total) by mouth daily. Patient not taking: Reported on 03/19/2014 07/21/13   Ripudeep Jenna Luo, MD  hydrochlorothiazide (HYDRODIURIL) 25 MG tablet Take 25 mg by mouth daily.    Historical Provider, MD  oxyCODONE-acetaminophen (PERCOCET) 5-325 MG per tablet Take 1 tablet by mouth every 6 (six) hours as needed. 03/19/14   Courtney A Forcucci, PA-C  prednisoLONE acetate (PRED FORTE) 1 % ophthalmic suspension Place 1 drop into the right eye 3 (three) times daily.    Historical Provider, MD  ranitidine (ZANTAC) 150 MG tablet Take 150 mg by mouth at bedtime.  Historical Provider, MD   BP 182/84 mmHg  Pulse 82  Temp(Src) 97.2 F (36.2 C) (Oral)  Resp 16  SpO2 99% Physical Exam  Constitutional: He is oriented to person, place, and time. He appears well-developed and well-nourished. No distress.  Eyes:  Right eye with anterior chamber opacity and dilated pupil. Difficult to perform good exam due to photophobia and eye pain. Some redness to the conjunctiva and sclera. Left eye with normal pupillary reaction. Sclera clear contact type  clear. EOMI.  Neck: Normal range of motion. Neck supple.  Cardiovascular: Normal rate, regular rhythm, normal heart sounds and intact distal pulses.   Pulmonary/Chest: Effort normal and breath sounds normal. No respiratory distress. He has no wheezes. He has no rales.  Musculoskeletal:  Mild tenderness to the bilateral calves. No lower extremity edema appreciated.  Lymphadenopathy:    He has no cervical adenopathy.  Neurological: He is alert and oriented to person, place, and time. He exhibits normal muscle tone.  Skin: Skin is warm and dry. No erythema.  Psychiatric: He has a normal mood and affect. His behavior is normal.  Nursing note and vitals reviewed.   ED Course  Procedures (including critical care time) Labs Review Labs Reviewed - No data to display  Imaging Review No results found. EKG, there is a EKG from 03/18/2014. Today's EKG reveals change and T-wave repolarization. There are T-wave inversions and lead 1, lead to, lead aVF, V3, V4, V5, V6. These T-wave changes were not present on the last EKG. QRS amplitude meet criteria for left ventricular hypertrophy. No ST elevations or depressions.  MDM   1. Redness of right eye   2. Acute right eye pain   3. Visual acuity reduced   4. Photophobia of right eye   5. Chest tightness or pressure   6. DOE (dyspnea on exertion)   7. Abnormal finding on EKG    Patient will be transferred via EMS with IV, O2 and monitor. We will administer standard ASA 324 mg by mouth and nitroglycerin. Patient states he is having left chest pressure rated as a 4/10. [name] Concerned about the right symptoms. He will need ophthalmology consult as soon as possible.   Hayden Rasmussen, NP 05/02/14 1133

## 2014-05-03 ENCOUNTER — Encounter (HOSPITAL_COMMUNITY): Payer: Self-pay | Admitting: General Practice

## 2014-05-03 DIAGNOSIS — R072 Precordial pain: Secondary | ICD-10-CM

## 2014-05-03 DIAGNOSIS — I420 Dilated cardiomyopathy: Secondary | ICD-10-CM

## 2014-05-03 DIAGNOSIS — E785 Hyperlipidemia, unspecified: Secondary | ICD-10-CM | POA: Diagnosis present

## 2014-05-03 DIAGNOSIS — K219 Gastro-esophageal reflux disease without esophagitis: Secondary | ICD-10-CM | POA: Diagnosis present

## 2014-05-03 DIAGNOSIS — I10 Essential (primary) hypertension: Secondary | ICD-10-CM

## 2014-05-03 DIAGNOSIS — R079 Chest pain, unspecified: Secondary | ICD-10-CM

## 2014-05-03 DIAGNOSIS — R7301 Impaired fasting glucose: Secondary | ICD-10-CM | POA: Diagnosis present

## 2014-05-03 DIAGNOSIS — H5711 Ocular pain, right eye: Secondary | ICD-10-CM

## 2014-05-03 DIAGNOSIS — H538 Other visual disturbances: Secondary | ICD-10-CM

## 2014-05-03 LAB — TROPONIN I
TROPONIN I: 0.19 ng/mL — AB (ref <0.031)
TROPONIN I: 0.03 ng/mL (ref <0.031)
Troponin I: <0.03 ng/mL (ref <0.031)
Troponin I: <0.03 ng/mL (ref <0.031)

## 2014-05-03 LAB — TSH: TSH: 5.722 u[IU]/mL — ABNORMAL HIGH (ref 0.350–4.500)

## 2014-05-03 MED ORDER — INFLUENZA VAC SPLIT QUAD 0.5 ML IM SUSY
0.5000 mL | PREFILLED_SYRINGE | INTRAMUSCULAR | Status: AC
  Administered 2014-05-04: 0.5 mL via INTRAMUSCULAR
  Filled 2014-05-03: qty 0.5

## 2014-05-03 MED ORDER — OFLOXACIN 0.3 % OP SOLN
1.0000 [drp] | Freq: Four times a day (QID) | OPHTHALMIC | Status: DC
  Administered 2014-05-03 – 2014-05-04 (×4): 1 [drp] via OPHTHALMIC
  Filled 2014-05-03 (×3): qty 5

## 2014-05-03 MED ORDER — SIMVASTATIN 10 MG PO TABS
10.0000 mg | ORAL_TABLET | Freq: Every day | ORAL | Status: DC
  Administered 2014-05-03 – 2014-05-04 (×2): 10 mg via ORAL
  Filled 2014-05-03 (×2): qty 1

## 2014-05-03 NOTE — Progress Notes (Signed)
Subjective: Patient complains of mild chest pressure this morning. He states the pain occurs with exertion. He reports the pain in his right eye is better, but he is still experiencing blurry vision and drainage.   Objective: Vital signs in last 24 hours: Filed Vitals:   05/02/14 1701 05/02/14 1820 05/02/14 2043 05/03/14 0431  BP:  162/98 157/102 145/80  Pulse:  80 81 67  Temp:  98.7 F (37.1 C) 98.5 F (36.9 C) 98.5 F (36.9 C)  TempSrc:  Oral Oral Oral  Resp:  16 16 18   Height: 5' 9.29" (1.76 m)     Weight: 226 lb 3.2 oz (102.604 kg)     SpO2:  96% 98% 93%   Weight change:  No intake or output data in the 24 hours ending 05/03/14 0949  Physical Exam General: alert, pleasant, sitting up in bed HEENT: [place]/AT, EOMI. R eye injected, mild tearing. L eye appears normal. Mucus membranes moist. CV: RRR, no m/g/r Pulm: CTA bilaterally, breaths non-labored Abd: BS+, soft, non-tender Ext: warm, trace edema bilaterally in lower extremities Neuro: alert and oriented x 3, no focal deficits  Lab Results: Basic Metabolic Panel:  Recent Labs Lab 05/02/14 1255 05/02/14 2019  NA 140 139  K 3.7 3.5  CL 106 101  CO2 29 29  GLUCOSE 103* 106*  BUN 18 17  CREATININE 0.85 0.93  CALCIUM 8.8 9.1  MG  --  2.1  PHOS  --  3.9   Liver Function Tests:  Recent Labs Lab 05/02/14 2019  AST 25  ALT 29  ALKPHOS 69  BILITOT 0.3  PROT 7.8  ALBUMIN 3.6   CBC:  Recent Labs Lab 05/02/14 1255  WBC 8.2  HGB 13.9  HCT 42.3  MCV 86.0  PLT 295   Cardiac Enzymes: 2/8 Trop I 0.03 @ 2019 2/9 Trop I 0.03 @ 0055 2/9 Trop I <0.03 @ 0829   Fasting Lipid Panel:  Recent Labs Lab 05/02/14 2019  CHOL 213*  HDL 34*  LDLCALC 140*  TRIG 194*  CHOLHDL 6.3   Thyroid Function Tests:  Recent Labs Lab 05/02/14 2019  TSH 5.722*   Coagulation:  Recent Labs Lab 05/02/14 2019  LABPROT 13.2  INR 0.99   Alcohol Level:  Recent Labs Lab 05/02/14 2019  ETH <5    Studies/Results: Dg Chest Port 1 View  05/02/2014   CLINICAL DATA:  Left chest pain.  Left hand numbness.  EXAM: PORTABLE CHEST - 1 VIEW  COMPARISON:  PA and lateral chest 03/18/2014.  FINDINGS: Lung volumes are lower than on the comparison study with crowding of the bronchovascular structures. Lungs are clear. Heart size is normal. No pneumothorax or pleural effusion.  IMPRESSION: No acute disease.   Electronically Signed   By: Drusilla Kanner M.D.   On: 05/02/2014 13:41   Medications: I have reviewed the patient's current medications. Scheduled Meds: . aspirin EC  81 mg Oral Daily  . carvedilol  25 mg Oral BID WC  . enalapril  20 mg Oral BID  . enoxaparin (LOVENOX) injection  40 mg Subcutaneous Q24H  . furosemide  20 mg Oral Daily  . [START ON 05/04/2014] Influenza vac split quadrivalent PF  0.5 mL Intramuscular Tomorrow-1000  . pantoprazole  40 mg Oral Daily  . prednisoLONE acetate  1 drop Right Eye TID  . sodium chloride  3 mL Intravenous Q12H   Continuous Infusions:  PRN Meds:.acetaminophen **OR** acetaminophen, morphine injection, nitroGLYCERIN, ondansetron (ZOFRAN) IV Assessment/Plan: Chest Pain: Concerning for  ACS given pain brought on with exertion and T-wave inversions almost diffusely on EKG. Troponins have been negative x 3. Patient reports a normal cardiac cath about 1 year ago. Also notes history of CHF and is on heart failure medication regimen, but echo in April 2015 shows EF 55-60% and no wall motion abnormalities. BNP not significant at 104. TSH elevated at 5.722. Patient does report history of GERD and takes ranitidine at home. History most consistent with stable angina.  - Cardiology consulted for possible stress test, appreciate recommendations - Continue ASA 81 mg daily - Continue Coreg 25 mg BID - Continue Enalapril 20 mg BID - Continue Lasix 20 mg daily  - Continue Morphine 1 mg Q4H PRN - Continue Protonix 40 mg daily - Start Simvastatin 10 mg daily, can advance  to 20 mg as tolerated   Right Eye Pain with Blurry Vision: History of corneal transplant in 2010. He has not been taking his home prednisolone eyedrops for several weeks because he ran out. No evidence of ulcers or scratches and eye pressure normal per ED exam. Patient states pain is better this morning, but still experiencing blurry vision and clear discharge. Right eye still appears injected. Possibly conjunctivitis but would expect vision to be normal.  - Ophthalmology consulted, appreciate recommendations - Continue Prednisolone drops for now  Hyperlipidemia: Lipid panel shows Chol 213, Trigly 194, HDL 34, LDL 140. Will start moderate intensity statin to reduce LDL given cardiac risk. - Start Simvastatin 10 mg daily, can titrate up to 20 mg as tolerated   HTN: Patient hypertensive on admission with BP 182/84. BP 145/80 today. Patient has not been on his home medications because he ran out several weeks ago.  - Continue Coreg 25 mg BID - Continue Lasix 20 mg daily - Continue Enalapril 20 mg BID  Diet: NPO for possible stress test, Heart healthy otherwise VTE PPx: Lovenox SQ Dispo: Disposition is deferred at this time, awaiting improvement of current medical problems.  Anticipated discharge in approximately 1 day(s).   The patient does not have a current PCP (No Pcp Per Patient) and does need an [facility] hospital follow-up appointment after discharge.  The patient does not have transportation limitations that hinder transportation to clinic appointments.  .Services Needed at time of discharge: Y = Yes, Blank = No PT:   OT:   RN:   Equipment:   Other:     LOS: 1 day   Rich Number, MD 05/03/2014, 9:49 AM

## 2014-05-03 NOTE — Evaluation (Signed)
Physical Therapy Evaluation/ Discharge Patient Details Name: Phillip [name] MRN: [id number] DOB: 07/21/1965 Today's Date: 05/03/2014   History of Present Illness  [name] is a 49 y.o.male. The patient is currently admitted with some chest discomfort. EKG show old diffuse nonspecific ST-T wave changes. Troponins have been normal. His rhythm has been stable. Hx of CHF  Clinical Impression  Pt is pleasant with son and spouse present throughout and interpreting. Pt states he has had fatigue with long hall ambulation and stairs for over a year. He had increased chest pain PTA but states he currently feels in his normal state of fatigue with CP rated as 1/10 before activity and 2/10 after activity. 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 Pt educated for energy conservation, seated bathing/dressing, decreased water temp for bathing, and encouraged to increase mobility as able to tolerate in a moderate Borg scale. Pt and family verbalize understanding and no further acute needs at this time with all further needs able to be met with cardiac rehab or OPPT. Signing off with pt and family agreement.     Follow Up Recommendations Outpatient PT (prefer OP cardiac rehab but if pt not a candidate OPPT)    Equipment Recommendations  Other (comment) (shower chair)    Recommendations for Other Services       Precautions / Restrictions Precautions Precautions: None      Mobility  Bed Mobility Overal bed mobility: Modified Independent                Transfers Overall transfer level: Modified independent                  Ambulation/Gait Ambulation/Gait assistance: Independent Ambulation Distance (Feet): 400 Feet Assistive device: None Gait Pattern/deviations: Step-through pattern;Decreased stride length   Gait velocity interpretation: Below normal speed for age/gender General Gait Details: Pt with fatigue with long hall  ambulation with cues for breathing technique and reaching for rail x 2.   Stairs Stairs: Yes Stairs assistance: Modified independent (Device/Increase time) Stair Management: One rail Right;Alternating pattern;Forwards Number of Stairs: 11 General stair comments: pt with seated rest grossly 30 sec after ascending stairs due to fatigue and standing rest after descent as well  Wheelchair Mobility    Modified Rankin (Stroke Patients Only)       Balance Overall balance assessment: No apparent balance deficits (not formally assessed)                                           Pertinent Vitals/Pain Pain Assessment: 0-10 Pain Score: 2  Pain Location: chest pressure Pain Descriptors / Indicators: Pressure Pain Intervention(s): Repositioned  HR 77-90    Home Living Family/patient expects to be discharged to:: Private residence Living Arrangements: Spouse/significant other;Children Available Help at Discharge: Family;Available 24 hours/day Type of Home: Apartment Home Access: Stairs to enter Entrance Stairs-Rails: Right Entrance Stairs-Number of Steps: 12 Home Layout: One level Home Equipment: None      Prior Function Level of Independence: Independent               Hand Dominance        Extremity/Trunk Assessment   Upper Extremity Assessment: Overall WFL for tasks assessed           Lower Extremity Assessment: Overall WFL for tasks assessed  Cervical / Trunk Assessment: Normal  Communication   Communication: Prefers language other than English ([place], Son present to interpret)  Cognition Arousal/Alertness: Awake/alert Behavior During Therapy: WFL for tasks assessed/performed Overall Cognitive Status: Within Functional Limits for tasks assessed                      General Comments      Exercises        Assessment/Plan    PT Assessment All further PT needs can be met in the next venue of care  PT Diagnosis  Difficulty walking   PT Problem List Decreased activity tolerance  PT Treatment Interventions     PT Goals (Current goals can be found in the Care Plan section) Acute Rehab PT Goals PT Goal Formulation: All assessment and education complete, DC therapy    Frequency     Barriers to discharge        Co-evaluation               End of Session   Activity Tolerance: Patient tolerated treatment well Patient left: in chair;with call bell/phone within reach;with family/visitor present Nurse Communication: Mobility status         Time: 0746-0029 PT Time Calculation (min) (ACUTE ONLY): 26 min   Charges:   PT Evaluation $Initial PT Evaluation Tier I: 1 Procedure PT Treatments $Self Care/Home Management: 8-22   PT G Codes:        Melford Aase 05/03/2014, 12:17 PM Elwyn Reach, [place]

## 2014-05-03 NOTE — Evaluation (Signed)
Occupational Therapy Evaluation Patient Details Name: Phillip [name] MRN: [id number] DOB: 08-07-1965 Today's Date: 05/03/2014    History of Present Illness [name] is a 49 y.o.male. The patient is currently admitted with some chest discomfort. EKG show old diffuse nonspecific ST-T wave changes. Troponins have been normal. His rhythm has been stable. Hx of CHF   Clinical Impression   Pt performing ADL and ADL transfers independently. Pt has been experiencing fatigue for some time per son who interpreted. Discussed pacing and energy conservation strategies, breathing techniques and interspersing activity with rest periods.  No further OT needs.    Follow Up Recommendations  No OT follow up    Equipment Recommendations  None recommended by OT    Recommendations for Other Services       Precautions / Restrictions Precautions Precautions: None      Mobility Bed Mobility Overal bed mobility: Modified Independent                Transfers Overall transfer level: Independent                    Balance Overall balance assessment: Independent                                          ADL                                         General ADL Comments: Instructed in energy conservation, son interpreting.     Vision                 Additional Comments: can see light and figures, but no detail with R eye   Perception     Praxis      Pertinent Vitals/Pain Pain Assessment: No/denies pain Pain Score: 2  Pain Location: chest pressure Pain Descriptors / Indicators: Pressure Pain Intervention(s): Repositioned     Hand Dominance Right   Extremity/Trunk Assessment Upper Extremity Assessment Upper Extremity Assessment: Overall WFL for tasks assessed   Lower Extremity Assessment Lower Extremity Assessment: Overall WFL for tasks assessed   Cervical / Trunk Assessment Cervical / Trunk Assessment:  Normal   Communication Communication Communication: Prefers language other than English   Cognition Arousal/Alertness: Awake/alert Behavior During Therapy: WFL for tasks assessed/performed Overall Cognitive Status: Within Functional Limits for tasks assessed                     General Comments       Exercises       Shoulder Instructions      Home Living Family/patient expects to be discharged to:: Private residence Living Arrangements: Spouse/significant other;Children Available Help at Discharge: Family;Available 24 hours/day Type of Home: Apartment Home Access: Stairs to enter Entrance Stairs-Number of Steps: 12 Entrance Stairs-Rails: Right Home Layout: One level     Bathroom Shower/Tub: Chief Strategy Officer: Standard     Home Equipment: None          Prior Functioning/Environment Level of Independence: Independent             OT Diagnosis:     OT Problem List:     OT Treatment/Interventions:      OT Goals(Current goals can be found in the care plan section)  OT Frequency:     Barriers to D/C:            Co-evaluation              End of Session    Activity Tolerance: Patient tolerated treatment well Patient left: in bed;with call bell/phone within reach;with family/visitor present   Time: 1323-1350 OT Time Calculation (min): 27 min Charges:  OT General Charges $OT Visit: 1 Procedure OT Evaluation $Initial OT Evaluation Tier I: 1 Procedure G-Codes:    Evern Bio 05/03/2014, 1:53 PM [phone]

## 2014-05-03 NOTE — Consult Note (Signed)
Reason for consult:  Eye pain OD  HPI: Phillip [name] is an 49 y.o. male who was admitted for chest pain.  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 Consuello Masse in [place].  His right eye started hurting on Saturday and vision got worse.  He did not call an eye doctor.  He was on chronic Predforte once a day OD, but ran out in December.  He did not call for a refill  Because the phone system in [place] is in [place].  He did not keep his last eye appointments.  He last saw Dr Rosana Hoes one year ago.  When his primary team called me this morning, I asked them to start Ocuflox QID OD until I could get here.  Pain is better than it was this moring.  Pain is currently gone.  Only pain with light in the eye.  Past Medical History  Diagnosis Date  . CHF (congestive heart failure)   . Hypertension   . Chronic back pain     "neck down" (07/20/2013)  . GERD (gastroesophageal reflux disease)    Past Surgical History  Procedure Laterality Date  . Eye surgery Right   . Corneal transplant Right ~ 2010  . Cataract extraction w/ intraocular lens implant Right 02/16/2014  . Cardiac catheterization  2014?   History reviewed. No pertinent family history. Current Facility-Administered Medications  Medication Dose Route Frequency Provider Last Rate Last Dose  . acetaminophen (TYLENOL) tablet 650 mg  650 mg Oral Q6H PRN Juluis Mire, MD       Or  . acetaminophen (TYLENOL) suppository 650 mg  650 mg Rectal Q6H PRN Juluis Mire, MD      . aspirin EC tablet 81 mg  81 mg Oral Daily Marjan Rabbani, MD   81 mg at 05/03/14 1059  . carvedilol (COREG) tablet 25 mg  25 mg Oral BID WC Marjan Rabbani, MD   25 mg at 05/03/14 1544  . enalapril (VASOTEC) tablet 20 mg  20 mg Oral BID Marjan Rabbani, MD   20 mg at 05/03/14 1059  . enoxaparin (LOVENOX) injection 40 mg  40 mg Subcutaneous Q24H Marjan Rabbani, MD   40 mg at 05/02/14 2109  . furosemide (LASIX)  tablet 20 mg  20 mg Oral Daily Marjan Rabbani, MD   20 mg at 05/03/14 1059  . [START ON 05/04/2014] Influenza vac split quadrivalent PF (FLUARIX) injection 0.5 mL  0.5 mL Intramuscular Tomorrow-1000 Bartholomew Crews, MD      . morphine 2 MG/ML injection 1 mg  1 mg Intravenous Q4H PRN Marjan Rabbani, MD      . nitroGLYCERIN (NITROSTAT) SL tablet 0.4 mg  0.4 mg Sublingual Q5 min PRN Marjan Rabbani, MD      . ofloxacin (OCUFLOX) 0.3 % ophthalmic solution 1 drop  1 drop Right Eye QID Albin Felling, MD   1 drop at 05/03/14 1809  . ondansetron (ZOFRAN) injection 4 mg  4 mg Intravenous Q6H PRN Juluis Mire, MD   4 mg at 05/02/14 2006  . pantoprazole (PROTONIX) EC tablet 40 mg  40 mg Oral Daily Marjan Rabbani, MD   40 mg at 05/03/14 1059  . prednisoLONE acetate (PRED FORTE) 1 % ophthalmic suspension 1 drop  1 drop Right Eye TID Juluis Mire, MD   1 drop at 05/03/14 1545  . simvastatin (ZOCOR) tablet 10 mg  10 mg Oral QHS Albin Felling, MD      .  sodium chloride 0.9 % injection 3 mL  3 mL Intravenous Q12H Marjan Rabbani, MD   3 mL at 05/03/14 1100   No Known Allergies History   Social History  . Marital Status: Married    Spouse Name: N/A    Number of Children: N/A  . Years of Education: N/A   Occupational History  . Not on file.   Social History Main Topics  . Smoking status: Never Smoker   . Smokeless tobacco: Never Used  . Alcohol Use: No  . Drug Use: No  . Sexual Activity: Yes   Other Topics Concern  . Not on file   Social History Narrative    Review of systems: ROS +Chest pain.    Physical Exam:  Blood pressure 126/59, pulse 72, temperature 98.8 F (37.1 C), temperature source Oral, resp. rate 18, height 5' 9.29" (1.76 m), weight 102.604 kg (226 lb 3.2 oz), SpO2 98 %.   VA cc near:  OD  Hand motion.  OS  20/30  Pupils:   OD round, larger than OS, +RAPD  (Hard to see pupil through cloudy cornea)           OS round, reactive to light, no APD  IOP (T pen)  OD  13 OS   17  CVF: OD "Blurry everywhere"   OS full to CF  Motility:  OD full ductions  OS full ductions  Balance/alignment:  Right exotropia   Bedside exam:                                 OD                                       External/adnexa: Normal                                      Lids/lashes:        Normal                                      Conjunctiva        2+ injection        Cornea:              Graft in place with interrupted sutures.  No obvious broken sutures.  Moderately cloudy graft.  No epithelial deftect                  AC:                     formed                            Iris:                     Poor view        Lens:                  PCIOL  OS                                       External/adnexa: Normal                                      Lids/lashes:        Normal                                      Conjunctiva        White, quiet        Cornea:              Grossly Clear                  AC:                     Deep, quiet                                Iris:                     Normal        Lens:                  NS         Labs/studies: Results for orders placed or performed during the hospital encounter of 05/02/14 (from the past 48 hour(s))  CBC     Status: None   Collection Time: 05/02/14 12:55 PM  Result Value Ref Range   WBC 8.2 4.0 - 10.5 K/uL   RBC 4.92 4.22 - 5.81 MIL/uL   Hemoglobin 13.9 13.0 - 17.0 g/dL   HCT 42.3 39.0 - 52.0 %   MCV 86.0 78.0 - 100.0 fL   MCH 28.3 26.0 - 34.0 pg   MCHC 32.9 30.0 - 36.0 g/dL   RDW 14.8 11.5 - 15.5 %   Platelets 295 150 - 400 K/uL  Basic metabolic panel     Status: Abnormal   Collection Time: 05/02/14 12:55 PM  Result Value Ref Range   Sodium 140 135 - 145 mmol/L   Potassium 3.7 3.5 - 5.1 mmol/L   Chloride 106 96 - 112 mmol/L   CO2 29 19 - 32 mmol/L   Glucose, Bld 103 (H) 70 - 99 mg/dL   BUN 18 6 - 23 mg/dL   Creatinine, Ser 0.85 0.50 - 1.35 mg/dL    Calcium 8.8 8.4 - 10.5 mg/dL   GFR calc non Af Amer >90 >90 mL/min   GFR calc Af Amer >90 >90 mL/min    Comment: (NOTE) The eGFR has been calculated using the CKD EPI equation. This calculation has not been validated in all clinical situations. eGFR's persistently <90 mL/min signify possible Chronic Kidney Disease.    Anion gap 5 5 - 15  I-stat troponin, ED (not at [facility])     Status: None   Collection Time: 05/02/14  1:05 PM  Result Value Ref Range   Troponin i, poc 0.02 0.00 - 0.08 ng/mL   Comment 3            Comment: Due to  the release kinetics of cTnI, a negative result within the first hours of the onset of symptoms does not rule out myocardial infarction with certainty. If myocardial infarction is still suspected, repeat the test at appropriate intervals.   I-stat troponin, ED     Status: None   Collection Time: 05/02/14  4:39 PM  Result Value Ref Range   Troponin i, poc 0.01 0.00 - 0.08 ng/mL   Comment 3            Comment: Due to the release kinetics of cTnI, a negative result within the first hours of the onset of symptoms does not rule out myocardial infarction with certainty. If myocardial infarction is still suspected, repeat the test at appropriate intervals.   Lipid panel     Status: Abnormal   Collection Time: 05/02/14  8:19 PM  Result Value Ref Range   Cholesterol 213 (H) 0 - 200 mg/dL   Triglycerides 194 (H) <150 mg/dL   HDL 34 (L) >39 mg/dL   Total CHOL/HDL Ratio 6.3 RATIO   VLDL 39 0 - 40 mg/dL   LDL Cholesterol 140 (H) 0 - 99 mg/dL    Comment:        Total Cholesterol/HDL:CHD Risk Coronary Heart Disease Risk Table                     Men   Women  1/2 Average Risk   3.4   3.3  Average Risk       5.0   4.4  2 X Average Risk   9.6   7.1  3 X Average Risk  23.4   11.0        Use the calculated Patient Ratio above and the CHD Risk Table to determine the patient's CHD Risk.        ATP III CLASSIFICATION (LDL):  <100     mg/dL   Optimal  100-129   mg/dL   Near or Above                    Optimal  130-159  mg/dL   Borderline  160-189  mg/dL   High  >190     mg/dL   Very High   Brain natriuretic peptide     Status: Abnormal   Collection Time: 05/02/14  8:19 PM  Result Value Ref Range   B Natriuretic Peptide 104.3 (H) 0.0 - 100.0 pg/mL  Comprehensive metabolic panel     Status: Abnormal   Collection Time: 05/02/14  8:19 PM  Result Value Ref Range   Sodium 139 135 - 145 mmol/L   Potassium 3.5 3.5 - 5.1 mmol/L   Chloride 101 96 - 112 mmol/L   CO2 29 19 - 32 mmol/L   Glucose, Bld 106 (H) 70 - 99 mg/dL   BUN 17 6 - 23 mg/dL   Creatinine, Ser 0.93 0.50 - 1.35 mg/dL   Calcium 9.1 8.4 - 10.5 mg/dL   Total Protein 7.8 6.0 - 8.3 g/dL   Albumin 3.6 3.5 - 5.2 g/dL   AST 25 0 - 37 U/L   ALT 29 0 - 53 U/L   Alkaline Phosphatase 69 39 - 117 U/L   Total Bilirubin 0.3 0.3 - 1.2 mg/dL   GFR calc non Af Amer >90 >90 mL/min   GFR calc Af Amer >90 >90 mL/min    Comment: (NOTE) The eGFR has been calculated using the CKD EPI equation. This calculation has not been validated  in all clinical situations. eGFR's persistently <90 mL/min signify possible Chronic Kidney Disease.    Anion gap 9 5 - 15  Protime-INR     Status: None   Collection Time: 05/02/14  8:19 PM  Result Value Ref Range   Prothrombin Time 13.2 11.6 - 15.2 seconds   INR 0.99 0.00 - 1.49  Magnesium     Status: None   Collection Time: 05/02/14  8:19 PM  Result Value Ref Range   Magnesium 2.1 1.5 - 2.5 mg/dL  Phosphorus     Status: None   Collection Time: 05/02/14  8:19 PM  Result Value Ref Range   Phosphorus 3.9 2.3 - 4.6 mg/dL  Ethanol     Status: None   Collection Time: 05/02/14  8:19 PM  Result Value Ref Range   Alcohol, Ethyl (B) <5 0 - 9 mg/dL    Comment:        LOWEST DETECTABLE LIMIT FOR SERUM ALCOHOL IS 11 mg/dL FOR MEDICAL PURPOSES ONLY   Troponin I     Status: None   Collection Time: 05/02/14  8:19 PM  Result Value Ref Range   Troponin I 0.03 <0.031  ng/mL    Comment:        NO INDICATION OF MYOCARDIAL INJURY.   TSH     Status: Abnormal   Collection Time: 05/02/14  8:19 PM  Result Value Ref Range   TSH 5.722 (H) 0.350 - 4.500 uIU/mL  Troponin I     Status: None   Collection Time: 05/03/14 12:55 AM  Result Value Ref Range   Troponin I 0.03 <0.031 ng/mL    Comment:        NO INDICATION OF MYOCARDIAL INJURY.   Troponin I     Status: None   Collection Time: 05/03/14  8:29 AM  Result Value Ref Range   Troponin I <0.03 <0.031 ng/mL    Comment:        NO INDICATION OF MYOCARDIAL INJURY.   Troponin I     Status: Abnormal   Collection Time: 05/03/14  1:30 PM  Result Value Ref Range   Troponin I 0.19 (H) <0.031 ng/mL    Comment:        PERSISTENTLY INCREASED TROPONIN VALUES IN THE RANGE OF 0.04-0.49 ng/mL CAN BE SEEN IN:       -UNSTABLE ANGINA       -CONGESTIVE HEART FAILURE       -MYOCARDITIS       -CHEST TRAUMA       -ARRYHTHMIAS       -LATE PRESENTING MYOCARDIAL INFARCTION       -COPD   CLINICAL FOLLOW-UP RECOMMENDED.    Dg Chest Port 1 View  05/02/2014   CLINICAL DATA:  Left chest pain.  Left hand numbness.  EXAM: PORTABLE CHEST - 1 VIEW  COMPARISON:  PA and lateral chest 03/18/2014.  FINDINGS: Lung volumes are lower than on the comparison study with crowding of the bronchovascular structures. Lungs are clear. Heart size is normal. No pneumothorax or pleural effusion.  IMPRESSION: No acute disease.   Electronically Signed   By: Inge Rise M.D.   On: 05/02/2014 13:41                             Assessment and Plan: Eye inflammation with possible graft failure and/or infection status post corneal transplant.  Patient not compliant with topical steroids or follow-up exams.  My exam  is limited by lack of slit lamp at bedside here in hospital.  THIS PATIENT NEEDS TO BE SEEN AS AN OUTPATIENT ASAP AFTER DISCHARGE, PREFERABLY BY HIS CORNEAL SURGEON IN [place]. (If this cannot be arranged, I or my partners will see him  at [facility].  However, he is better off seeing the corneal specialist wiho did his surgery and knows his baseline.  [facility] does have a walk-in clinic.)   I recommend continuing Ocuflox qid OD and adding PredForte QID OD until he sees his doctor.     All of the above information was relayed to the patient and/or patient family.  Ophthalmic warning signs and symptoms were reviewed, and clear instructions for immediate phone contact and/or immediate return to the ED or clinic were provided should any of these signs or symptoms occur.  Follow up contact information was provided.  All questions were answered.   [place] L 05/03/2014, 8:07 PM  [facility] [phone]

## 2014-05-03 NOTE — Progress Notes (Signed)
  Echocardiogram 2D Echocardiogram has been performed.  Arvil Chaco 05/03/2014, 10:48 AM

## 2014-05-03 NOTE — Consult Note (Signed)
CARDIOLOGY CONSULT NOTE   Patient ID: Phillip [name] MRN: [id number] DOB/AGE: 51-Apr-1967 51 y.o.  Admit Date: 05/02/2014  Primary Physician: No PCP Per Patient  Primary Cardiologist  Seen by Dr Rennis Golden in consultation [facility] in April 2015.   Clinical Summary [name] is a 49 y.o.male. The patient is currently admitted with some chest discomfort. EKG show old diffuse nonspecific ST-T wave changes. Troponins have been normal. His rhythm has been stable. The patient has been admitted with chest discomfort in the past. In April, 2015, he was seen for cardiology consultation by Dr. Rennis Golden. At that time information was obtained from [facility] showing that the patient hasn't had an abnormal stress PET scan that was followed by cardiac catheterization. Catheterization revealed normal coronary arteries. I do not have data from that time concerning left ventricular chamber size. The patient was seen in consultation in April, 2015, an echo was done. The official report says that the EF is 50-55%. There is, at that there is abnormal septal motion. Review of the study shows that the left ventricular chamber size at that time was increased to 59 mm. The patient is on medications for left ventricular dysfunction, however it is my understanding that he has not been on his medications for several weeks. As far as I can tell he has not seen the cardiologist back in [facility] and has not been seen for cardiology follow-up in this community. The interpreting son tells me that the patient does live here in [place].  Several days ago the patient began having vague chest discomfort. He is also felt poorly in general. He is admitted for further evaluation. He has some vague continuing chest discomfort in the hospital. Troponins are normal.   No Known Allergies  Medications Scheduled Medications: . aspirin EC  81 mg Oral Daily  . carvedilol  25 mg Oral BID WC  . enalapril  20 mg Oral BID  .  enoxaparin (LOVENOX) injection  40 mg Subcutaneous Q24H  . furosemide  20 mg Oral Daily  . [START ON 05/04/2014] Influenza vac split quadrivalent PF  0.5 mL Intramuscular Tomorrow-1000  . pantoprazole  40 mg Oral Daily  . prednisoLONE acetate  1 drop Right Eye TID  . simvastatin  10 mg Oral QHS  . sodium chloride  3 mL Intravenous Q12H     Infusions:     PRN Medications:  acetaminophen **OR** acetaminophen, morphine injection, nitroGLYCERIN, ondansetron (ZOFRAN) IV   Past Medical History  Diagnosis Date  . CHF (congestive heart failure)   . Hypertension   . Chronic back pain     "neck down" (07/20/2013)  . GERD (gastroesophageal reflux disease)     Past Surgical History  Procedure Laterality Date  . Eye surgery Right   . Corneal transplant Right ~ 2010  . Cataract extraction w/ intraocular lens implant Right 02/16/2014  . Cardiac catheterization  2014?    There is no family history of coronary artery disease.  Social History [name] reports that he has never smoked. He has never used smokeless tobacco. [name] reports that he does not drink alcohol.  Review of Systems  Review of systems is obtained with the patient's son interpreting. The patient has no fever, chills, headache, sweats, rash, change in hearing, cough, urinary symptoms. There is a history of a corneal transplant in 1 eye. All other systems are reviewed and are negative.  Physical Examination Blood pressure 137/93, pulse 73, temperature 98.5 F (36.9 C), temperature source  Oral, resp. rate 18, height 5' 9.29" (1.76 m), weight 226 lb 3.2 oz (102.604 kg), SpO2 93 %. No intake or output data in the 24 hours ending 05/03/14 1119  The patient is oriented to person time and place in response to his son in [place]. Head is atraumatic. There is no jugulovenous distention. Lungs are clear. Respiratory effort is not labored. Cardiac exam reveals S1 and S2. Abdomen is soft. There is no  peripheral edema. There are no musculoskeletal deformities. There are no skin rashes. He has change in his eye of his corneal transplant. Neurologic is grossly intact.  Prior Cardiac Testing/Procedures  Lab Results  Basic Metabolic Panel:  Recent Labs Lab 05/02/14 1255 05/02/14 2019  NA 140 139  K 3.7 3.5  CL 106 101  CO2 29 29  GLUCOSE 103* 106*  BUN 18 17  CREATININE 0.85 0.93  CALCIUM 8.8 9.1  MG  --  2.1  PHOS  --  3.9    Liver Function Tests:  Recent Labs Lab 05/02/14 2019  AST 25  ALT 29  ALKPHOS 69  BILITOT 0.3  PROT 7.8  ALBUMIN 3.6    CBC:  Recent Labs Lab 05/02/14 1255  WBC 8.2  HGB 13.9  HCT 42.3  MCV 86.0  PLT 295    Cardiac Enzymes:  Recent Labs Lab 05/02/14 2019 05/03/14 0055 05/03/14 0829  TROPONINI 0.03 0.03 <0.03    BNP: Invalid input(s): POCBNP   Radiology: Dg Chest Port 1 View  05/02/2014   CLINICAL DATA:  Left chest pain.  Left hand numbness.  EXAM: PORTABLE CHEST - 1 VIEW  COMPARISON:  PA and lateral chest 03/18/2014.  FINDINGS: Lung volumes are lower than on the comparison study with crowding of the bronchovascular structures. Lungs are clear. Heart size is normal. No pneumothorax or pleural effusion.  IMPRESSION: No acute disease.   Electronically Signed   By: Drusilla Kanner M.D.   On: 05/02/2014 13:41     ECG: I reviewed current and old EKGs. There are old diffuse nonspecific ST-T wave changes. There is no acute change.  Telemetry: I have reviewed telemetry today for review are the ninth, 2016. There is normal sinus rhythm.   Impression and Recommendations    HTN (hypertension)    Patient has been off his medications for left ventricular dysfunction. Blood pressure was elevated on admission. Resuming his medications and helping with compliance will be very important.    Chest pain      At this point I'm not convinced that his current chest pain is ischemic in origin. However he still has vague discomfort. His  troponins are normal. I would suggest using a PPI and resuming his heart failure medications and increasing his activity during the day in the hospital. I'm not inclined to proceed with stress testing or catheterization today. If the patient feels better he can be discharged home tomorrow with outpatient cardiology follow-up with Dr. Rennis Golden. If the patient has continued chest discomfort, decision can be made tomorrow whether the plan should be changed and decision made to proceed with catheterization. We know that he had an abnormal stress PET scan in [place] in 2014. I do not think that a stress study will be helpful at this time. We also know that his coronaries were normal in 2014 by report. I have also ordered more troponins.    Congestive dilated cardiomyopathy     The patient has a dilated cardiomyopathy. The ejection fraction was somewhat better in April,  2015. Currently his ejection fraction may be as low as 35% with focal wall motion abnormalities. The current measurement of his left ventricle is in the range of 64 mm. This is increased from 59 mm measured in April, 2015. However he has been off his heart failure meds. At this time would be optimal to resume his heart failure meds and follow him clinically. He does not have overt CHF at this time.  Jerral Bonito, MD 05/03/2014, 11:19 AM

## 2014-05-03 NOTE — Progress Notes (Signed)
Dr. Myrtis Ser notified of Troponin 0.19 and verbalized understanding. Afleming, RN

## 2014-05-03 NOTE — Progress Notes (Signed)
Patient okay to eat per cardiology. Afleming, RN

## 2014-05-04 ENCOUNTER — Encounter: Payer: Self-pay | Admitting: Internal Medicine

## 2014-05-04 DIAGNOSIS — I5020 Unspecified systolic (congestive) heart failure: Principal | ICD-10-CM

## 2014-05-04 DIAGNOSIS — R072 Precordial pain: Secondary | ICD-10-CM

## 2014-05-04 DIAGNOSIS — E02 Subclinical iodine-deficiency hypothyroidism: Secondary | ICD-10-CM

## 2014-05-04 DIAGNOSIS — R7303 Prediabetes: Secondary | ICD-10-CM | POA: Insufficient documentation

## 2014-05-04 LAB — HEMOGLOBIN A1C
Hgb A1c MFr Bld: 5.8 % — ABNORMAL HIGH (ref 4.8–5.6)
MEAN PLASMA GLUCOSE: 120 mg/dL

## 2014-05-04 LAB — BASIC METABOLIC PANEL
Anion gap: 6 (ref 5–15)
BUN: 23 mg/dL (ref 6–23)
CO2: 29 mmol/L (ref 19–32)
Calcium: 8.8 mg/dL (ref 8.4–10.5)
Chloride: 103 mmol/L (ref 96–112)
Creatinine, Ser: 1.14 mg/dL (ref 0.50–1.35)
GFR calc Af Amer: 84 mL/min — ABNORMAL LOW (ref >90)
GFR calc non Af Amer: 73 mL/min — ABNORMAL LOW (ref >90)
GLUCOSE: 113 mg/dL — AB (ref 70–99)
POTASSIUM: 3.7 mmol/L (ref 3.5–5.1)
Sodium: 138 mmol/L (ref 135–145)

## 2014-05-04 LAB — HIV ANTIBODY (ROUTINE TESTING W REFLEX): HIV Screen 4th Generation wRfx: NONREACTIVE

## 2014-05-04 LAB — TROPONIN I
Troponin I: <0.03 ng/mL (ref <0.031)
Troponin I: <0.03 ng/mL (ref <0.031)

## 2014-05-04 LAB — T4, FREE: FREE T4: 0.84 ng/dL (ref 0.80–1.80)

## 2014-05-04 MED ORDER — SIMVASTATIN 10 MG PO TABS: 10.0000 mg | ORAL_TABLET | Freq: Every day | ORAL | Status: DC

## 2014-05-04 MED ORDER — NITROGLYCERIN 0.4 MG SL SUBL: 0.4000 mg | SUBLINGUAL_TABLET | SUBLINGUAL | Status: DC | PRN

## 2014-05-04 MED ORDER — FUROSEMIDE 20 MG PO TABS
20.0000 mg | ORAL_TABLET | Freq: Every day | ORAL | Status: DC
Start: 2014-05-04 — End: 2014-08-03

## 2014-05-04 MED ORDER — ENALAPRIL MALEATE 20 MG PO TABS: 20.0000 mg | ORAL_TABLET | Freq: Two times a day (BID) | ORAL | Status: DC

## 2014-05-04 MED ORDER — OFLOXACIN 0.3 % OP SOLN: 1.0000 [drp] | Freq: Four times a day (QID) | OPHTHALMIC | Status: DC

## 2014-05-04 MED ORDER — PREDNISOLONE ACETATE 1 % OP SUSP: 1.0000 [drp] | Freq: Three times a day (TID) | OPHTHALMIC | Status: DC

## 2014-05-04 MED ORDER — CARVEDILOL 25 MG PO TABS
25.0000 mg | ORAL_TABLET | Freq: Two times a day (BID) | ORAL | Status: DC
Start: 2014-05-04 — End: 2014-08-03

## 2014-05-04 MED ORDER — FUROSEMIDE 20 MG PO TABS: 20.0000 mg | ORAL_TABLET | Freq: Every day | ORAL | Status: DC

## 2014-05-04 MED ORDER — PANTOPRAZOLE SODIUM 40 MG PO TBEC: 40.0000 mg | DELAYED_RELEASE_TABLET | Freq: Every day | ORAL | Status: DC

## 2014-05-04 MED ORDER — CARVEDILOL 25 MG PO TABS: 25.0000 mg | ORAL_TABLET | Freq: Two times a day (BID) | ORAL | Status: DC

## 2014-05-04 NOTE — Progress Notes (Signed)
Utilization review completed. Anirudh Baiz, RN, BSN. 

## 2014-05-04 NOTE — Discharge Instructions (Signed)
Dolor de pecho (no específico) °(Chest Pain (Nonspecific)) °Suele ser difícil diagnosticar la causa del dolor de pecho. Siempre hay una posibilidad de que el dolor podría estar relacionado con algo grave, como un ataque al corazón o un coágulo sanguíneo en los pulmones. Debe concurrir a las visitas de control con el médico. °CUIDADOS EN EL HOGAR °· Si le dieron antibióticos, tómelos como se lo haya indicado el médico. Finalice el medicamento, aunque comience a sentirse mejor. °· Durante los días siguientes, no haga actividades que provoquen dolor de pecho. Continúe con las actividades físicas como se lo haya indicado el médico. °· No use productos que contengan tabaco, que incluyen cigarrillos, tabaco para mascar y cigarrillos electrónicos. °· Evite el consumo de alcohol. °· Tome los medicamentos solamente como se lo haya indicado el médico. °· Siga las sugerencias del médico en lo que respecta a más pruebas, si el dolor de pecho no desaparece. °· Concurra a todas las visitas que concertó con el médico. °SOLICITE AYUDA SI: °· El dolor de pecho no desaparece, incluso después del tratamiento. °· Tiene una erupción cutánea con ampollas en el pecho. °· Tiene fiebre. °SOLICITE AYUDA DE INMEDIATO SI:  °· Aumenta el dolor de pecho o el dolor se irradia hacia el brazo, el cuello, la mandíbula, la espalda o el vientre (abdomen). °· Le falta el aire. °· Tose más de lo normal o tose con sangre. °· Siente un dolor muy intenso en la espalda o el vientre. °· Tiene malestar estomacal (náuseas) o vomita. °· Se siente muy débil. °· Pierde el conocimiento (se desmaya). °· Tiene escalofríos. °Esto es una emergencia. No espere a ver que los problemas desaparezcan. Llame a los servicios de emergencia locales (911 en los [place]). No conduzca por sus propios medios hasta el hospital. °ASEGÚRESE DE QUE:  °· Comprende estas instrucciones. °· Controlará su afección. °· Recibirá ayuda de inmediato si no mejora o si empeora. °Document  Released: 06/07/2008 Document Revised: 03/16/2013 °ExitCare® Patient Information ©2015 [employer]. This information is not intended to replace advice given to you by your health care provider. Make sure you discuss any questions you have with your health care provider. ° °

## 2014-05-04 NOTE — Progress Notes (Signed)
Subjective: Patient states he is no longer having chest pain. He reports his eye pain has improved as well.   Objective: Vital signs in last 24 hours: Filed Vitals:   05/03/14 1212 05/03/14 1251 05/03/14 1936 05/04/14 0419  BP:  113/67 126/59 123/77  Pulse: 77 71 72 68  Temp:  98.4 F (36.9 C) 98.8 F (37.1 C) 97.9 F (36.6 C)  TempSrc:  Oral Oral Oral  Resp:  Height:      Weight:      SpO2:  98% 98% 98%   Weight change:   Intake/Output Summary (Last 24 hours) at 05/04/14 1153 Last data filed at 05/03/14 1800  Gross per 24 hour  Intake    960 ml  Output      0 ml  Net    960 ml   Physical Exam General: alert, pleasant, sitting up in bed HEENT: [place]/AT, EOMI, R eye mildly erythematous but improved, mucus membranes moist CV: RRR, no m/g/r Pulm: CTA bilaterally, breaths non=labored Abd: BS+, soft, non-tender Ext: warm, no edema Neuro: alert and oriented x 3, no focal deficits  Lab Results: Basic Metabolic Panel:  Recent Labs Lab 05/02/14 2019 05/04/14 0046  NA 139 138  K 3.5 3.7  CL 101 103  CO2 29 29  GLUCOSE 106* 113*  BUN 17 23  CREATININE 0.93 1.14  CALCIUM 9.1 8.8  MG 2.1  --   PHOS 3.9  --    Cardiac Enzymes:  Recent Labs Lab 05/03/14 2115 05/04/14 0046 05/04/14 0844  TROPONINI <0.03 <0.03 <0.03   Hemoglobin A1C:  Recent Labs Lab 05/02/14 2019  HGBA1C 5.8*   Fasting Lipid Panel:  Recent Labs Lab 05/02/14 2019  CHOL 213*  HDL 34*  LDLCALC 140*  TRIG 194*  CHOLHDL 6.3   Thyroid Function Tests:  Recent Labs Lab 05/02/14 2019 05/04/14 0046  TSH 5.722*  --   FREET4  --  0.84   Studies/Results: Dg Chest Port 1 View  05/02/2014   CLINICAL DATA:  Left chest pain.  Left hand numbness.  EXAM: PORTABLE CHEST - 1 VIEW  COMPARISON:  PA and lateral chest 03/18/2014.  FINDINGS: Lung volumes are lower than on the comparison study with crowding of the bronchovascular structures. Lungs are clear. Heart size is normal. No  pneumothorax or pleural effusion.  IMPRESSION: No acute disease.   Electronically Signed   By: Drusilla Kanner M.D.   On: 05/02/2014 13:41   Medications: I have reviewed the patient's current medications. Scheduled Meds: . aspirin EC  81 mg Oral Daily  . carvedilol  25 mg Oral BID WC  . enalapril  20 mg Oral BID  . enoxaparin (LOVENOX) injection  40 mg Subcutaneous Q24H  . furosemide  20 mg Oral Daily  . ofloxacin  1 drop Right Eye QID  . pantoprazole  40 mg Oral Daily  . prednisoLONE acetate  1 drop Right Eye TID  . simvastatin  10 mg Oral QHS  . sodium chloride  3 mL Intravenous Q12H   Continuous Infusions:  PRN Meds:.acetaminophen **OR** acetaminophen, morphine injection, nitroGLYCERIN, ondansetron (ZOFRAN) IV Assessment/Plan: Chest Pain: Patient with 1 mildly elevated troponin yesterday at 0.19, but then next two were negative. Likely erroneous or HF-related. Patient states his chest pain has resolved this morning.  - Cardiology to contact patient for follow up  - Continue ASA 81 mg daily - Continue Coreg 25 mg BID - Continue Enalapril 20 mg BID - Continue Lasix  20 mg daily  - Continue Morphine 1 mg Q4H PRN - Continue Protonix 40 mg daily - Continue Simvastatin 10 mg daily, can advance to 20 mg as tolerated   Dilated Cardiomyopathy: Echo on 05/03/14 showed EF 30-35% with increased size of the left ventricle and focal wall motion abnormalities. His EF has worsened since April 2015 echo. Will continue on HF meds.  - Continue ASA 81 mg daily - Continue Coreg 25 mg BID - Continue Enalapril 20 mg BID - Continue Lasix 20 mg daily  - Continue Simvastatin 10 mg daily, advance as tolerated as outpatient   Right Eye Pain with Blurry Vision: History of corneal transplant in 2014. Ophtho evaluated patient and are concerned for possible graft rejection vs. Infection. Patient has follow up with Dr. Jacqualine Mau in [place] on Feb 18th as he performed the corneal transplant.  - Continue  Ofloxacin eye drops  - Continue Prednisolone drops for now - f/u with Dr. Earlene Plater in [facility]   Subclinical Hypothyroidism: TSH elevated at 5.722 and T4 low-normal at 0.84 consistent with subclinical hypothyroidism. Patient will need repeat levels done in 3 months.   Hyperlipidemia: Lipid panel shows Chol 213, Trigly 194, HDL 34, LDL 140.  - Continue Simvastatin 10 mg daily, can titrate up to 20 mg as tolerated as outpatient   HTN: BP now well controlled in 110-130s systolic.  - Continue Coreg 25 mg BID - Continue Lasix 20 mg daily - Continue Enalapril 20 mg BID  Diet: Heart healthy VTE PPx: Lovenox SQ Dispo: Discharge today if ok by cards  The patient does not have a current PCP (No Pcp Per Patient) and does need an [facility] hospital follow-up appointment after discharge.  The patient does not have transportation limitations that hinder transportation to clinic appointments.  .Services Needed at time of discharge: Y = Yes, Blank = No PT:   OT:   RN:   Equipment:   Other:     LOS: 2 days   Rich Number, MD 05/04/2014, 11:53 AM

## 2014-05-04 NOTE — Discharge Summary (Signed)
Name: Phillip [name] MRN: [id number] DOB: Jun 08, 1965 49 y.o. PCP: No Pcp Per Patient  Date of Admission: 05/02/2014 12:30 PM Date of Discharge: 05/04/2014 Attending Physician: Burns Spain, MD  Discharge Diagnosis: Chest Pain Systolic CHF Dilated Cardiomyopathy Right Eye Pain with Blurry Vision Subclinical Hypothyroidism  Hyperlipidemia HTN  Discharge Medications:   Medication List    STOP taking these medications        benazepril-hydrochlorthiazide 20-12.5 MG per tablet  Commonly known as:  LOTENSIN HCT     hydrochlorothiazide 25 MG tablet  Commonly known as:  HYDRODIURIL     ranitidine 150 MG tablet  Commonly known as:  ZANTAC      TAKE these medications        aspirin EC 81 MG tablet  Take 1 tablet (81 mg total) by mouth daily.     carvedilol 25 MG tablet  Commonly known as:  COREG  Take 1 tablet (25 mg total) by mouth 2 (two) times daily with a meal.     enalapril 20 MG tablet  Commonly known as:  VASOTEC  Take 1 tablet (20 mg total) by mouth 2 (two) times daily.     furosemide 20 MG tablet  Commonly known as:  LASIX  Take 1 tablet (20 mg total) by mouth daily.     nitroGLYCERIN 0.4 MG SL tablet  Commonly known as:  NITROSTAT  Place 1 tablet (0.4 mg total) under the tongue every 5 (five) minutes as needed for chest pain (CP or SOB).     ofloxacin 0.3 % ophthalmic solution  Commonly known as:  OCUFLOX  Place 1 drop into the right eye 4 (four) times daily.     oxyCODONE-acetaminophen 5-325 MG per tablet  Commonly known as:  PERCOCET  Take 1 tablet by mouth every 6 (six) hours as needed.     pantoprazole 40 MG tablet  Commonly known as:  PROTONIX  Take 1 tablet (40 mg total) by mouth daily.     prednisoLONE acetate 1 % ophthalmic suspension  Commonly known as:  PRED FORTE  Place 1 drop into the right eye 3 (three) times daily.     simvastatin 10 MG tablet  Commonly known as:  ZOCOR  Take 1 tablet (10 mg total) by mouth at  bedtime.        Disposition and follow-up:   [name] was discharged from [facility] in Good condition.  At the hospital follow up visit please address:  1.  Chest Pain- Is patient still getting chest pain? Is he taking his CHF meds? R Eye pain/blurry vision- Confirm that patient will make ophtho appt on Feb 18th with Dr. Earlene Plater [facility]). Still having pain/blurry vision? HTN- recheck BP Subclinical Hypothyroidism- Will need repeat TSH/T4 in 3 months   2.  Labs / imaging needed at time of follow-up: Will need repeat TSH/T4 in 3 months  3.  Pending labs/ test needing follow-up: None   Follow-up Appointments: Internal Medicine Clinic- Dr. Virgina Organ- on Feb 15th at 8:15 AM Dr. Rennis Golden (cardiology)- office to call patient  Dr. Jacqualine Mau (ophthalmology- [facility]) on Feb 18th at 8:15 AM  Discharge Instructions: Discharge Instructions    Diet - low sodium heart healthy    Complete by:  As directed      Increase activity slowly    Complete by:  As directed            Consultations: Treatment Team:  Rounding Lbcardiology, MD  Procedures Performed:  Dg Chest Port 1 View  05/02/2014   CLINICAL DATA:  Left chest pain.  Left hand numbness.  EXAM: PORTABLE CHEST - 1 VIEW  COMPARISON:  PA and lateral chest 03/18/2014.  FINDINGS: Lung volumes are lower than on the comparison study with crowding of the bronchovascular structures. Lungs are clear. Heart size is normal. No pneumothorax or pleural effusion.  IMPRESSION: No acute disease.   Electronically Signed   By: Drusilla Kanner M.D.   On: 05/02/2014 13:41    2D Echo:  05/03/14 Impressions: - Unable to compare directly with the prior study April 2015. Normal LV wall thickness with moderate chamber dilatation and LVEF 30-35%. Diffuse hypokinesis with akinesis of the mid to basal inferoseptal and inferior walls. Acoustic shadowing and artifact obscure the LV apex - no obvious mural  thrombus, but not well visualized. Microbubble contrast study could be considered for further evaluation. Ungraded restrictive filling pattern. Upper normal left atrial size. Mild mitral regurgitation. Trivial tricuspid regurgitation with normal estimated PASP.  Admission HPI: Phillip [name] is a 49 year old Spanish-speaking man with history of hypertension, GERD, and corneal transplant presenting with chest pain. The history was obtained with the help of his son who speaks [place]. He reports left squeezing chest pressure for the last 2 days that has been waxing and waning without radiation. He says the chest pain is worse with activity and is associated with mild shortness of breath and nausea. He has also noticed some swelling of his abdomen, orthopnea, PND, and a dry cough. He denies fevers, chills, lower extremity edema, or diaphoresis. He says that he's been diagnosed with congestive heart failure in the past. He says he had a normal cardiac catheterization in November 2014. He's also had acute right eye pain associated with redness and photophobia along with blurry vision for the last couple of weeks it is worsened over the last 2 days. His vision has gotten worse over the last month, and he is only able to see shapes out of his right eye. 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 He had a cataract extraction in his right eye in 2015. He says he has not seen his ophthalmologist in over a year. He has not been able to take any of his medications including his eyedrops for the last 5 weeks because he ran out.  In the ER, EKG showed T-wave changes and troponin was negative. He was given Dilaudid for his chest pain and Zofran for nausea. No corneal abrasions or ulcers in his R eye were identified with fluorescein exam and eye pressure was normal. He was admitted for ACS rule out.  Hospital Course by  problem list:   Chest Pain in Setting of Systolic CHF: Patient presented with left-sided squeezing pressure that was worse on exertion and associated with dyspnea and nausea, concerning for ACS. In April 2015 he was noted to have abnormal stress PET scan followed by cardiac cath which showed normal coronary arteries. On admission, he was noted to have diffuse T-wave inversions which were determined to be old. Troponins negative x 3. Cardiology was consulted and did not believe his chest pain to be ischemic in nature. Troponins were cycled again because of continuing chest pain and he had 1 mildly elevated troponin at 0.19 which was determined to be erroneous. All other troponins were negative. Echocardiogram showed EF 30-35% and evidence of dilated cardiomyopathy (see below). Cardiology believed his  chest pain to be related to his CHF and no further work up was necessary at this time. By time of discharge the patient's chest pain had resolved. He was discharged on ASA 81 mg daily, Coreg 25 mg BID, Enalapril 20 mg BID, Lasix 20 mg daily, and Simvastatin 10 mg daily. He has follow up with cardiology (to be called by office) and with the IM clinic on Feb 15th.   Dilated Cardiomyopathy: Found on echo while working up chest pain. Echo on 05/03/14 showed EF 30-35% with increased size of the left ventricle and focal wall motion abnormalities. His EF had worsened since April 2015 echo. He was discharged on the medications listed above.   Right Eye Pain with Blurry Vision: Patient presented with several week history of right eye pain and blurry vision that worsened over the previous 2 days prior to admission. Patient has a history of corneal transplant in Nov 2014 by Dr. Earlene Plater in [place]. There was concern for possible corneal transplant rejection and thus ophthalmology was consulted. Ophtho recommended for him to start on Ofloxacin eye drops and Prednisolone eye drops, and to follow up with Dr. Earlene Plater ASAP. Patient's  eye pain had improved by time of discharge, but he still reported mild blurry vision. He has an appointment with Dr. Earlene Plater in [place] on Feb 18th.   Subclinical Hypothyroidism: TSH found to be elevated at 5.722 and T4 low-normal at 0.84 consistent with subclinical hypothyroidism. Patient will need repeat levels done in 3 months.   Hyperlipidemia: Lipid panel on 05/02/14 showed Chol 213, Trigly 194, HDL 34, LDL 140. Patient was started on Simvastatin 10 mg daily and discharged on this medication. His dose should be uptitrated to 20 mg daily as tolerated since he warrants a moderate intensity statin.   HTN: BP 182/84 on admission. Patient had run out of his home medications several weeks before admission. He was restarted on his home Coreg 25 mg BID, Lasix 20 mg daily, and Enalapril 20 mg BID. He was previously on Benazepril- HCTZ 20-12.5 mg daily (unclear why on 2 ACE inhibitors) and HCTZ 25 mg daily (unclear why HCTZ on med rec twice), but these medications were discontinued. His BPs improved to the 110-130s systolic. He was discharged on Coreg 25 mg BID, Enalapril 20 mg BID, and Lasix 20 mg daily. Patient to have follow up in [facility].   Discharge Vitals:   BP 128/80 mmHg  Pulse 68  Temp(Src) 98.5 F (36.9 C) (Oral)  Resp 18  Ht 5' 9.29" (1.76 m)  Wt 226 lb 3.2 oz (102.604 kg)  BMI 33.12 kg/m2  SpO2 100% Physical Exam General: alert, pleasant, sitting up in bed HEENT: [place]/AT, EOMI, R eye mildly erythematous but improved, mucus membranes moist CV: RRR, no m/g/r Pulm: CTA bilaterally, breaths non=labored Abd: BS+, soft, non-tender Ext: warm, no edema Neuro: alert and oriented x 3, no focal deficits  Discharge Labs:  Results for orders placed or performed during the hospital encounter of 05/02/14 (from the past 24 hour(s))  Troponin I     Status: None   Collection Time: 05/03/14  9:15 PM  Result Value Ref Range   Troponin I <0.03 <0.031 ng/mL  Basic metabolic panel     Status: Abnormal    Collection Time: 05/04/14 12:46 AM  Result Value Ref Range   Sodium 138 135 - 145 mmol/L   Potassium 3.7 3.5 - 5.1 mmol/L   Chloride 103 96 - 112 mmol/L   CO2 29 19 -  32 mmol/L   Glucose, Bld 113 (H) 70 - 99 mg/dL   BUN 23 6 - 23 mg/dL   Creatinine, Ser 1.61 0.50 - 1.35 mg/dL   Calcium 8.8 8.4 - 09.6 mg/dL   GFR calc non Af Amer 73 (L) >90 mL/min   GFR calc Af Amer 84 (L) >90 mL/min   Anion gap 6 5 - 15  Troponin I     Status: None   Collection Time: 05/04/14 12:46 AM  Result Value Ref Range   Troponin I <0.03 <0.031 ng/mL  T4, free     Status: None   Collection Time: 05/04/14 12:46 AM  Result Value Ref Range   Free T4 0.84 0.80 - 1.80 ng/dL  Troponin I     Status: None   Collection Time: 05/04/14  8:44 AM  Result Value Ref Range   Troponin I <0.03 <0.031 ng/mL  Troponin I     Status: None   Collection Time: 05/04/14  1:00 PM  Result Value Ref Range   Troponin I <0.03 <0.031 ng/mL    Signed: Rich Number, MD 05/04/2014, 2:57 PM    Services Ordered on Discharge: None Equipment Ordered on Discharge: None

## 2014-05-04 NOTE — Progress Notes (Signed)
Patient Name: Phillip [name] Date of Encounter: 05/04/2014     Principal Problem:   Chest pain Active Problems:   HTN (hypertension)   Congestive dilated cardiomyopathy   Hyperlipidemia   GERD (gastroesophageal reflux disease)   Acute right eye pain   Elevated fasting glucose    SUBJECTIVE  Patient interviewed with help from Nurse [facility] for translation. He speaks Spanish only.   Feeling good this morning. States he did not some chest CP yesterday around 10 pm. Also had significant SOB during physical therapy with exertion. No SOB at rest. R eye irritation has been seen by [facility]  CURRENT MEDS . aspirin EC  81 mg Oral Daily  . carvedilol  25 mg Oral BID WC  . enalapril  20 mg Oral BID  . enoxaparin (LOVENOX) injection  40 mg Subcutaneous Q24H  . furosemide  20 mg Oral Daily  . Influenza vac split quadrivalent PF  0.5 mL Intramuscular Tomorrow-1000  . ofloxacin  1 drop Right Eye QID  . pantoprazole  40 mg Oral Daily  . prednisoLONE acetate  1 drop Right Eye TID  . simvastatin  10 mg Oral QHS  . sodium chloride  3 mL Intravenous Q12H    OBJECTIVE  Filed Vitals:   05/03/14 1212 05/03/14 1251 05/03/14 1936 05/04/14 0419  BP:  113/67 126/59 123/77  Pulse: 77 71 72 68  Temp:  98.4 F (36.9 C) 98.8 F (37.1 C) 97.9 F (36.6 C)  TempSrc:  Oral Oral Oral  Resp:  20 18 18   Height:      Weight:      SpO2:  98% 98% 98%    Intake/Output Summary (Last 24 hours) at 05/04/14 0908 Last data filed at 05/03/14 1800  Gross per 24 hour  Intake    960 ml  Output      0 ml  Net    960 ml   Filed Weights   05/02/14 1701  Weight: 226 lb 3.2 oz (102.604 kg)    PHYSICAL EXAM  General: Pleasant, NAD. Neuro: Alert and oriented X 3. Moves all extremities spontaneously. Psych: Normal affect. HEENT:  Normal. R eye minimal movement, mild redness.  Neck: Supple without bruits or JVD. Lungs:  Resp regular and unlabored, CTA. Heart: RRR no s3, s4, or  murmurs. Abdomen: Soft, non-tender, non-distended, BS + x 4.  Extremities: No clubbing, cyanosis or edema. DP/PT/Radials 2+ and equal bilaterally.  Accessory Clinical Findings  CBC  Recent Labs  05/02/14 1255  WBC 8.2  HGB 13.9  HCT 42.3  MCV 86.0  PLT 295   Basic Metabolic Panel  Recent Labs  05/02/14 2019 05/04/14 0046  NA 139 138  K 3.5 3.7  CL 101 103  CO2 29 29  GLUCOSE 106* 113*  BUN 17 23  CREATININE 0.93 1.14  CALCIUM 9.1 8.8  MG 2.1  --   PHOS 3.9  --    Liver Function Tests  Recent Labs  05/02/14 2019  AST 25  ALT 29  ALKPHOS 69  BILITOT 0.3  PROT 7.8  ALBUMIN 3.6   Cardiac Enzymes  Recent Labs  05/03/14 1330 05/03/14 2115 05/04/14 0046  TROPONINI 0.19* <0.03 <0.03   Hemoglobin A1C  Recent Labs  05/02/14 2019  HGBA1C 5.8*   Fasting Lipid Panel  Recent Labs  05/02/14 2019  CHOL 213*  HDL 34*  LDLCALC 140*  TRIG 194*  CHOLHDL 6.3   Thyroid Function Tests  Recent Labs  05/02/14 2019  TSH 5.722*    TELE NSR with HR 60-80s    ECG  No new EKG  Echocardiogram 05/03/2014  LV EF: 30% -  35%  ------------------------------------------------------------------- Indications:   Chest pain 786.51.  ------------------------------------------------------------------- History:  PMH: Abnormal cardiovascular stress test. Risk factors: Hypertension.  ------------------------------------------------------------------- Study Conclusions  - Left ventricle: The cavity size was moderately dilated. Wall thickness was normal. Systolic function was moderately to severely reduced. The estimated ejection fraction was in the range of 30% to 35%. Diffuse hypokinesis. There is akinesis of the basal-midinferior and inferoseptal myocardium. Acoustic shadowing and artifact obscure the LV apex - no obvious mural thrombus, but not well visualized. Microbubble contrast study could be considered for further evaluation.  Doppler parameters are consistent with restrictive physiology, indicative of decreased left ventricular diastolic compliance and/or increased left atrial pressure. - Mitral valve: Mildly thickened leaflets . There was mild regurgitation. - Left atrium: The atrium was at the upper limits of normal in size. - Right ventricle: Systolic function was normal. - Right atrium: Central venous pressure (est): 3 mm Hg. - Atrial septum: No defect or patent foramen ovale was identified. - Tricuspid valve: There was trivial regurgitation. - Pulmonary arteries: PA peak pressure: 13 mm Hg (S). - Pericardium, extracardiac: There was no pericardial effusion.  Impressions:  - Unable to compare directly with the prior study April 2015. Normal LV wall thickness with moderate chamber dilatation and LVEF 30-35%. Diffuse hypokinesis with akinesis of the mid to basal inferoseptal and inferior walls. Acoustic shadowing and artifact obscure the LV apex - no obvious mural thrombus, but not well visualized. Microbubble contrast study could be considered for further evaluation. Ungraded restrictive filling pattern. Upper normal left atrial size. Mild mitral regurgitation. Trivial tricuspid regurgitation with normal estimated PASP.    Radiology/Studies  Dg Chest Port 1 View  05/02/2014   CLINICAL DATA:  Left chest pain.  Left hand numbness.  EXAM: PORTABLE CHEST - 1 VIEW  COMPARISON:  PA and lateral chest 03/18/2014.  FINDINGS: Lung volumes are lower than on the comparison study with crowding of the bronchovascular structures. Lungs are clear. Heart size is normal. No pneumothorax or pleural effusion.  IMPRESSION: No acute disease.   Electronically Signed   By: Drusilla Kanner M.D.   On: 05/02/2014 13:41    ASSESSMENT AND PLAN  49 yo spanish speaking male came in with CP, last cath 2014 after abnormal myoview was clean. Echo shows wall motion abnormality and declining EF compare to  previous echo. Serial trop overnight largely negative except 1 trop which was up to 0.19   1. Chest pain  - abnormal stress PET scan in [facility] 2014, normal coronaries on cath 2014  - Echo 07/20/2013 EF 55-60%, no regional wall motion abnormality  - Echo 05/03/2014 EF 30-35%, diffuse hypokinesis with akinesis of basal-mid inferior and inferiorseptal myocardium, mild MR, normal RVEF  - stress test would not be helpful in this case. Serial enzyme mainly flat, did have 1 elevated trop at 0.19 before back to normal.  - will check with MD regarding whether or not this patient should have relook cath before discharge.  2. HTN: off of his medication for LV dysfunction for several weeks. Emphasis has been placed on compliance  3. Dilated cardiomyopathy: EF down to 35%  - continue coreg, ACEI, lasix, ASA and statin  4. Mildly elevated TSH 5. 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  I have reviewed the chart, notes and new data.  I agree with PA's note.  PLAN: I think his presenting symptoms were due to CHF, not consistent with a new acute coronary event. The one (of five) abnormal troponin I is either artifactual or HF-related, not due to CAD. Recent cath with normal coronaries. I think he can go home from CV standpoint and I would allow him to return to work on Monday. Will arrange f/u in [place] so that we can ensure easy access to prescription refills.  Thurmon Fair, MD, [facility] [phone] 05/04/2014, 1:00 PM

## 2014-05-05 ENCOUNTER — Telehealth: Payer: Self-pay | Admitting: *Deleted

## 2014-05-05 DIAGNOSIS — I10 Essential (primary) hypertension: Secondary | ICD-10-CM

## 2014-05-05 DIAGNOSIS — K219 Gastro-esophageal reflux disease without esophagitis: Secondary | ICD-10-CM

## 2014-05-05 DIAGNOSIS — E785 Hyperlipidemia, unspecified: Secondary | ICD-10-CM

## 2014-05-05 MED ORDER — RANITIDINE HCL 150 MG PO TABS: 150.0000 mg | ORAL_TABLET | Freq: Every day | ORAL | Status: DC

## 2014-05-05 MED ORDER — LOVASTATIN 20 MG PO TABS: 20.0000 mg | ORAL_TABLET | Freq: Every day | ORAL | Status: DC

## 2014-05-05 MED ORDER — LISINOPRIL 40 MG PO TABS: 40.0000 mg | ORAL_TABLET | Freq: Every day | ORAL | Status: DC

## 2014-05-05 NOTE — Telephone Encounter (Signed)
Unable to afford some of discharge medications. Will switch to Lovastatin 20 mg daily ($4 at [facility]), Lisinopril 40 mg daily ($4), and Zantac ($4). Patient has ofloxacin eye drops from hospital. Also scheduled to see ophthalmologist in [facility] on Feb 18th. Prescriptions sent to [facility].

## 2014-05-05 NOTE — Telephone Encounter (Addendum)
Pt came by the clinic stating his meds were to expensive at [facility]. He can pay for the meds on the $4 list which are Carvedilol &  Lasix.   He can not pay for Simvastatin 10 mg =  $ 19.29, pantoprazole = $69.80,  Ofloxacin 0.3% eye solution = $ 49.00, Enalapril 20 mg = $27.00 They do have zantac 150 mg for $4  And pt has a sample of Prednisolone eye drops.  Pt has appointment in clinic on 2/15 Pt home # [phone]

## 2014-05-09 ENCOUNTER — Observation Stay (HOSPITAL_COMMUNITY)
Admission: EM | Admit: 2014-05-09 | Discharge: 2014-05-10 | Disposition: A | Discharge status: Home / Self Care | Payer: Self-pay | Attending: Internal Medicine | Admitting: Internal Medicine

## 2014-05-09 ENCOUNTER — Encounter (HOSPITAL_COMMUNITY): Payer: Self-pay | Admitting: Emergency Medicine

## 2014-05-09 ENCOUNTER — Ambulatory Visit: Payer: Self-pay | Admitting: Internal Medicine

## 2014-05-09 ENCOUNTER — Emergency Department (HOSPITAL_COMMUNITY): Payer: Self-pay

## 2014-05-09 DIAGNOSIS — I509 Heart failure, unspecified: Secondary | ICD-10-CM | POA: Insufficient documentation

## 2014-05-09 DIAGNOSIS — Z79899 Other long term (current) drug therapy: Secondary | ICD-10-CM | POA: Insufficient documentation

## 2014-05-09 DIAGNOSIS — Z7982 Long term (current) use of aspirin: Secondary | ICD-10-CM | POA: Insufficient documentation

## 2014-05-09 DIAGNOSIS — G8929 Other chronic pain: Secondary | ICD-10-CM | POA: Insufficient documentation

## 2014-05-09 DIAGNOSIS — I10 Essential (primary) hypertension: Secondary | ICD-10-CM | POA: Diagnosis present

## 2014-05-09 DIAGNOSIS — R0602 Shortness of breath: Secondary | ICD-10-CM

## 2014-05-09 DIAGNOSIS — Z7952 Long term (current) use of systemic steroids: Secondary | ICD-10-CM | POA: Insufficient documentation

## 2014-05-09 DIAGNOSIS — R079 Chest pain, unspecified: Secondary | ICD-10-CM | POA: Diagnosis present

## 2014-05-09 DIAGNOSIS — E785 Hyperlipidemia, unspecified: Secondary | ICD-10-CM | POA: Diagnosis present

## 2014-05-09 DIAGNOSIS — K219 Gastro-esophageal reflux disease without esophagitis: Secondary | ICD-10-CM | POA: Diagnosis present

## 2014-05-09 DIAGNOSIS — Z9889 Other specified postprocedural states: Secondary | ICD-10-CM | POA: Insufficient documentation

## 2014-05-09 DIAGNOSIS — R0789 Other chest pain: Principal | ICD-10-CM | POA: Insufficient documentation

## 2014-05-09 DIAGNOSIS — I420 Dilated cardiomyopathy: Secondary | ICD-10-CM | POA: Diagnosis present

## 2014-05-09 DIAGNOSIS — Z792 Long term (current) use of antibiotics: Secondary | ICD-10-CM | POA: Insufficient documentation

## 2014-05-09 HISTORY — DX: Heart failure, unspecified: I50.9

## 2014-05-09 HISTORY — DX: Calculus of kidney: N20.0

## 2014-05-09 HISTORY — DX: Pure hypercholesterolemia, unspecified: E78.00

## 2014-05-09 LAB — BASIC METABOLIC PANEL
Anion gap: 7 (ref 5–15)
BUN: 18 mg/dL (ref 6–23)
CHLORIDE: 104 mmol/L (ref 96–112)
CO2: 26 mmol/L (ref 19–32)
Calcium: 9.6 mg/dL (ref 8.4–10.5)
Creatinine, Ser: 1.09 mg/dL (ref 0.50–1.35)
GFR calc Af Amer: 89 mL/min — ABNORMAL LOW (ref >90)
GFR calc non Af Amer: 77 mL/min — ABNORMAL LOW (ref >90)
Glucose, Bld: 106 mg/dL — ABNORMAL HIGH (ref 70–99)
Potassium: 3.6 mmol/L (ref 3.5–5.1)
SODIUM: 137 mmol/L (ref 135–145)

## 2014-05-09 LAB — CBC
HEMATOCRIT: 41.7 % (ref 39.0–52.0)
Hemoglobin: 14 g/dL (ref 13.0–17.0)
MCH: 28.1 pg (ref 26.0–34.0)
MCHC: 33.6 g/dL (ref 30.0–36.0)
MCV: 83.6 fL (ref 78.0–100.0)
Platelets: 328 10*3/uL (ref 150–400)
RBC: 4.99 MIL/uL (ref 4.22–5.81)
RDW: 14.5 % (ref 11.5–15.5)
WBC: 11.3 10*3/uL — ABNORMAL HIGH (ref 4.0–10.5)

## 2014-05-09 LAB — I-STAT TROPONIN, ED: Troponin i, poc: 0.01 ng/mL (ref 0.00–0.08)

## 2014-05-09 MED ORDER — GI COCKTAIL ~~LOC~~
30.0000 mL | Freq: Once | ORAL | Status: AC
  Administered 2014-05-09: 30 mL via ORAL
  Filled 2014-05-09: qty 30

## 2014-05-09 MED ORDER — MORPHINE SULFATE 4 MG/ML IJ SOLN
4.0000 mg | Freq: Once | INTRAMUSCULAR | Status: AC
  Administered 2014-05-09: 4 mg via INTRAVENOUS
  Filled 2014-05-09: qty 1

## 2014-05-09 MED ORDER — FAMOTIDINE IN NACL 20-0.9 MG/50ML-% IV SOLN
20.0000 mg | Freq: Once | INTRAVENOUS | Status: AC
  Administered 2014-05-09: 20 mg via INTRAVENOUS
  Filled 2014-05-09: qty 50

## 2014-05-09 NOTE — ED Notes (Signed)
Patient returned from X-ray 

## 2014-05-09 NOTE — ED Notes (Signed)
Family at bedside. 

## 2014-05-09 NOTE — ED Notes (Signed)
Pt. reports mid/left chest pain with SOB and nausea onset this morning unrelieved by sublingual NTG .

## 2014-05-09 NOTE — ED Provider Notes (Signed)
CSN: [id number]     Arrival date & time 05/09/14  2216 History   First MD Initiated Contact with Patient 05/09/14 2228     Chief Complaint  Patient presents with  . Chest Pain     HPI  Patient presents with chest pain. Patient was discharged from this facility 5 days ago, after an admission for chest pain. He notes that initially after discharge she was doing generally well, but over the past 2 days has developed lower anterior chest discomfort, radiating inwardly and superiorly. There is a tightness with mild dyspnea. There is occasional left arm radiation of the pain. There is no syncope, no vomiting, no fever. Patient has persistent mild cough. Patient has taken all medication as directed, states that nothing improves his pain, including sub-lingual nitroglycerin.   Past Medical History  Diagnosis Date  . CHF (congestive heart failure)   . Hypertension   . Chronic back pain     "neck down" (07/20/2013)  . GERD (gastroesophageal reflux disease)    Past Surgical History  Procedure Laterality Date  . Eye surgery Right   . Corneal transplant Right ~ 2010  . Cataract extraction w/ intraocular lens implant Right 02/16/2014  . Cardiac catheterization  2014?   No family history on file. History  Substance Use Topics  . Smoking status: Never Smoker   . Smokeless tobacco: Never Used  . Alcohol Use: No    Review of Systems  Constitutional:       Per HPI, otherwise negative  HENT:       Per HPI, otherwise negative  Respiratory:       Per HPI, otherwise negative  Cardiovascular:       Per HPI, otherwise negative  Gastrointestinal: Negative for vomiting.  Endocrine:       Negative aside from HPI  Genitourinary:       Neg aside from HPI   Musculoskeletal:       Per HPI, otherwise negative  Skin: Negative.   Neurological: Negative for syncope.      Allergies  Review of patient's allergies indicates no known allergies.  Home Medications   Prior to Admission  medications   Medication Sig Start Date End Date Taking? Authorizing Provider  aspirin EC 81 MG tablet Take 1 tablet (81 mg total) by mouth daily. Patient not taking: Reported on 03/19/2014 07/20/13   Ripudeep Jenna Luo, MD  carvedilol (COREG) 25 MG tablet Take 1 tablet (25 mg total) by mouth 2 (two) times daily with a meal. 05/04/14   Rich Number, MD  enalapril (VASOTEC) 20 MG tablet Take 1 tablet (20 mg total) by mouth 2 (two) times daily. 05/04/14   Rich Number, MD  furosemide (LASIX) 20 MG tablet Take 1 tablet (20 mg total) by mouth daily. 05/04/14   Rich Number, MD  lisinopril (PRINIVIL,ZESTRIL) 40 MG tablet Take 1 tablet (40 mg total) by mouth daily. 05/05/14   Rich Number, MD  lovastatin (MEVACOR) 20 MG tablet Take 1 tablet (20 mg total) by mouth at bedtime. 05/05/14   Rich Number, MD  nitroGLYCERIN (NITROSTAT) 0.4 MG SL tablet Place 1 tablet (0.4 mg total) under the tongue every 5 (five) minutes as needed for chest pain (CP or SOB). 05/04/14   Rich Number, MD  ofloxacin (OCUFLOX) 0.3 % ophthalmic solution Place 1 drop into the right eye 4 (four) times daily. 05/04/14   Rich Number, MD  oxyCODONE-acetaminophen (PERCOCET) 5-325 MG per tablet Take 1 tablet by mouth every 6 (six) hours  as needed. 03/19/14   Courtney A Forcucci, PA-C  pantoprazole (PROTONIX) 40 MG tablet Take 1 tablet (40 mg total) by mouth daily. 05/04/14   Rich Number, MD  prednisoLONE acetate (PRED FORTE) 1 % ophthalmic suspension Place 1 drop into the right eye 3 (three) times daily. 05/04/14   Rich Number, MD  ranitidine (ZANTAC) 150 MG tablet Take 1 tablet (150 mg total) by mouth at bedtime. 05/05/14   Rich Number, MD  simvastatin (ZOCOR) 10 MG tablet Take 1 tablet (10 mg total) by mouth at bedtime. 05/04/14   Carly Rivet, MD   BP 174/95 mmHg  Pulse 81  Temp(Src) 98.2 F (36.8 C)  Resp 18  SpO2 96% Physical Exam  Constitutional: He is oriented to person, place, and time. He appears well-developed. No distress.  HENT:  Head:  Normocephalic and atraumatic.  Eyes: Conjunctivae and EOM are normal.  Cardiovascular: Normal rate and regular rhythm.   Pulmonary/Chest: Effort normal. No stridor. No respiratory distress.  Tender to palpation with pressure across the anterior chest  Abdominal: He exhibits no distension.  Musculoskeletal: He exhibits no edema.  Neurological: He is alert and oriented to person, place, and time.  Skin: Skin is warm and dry.  Psychiatric: He has a normal mood and affect.  Nursing note and vitals reviewed.   ED Course  Procedures (including critical care time) Labs Review All labs reviewed, compared to recent evaluation.  Imaging Review I reviewed the x-ray agree with the interpretation.  EKG Interpretation   Date/Time:  Monday May 09 2014 22:18:20 EST Ventricular Rate:  74 PR Interval:  174 QRS Duration: 104 QT Interval:  380 QTC Calculation: 421 R Axis:   11 Text Interpretation:  Normal sinus rhythm T wave abnormality, consider  inferolateral ischemia Abnormal ECG Sinus rhythm T wave abnormality No  significant change since last tracing Abnormal ekg Confirmed by Gerhard Munch  MD [phone]) on 05/09/2014 10:26:17 PM     A review of the patient's chart, including discharge summary from earlier this week was conducted. During that evaluation the patient had echocardiogram, with evidence for dilated cardiomyopathy. Inpatient team also obtain old records, including evidence of prior abnormal stress test. Patient was discharged in stable condition with new medication 5 days ago.  MDM   Final diagnoses:  Chest pain  SOB (shortness of breath)    Patient presents with ongoing chest pain, dyspnea. Patient has known dilated cardiomyopathy, was admitted, discharged from this facility within the past week. Patient is awake and alert today, hemodynamically stable. With his ongoing chest pain he was admitted for further evaluation and management.   Gerhard Munch,  MD 05/09/14 [id number]

## 2014-05-10 ENCOUNTER — Encounter (HOSPITAL_COMMUNITY): Payer: Self-pay | Admitting: General Practice

## 2014-05-10 ENCOUNTER — Telehealth: Payer: Self-pay | Admitting: Internal Medicine

## 2014-05-10 DIAGNOSIS — R0789 Other chest pain: Secondary | ICD-10-CM

## 2014-05-10 DIAGNOSIS — D72829 Elevated white blood cell count, unspecified: Secondary | ICD-10-CM

## 2014-05-10 DIAGNOSIS — R785 Finding of other psychotropic drug in blood: Secondary | ICD-10-CM

## 2014-05-10 DIAGNOSIS — Z947 Corneal transplant status: Secondary | ICD-10-CM

## 2014-05-10 DIAGNOSIS — K219 Gastro-esophageal reflux disease without esophagitis: Secondary | ICD-10-CM

## 2014-05-10 DIAGNOSIS — I5042 Chronic combined systolic (congestive) and diastolic (congestive) heart failure: Secondary | ICD-10-CM

## 2014-05-10 DIAGNOSIS — I10 Essential (primary) hypertension: Secondary | ICD-10-CM

## 2014-05-10 DIAGNOSIS — H538 Other visual disturbances: Secondary | ICD-10-CM

## 2014-05-10 DIAGNOSIS — Z7982 Long term (current) use of aspirin: Secondary | ICD-10-CM

## 2014-05-10 DIAGNOSIS — R079 Chest pain, unspecified: Secondary | ICD-10-CM

## 2014-05-10 DIAGNOSIS — E02 Subclinical iodine-deficiency hypothyroidism: Secondary | ICD-10-CM

## 2014-05-10 DIAGNOSIS — H5711 Ocular pain, right eye: Secondary | ICD-10-CM

## 2014-05-10 LAB — TROPONIN I
TROPONIN I: 0.05 ng/mL — AB (ref <0.031)
Troponin I: <0.03 ng/mL (ref <0.031)
Troponin I: <0.03 ng/mL (ref <0.031)

## 2014-05-10 LAB — CBC
HCT: 40.5 % (ref 39.0–52.0)
HEMOGLOBIN: 13.3 g/dL (ref 13.0–17.0)
MCH: 27.7 pg (ref 26.0–34.0)
MCHC: 32.8 g/dL (ref 30.0–36.0)
MCV: 84.4 fL (ref 78.0–100.0)
PLATELETS: 309 10*3/uL (ref 150–400)
RBC: 4.8 MIL/uL (ref 4.22–5.81)
RDW: 14.8 % (ref 11.5–15.5)
WBC: 10.4 10*3/uL (ref 4.0–10.5)

## 2014-05-10 LAB — BRAIN NATRIURETIC PEPTIDE: B Natriuretic Peptide: 139.1 pg/mL — ABNORMAL HIGH (ref 0.0–100.0)

## 2014-05-10 MED ORDER — HEPARIN SODIUM (PORCINE) 5000 UNIT/ML IJ SOLN
5000.0000 [IU] | Freq: Three times a day (TID) | INTRAMUSCULAR | Status: DC
  Administered 2014-05-10: 5000 [IU] via SUBCUTANEOUS
  Filled 2014-05-10: qty 1

## 2014-05-10 MED ORDER — ACETAMINOPHEN 325 MG PO TABS: 650.0000 mg | ORAL_TABLET | ORAL | Status: DC | PRN

## 2014-05-10 MED ORDER — OFLOXACIN 0.3 % OP SOLN
1.0000 [drp] | Freq: Four times a day (QID) | OPHTHALMIC | Status: DC
  Administered 2014-05-10 (×2): 1 [drp] via OPHTHALMIC
  Filled 2014-05-10: qty 5

## 2014-05-10 MED ORDER — FUROSEMIDE 20 MG PO TABS
20.0000 mg | ORAL_TABLET | Freq: Every day | ORAL | Status: DC
  Administered 2014-05-10: 20 mg via ORAL
  Filled 2014-05-10: qty 1

## 2014-05-10 MED ORDER — CARVEDILOL 25 MG PO TABS
25.0000 mg | ORAL_TABLET | Freq: Two times a day (BID) | ORAL | Status: DC
  Administered 2014-05-10: 25 mg via ORAL
  Filled 2014-05-10: qty 1

## 2014-05-10 MED ORDER — PREDNISOLONE ACETATE 1 % OP SUSP
1.0000 [drp] | Freq: Three times a day (TID) | OPHTHALMIC | Status: DC
  Administered 2014-05-10 (×2): 1 [drp] via OPHTHALMIC
  Filled 2014-05-10: qty 1

## 2014-05-10 MED ORDER — MORPHINE SULFATE 2 MG/ML IJ SOLN: 1.0000 mg | INTRAMUSCULAR | Status: DC | PRN

## 2014-05-10 MED ORDER — CYCLOBENZAPRINE HCL 7.5 MG PO TABS: 7.5000 mg | ORAL_TABLET | Freq: Three times a day (TID) | ORAL | Status: DC

## 2014-05-10 MED ORDER — LIVING BETTER WITH HEART FAILURE BOOK - IN SPANISH: Freq: Once | Status: DC

## 2014-05-10 MED ORDER — PANTOPRAZOLE SODIUM 40 MG PO TBEC
40.0000 mg | DELAYED_RELEASE_TABLET | Freq: Every day | ORAL | Status: DC
Start: 2014-05-10 — End: 2014-05-10
  Administered 2014-05-10: 40 mg via ORAL
  Filled 2014-05-10: qty 1

## 2014-05-10 MED ORDER — LISINOPRIL 40 MG PO TABS
40.0000 mg | ORAL_TABLET | Freq: Every day | ORAL | Status: DC
Start: 2014-05-10 — End: 2014-05-10
  Administered 2014-05-10: 40 mg via ORAL
  Filled 2014-05-10: qty 1

## 2014-05-10 MED ORDER — ONDANSETRON HCL 4 MG/2ML IJ SOLN: 4.0000 mg | Freq: Three times a day (TID) | INTRAMUSCULAR | Status: DC | PRN

## 2014-05-10 MED ORDER — SIMVASTATIN 10 MG PO TABS
10.0000 mg | ORAL_TABLET | Freq: Every day | ORAL | Status: DC
  Administered 2014-05-10: 10 mg via ORAL
  Filled 2014-05-10: qty 1

## 2014-05-10 MED ORDER — ASPIRIN EC 81 MG PO TBEC
81.0000 mg | DELAYED_RELEASE_TABLET | Freq: Every day | ORAL | Status: DC
Start: 2014-05-10 — End: 2014-05-10
  Administered 2014-05-10: 81 mg via ORAL
  Filled 2014-05-10: qty 1

## 2014-05-10 MED ORDER — HYDROMORPHONE HCL 1 MG/ML IJ SOLN
1.0000 mg | INTRAMUSCULAR | Status: AC | PRN
  Administered 2014-05-10: 1 mg via INTRAVENOUS
  Filled 2014-05-10: qty 1

## 2014-05-10 MED ORDER — NITROGLYCERIN 0.4 MG SL SUBL: 0.4000 mg | SUBLINGUAL_TABLET | SUBLINGUAL | Status: DC | PRN

## 2014-05-10 MED ORDER — CYCLOBENZAPRINE HCL 5 MG PO TABS
7.5000 mg | ORAL_TABLET | Freq: Three times a day (TID) | ORAL | Status: DC
  Administered 2014-05-10: 7.5 mg via ORAL
  Filled 2014-05-10 (×3): qty 1.5

## 2014-05-10 MED ORDER — ONDANSETRON HCL 4 MG/2ML IJ SOLN: 4.0000 mg | Freq: Four times a day (QID) | INTRAMUSCULAR | Status: DC | PRN

## 2014-05-10 NOTE — Progress Notes (Signed)
Heart Failure Navigator Consult Note  Presentation: Phillip [name] is a 49 yo spanish-speaking man from [place] with hx of HTN, GERD, corneal tx, combined CHF EF 30-35%, recent admission for chest pain 05/02/14 to 05/04/14, comes back for chest pain.  Chest pain started on 2/13, had mild cp that day and also the next day. Then today in the morning his pain increased. It's located on the left side of his chest, under his left breast. It hurts to move around, pain increases with movement. Took nitro without help 2x in the morning. GI cocktail in ED helped with nausea, but didn't change the pain. Having some dry cough for 1 month about without sputum production. No injury to the chest. Has cleaned some snow but that was after the pain already started per patient. Has had some spicy food too but pain preceded that per patient. No alcohol use currently, not a smoker. Denies any volume changes or swelling.   Had 2014 abnormal stress PET follows by normal cardiac cath. Last admission had diffuse TWI which were determined to be old. cardiology did not believe his CP to be ischemic last time. Had 6 total troponin, only once 0.19, thought to be erroneous. Cards believe his CP was related to his CHF and recommended no further workup. His CP resolved before discharge.   Last discharge with 102.604kg. No weight from today Past Medical History  Diagnosis Date  . CHF (congestive heart failure)   . Hypertension   . Chronic back pain     "neck down" (07/20/2013)  . GERD (gastroesophageal reflux disease)   . High cholesterol   . Cardiac insufficiency   . Renal calculus     History   Social History  . Marital Status: Married    Spouse Name: N/A  . Number of Children: N/A  . Years of Education: N/A   Social History Main Topics  . Smoking status: Never Smoker   . Smokeless tobacco: Never Used  . Alcohol Use: No  . Drug Use: No  . Sexual Activity: Yes   Other Topics Concern  . None   Social  History Narrative    ECHO:Study Conclusions--05/03/14  - Left ventricle: The cavity size was moderately dilated. Wall thickness was normal. Systolic function was moderately to severely reduced. The estimated ejection fraction was in the range of 30% to 35%. Diffuse hypokinesis. There is akinesis of the basal-midinferior and inferoseptal myocardium. Acoustic shadowing and artifact obscure the LV apex - no obvious mural thrombus, but not well visualized. Microbubble contrast study could be considered for further evaluation. Doppler parameters are consistent with restrictive physiology, indicative of decreased left ventricular diastolic compliance and/or increased left atrial pressure. - Mitral valve: Mildly thickened leaflets . There was mild regurgitation. - Left atrium: The atrium was at the upper limits of normal in size. - Right ventricle: Systolic function was normal. - Right atrium: Central venous pressure (est): 3 mm Hg. - Atrial septum: No defect or patent foramen ovale was identified. - Tricuspid valve: There was trivial regurgitation. - Pulmonary arteries: PA peak pressure: 13 mm Hg (S). - Pericardium, extracardiac: There was no pericardial effusion.  Impressions:  - Unable to compare directly with the prior study April 2015. Normal LV wall thickness with moderate chamber dilatation and LVEF 30-35%. Diffuse hypokinesis with akinesis of the mid to basal inferoseptal and inferior walls. Acoustic shadowing and artifact obscure the LV apex - no obvious mural thrombus, but not well visualized. Microbubble contrast study could be considered  for further evaluation. Ungraded restrictive filling pattern. Upper normal left atrial size. Mild mitral regurgitation. Trivial tricuspid regurgitation with normal estimated PASP.  Transthoracic echocardiography. M-mode, complete 2D, spectral Doppler, and color Doppler. Birthdate: Patient  birthdate: 08-Nov-1965. Age: Patient is 49 yr old. Sex: Gender: male. BMI: 33.4 kg/m^2. Blood pressure:   145/80 Patient status: Inpatient. Study date: Study date: 05/03/2014. Study time: 10:20 AM. Location: Bedside.  BNP    Component Value Date/Time   BNP 139.1* 05/09/2014 2228    ProBNP    Component Value Date/Time   PROBNP 83.8 07/19/2013 2155     Education Assessment and Provision:  Detailed education and instructions provided on heart failure disease management including the following:  Signs and symptoms of Heart Failure When to call the physician Importance of daily weights Low sodium diet Fluid restriction Medication management Anticipated future follow-up appointments  Patient education given on each of the above topics.  Patient acknowledges understanding and acceptance of all instructions.  I spoke to the patient with his Nurse Irving Burton (fluent in spanish) translating.  He was just released from the hospital on 05/04/14.  And came back on 05/09/14.  He admits that he a hard time affording medications and had not been taking all prescribed at previous discharge.  He also admits that he does not have a scale and has not been weighing daily--nor had anyone told him to do so.  I will provide him with a scale, have reviewed the importance of daily weights and when to call the doctor.  He also describes a diet high in sodium--saying that "everything they eat" has "chicken bouillon powder" added to it for flavoring.  I reviewed a low sodium diet and helped him understand why it is important.  He works nightshift and had questions related to when to take medications and when to weigh.  I reinforced that time is not so important as is the fact that he weighs and takes medications consistently.   Education Materials:  "Living Better With Heart Failure" Booklet in Spanish, Daily Weight Tracker Tool in spanish   High Risk Criteria for Readmission and/or Poor Patient  Outcomes:  (Recommend Follow-up with [facility])--Yes --he needs close follow up and possibly needs medication assistance to help remain compliant with meds.   EF <30%- No-30-35%  2 or more admissions in 6 months- Yes--2 in 2 weeks  Difficult social situation- No  Demonstrates medication noncompliance- Yes due to finances   Barriers of Care:  Knowledge, compliance , finances  Discharge Planning:   Plans to return home to wife and child.  He has a follow-up transition appt scheduled in the [facility] clinic on 05/17/14 at 4pm.

## 2014-05-10 NOTE — H&P (Signed)
Date: 05/10/2014               Patient Name:  Phillip [name] MRN: [id number]  DOB: 1966-02-29 Age / Sex: 49 y.o., male   PCP: Pcp Not In System         Medical Service: Internal Medicine Teaching Service         Attending Physician: Dr. Burns Spain, MD    First Contact: Dr. Beckie Salts Pager: [phone]  Second Contact: Dr. 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  Chest pain started on 2/13, had mild cp that day and also the next day. Then today in the morning his pain increased. It's located on the left side of his chest, under his left breast. It hurts to move around, pain increases with movement. Took nitro without help 2x in the morning. GI cocktail in ED helped with nausea, but didn't change the pain. Having some dry cough for 1 month about without sputum production. No injury to the chest. Has cleaned some snow but that was after the pain already started per patient. Has had some spicy food too but pain preceded that per patient. No alcohol use currently, not a smoker. Denies any volume changes or swelling.   Had 2014 abnormal stress PET follows by normal cardiac cath. Last admisison had diffuse TWI which were determined to be old. cardiology did not believe his CP to be ischemic last time. Had 6 total troponin, only once 0.19, thought to be erroneous. Cards believe his CP was related to his CHF and recommended no further workup. His CP resolved before discharge. He was sent home with asa  daily, coreg  BID, enalapril  bid, lasix  daily, and simvastatin  daily. as been taking all of his meds that he was discharged on, except  statin. Had some trouble getting the meds at the pharmacy when they went there.  Last discharge with 102.604kg. No weight from today.   Meds: Current Facility-Administered Medications  Medication Dose Route Frequency Provider Last Rate Last Dose  . famotidine (PEPCID) IVPB 20 mg  20 mg Intravenous Once Gerhard Munch, MD 100 mL/hr at 05/09/14 2353 20 mg at 05/09/14 2353   Current Outpatient Prescriptions  Medication Sig Dispense Refill  . carvedilol (COREG) 25 MG tablet Take 1 tablet (25 mg total) by mouth 2 (two) times daily with a meal. 60 tablet 3  . furosemide (LASIX) 20 MG tablet Take 1 tablet (20 mg total) by mouth daily. 30 tablet 3  . lisinopril (PRINIVIL,ZESTRIL) 40 MG tablet Take 1 tablet (40 mg total) by mouth daily. 30 tablet 3  . lovastatin (MEVACOR) 20 MG tablet Take 1 tablet (20 mg total) by mouth at bedtime. 30 tablet 3  . nitroGLYCERIN (NITROSTAT) 0.4 MG SL tablet Place 1 tablet (0.4 mg total) under the tongue every 5 (five) minutes as needed for chest pain (CP or SOB). 30 tablet 3  . ofloxacin (OCUFLOX) 0.3 % ophthalmic solution Place 1 drop into the right eye 4 (four) times daily. 5 mL 0  . prednisoLONE acetate (PRED FORTE) 1 % ophthalmic suspension Place 1 drop into the right eye  3 (three) times daily. 5 mL 0  . ranitidine (ZANTAC) 150 MG tablet Take 1 tablet (150 mg total) by mouth at bedtime. 30 tablet 3  . aspirin EC 81 MG tablet Take 1 tablet (81 mg total) by mouth daily. (Patient not taking: Reported on 03/19/2014) 30 tablet 3  . enalapril (VASOTEC) 20 MG tablet Take 1 tablet (20 mg total) by mouth 2 (two) times daily. (Patient not taking: Reported on 05/09/2014) 60 tablet 3  . oxyCODONE-acetaminophen (PERCOCET) 5-325 MG per tablet Take 1 tablet by mouth every 6 (six) hours as needed. (Patient not taking: Reported on 05/09/2014) 10 tablet 0  . pantoprazole (PROTONIX) 40 MG tablet Take 1 tablet (40 mg total) by mouth daily. (Patient not taking: Reported on 05/09/2014) 30  tablet 3  . simvastatin (ZOCOR) 10 MG tablet Take 1 tablet (10 mg total) by mouth at bedtime. (Patient not taking: Reported on 05/09/2014) 30 tablet 3    Allergies: Allergies as of 05/09/2014  . (No Known Allergies)   Past Medical History  Diagnosis Date  . CHF (congestive heart failure)   . Hypertension   . Chronic back pain     "neck down" (07/20/2013)  . GERD (gastroesophageal reflux disease)    Past Surgical History  Procedure Laterality Date  . Eye surgery Right   . Corneal transplant Right ~ 2010  . Cataract extraction w/ intraocular lens implant Right 02/16/2014  . Cardiac catheterization  2014?   No family history on file. History   Social History  . Marital Status: Married    Spouse Name: N/A  . Number of Children: N/A  . Years of Education: N/A   Occupational History  . Not on file.   Social History Main Topics  . Smoking status: Never Smoker   . Smokeless tobacco: Never Used  . Alcohol Use: No  . Drug Use: No  . Sexual Activity: Yes   Other Topics Concern  . Not on file   Social History Narrative    Review of Systems: Review of Systems  Constitutional: Negative for fever, chills and diaphoresis.  HENT: Negative for sore throat.   Eyes: Negative for blurred vision, double vision, discharge and redness.  Respiratory: Positive for cough and shortness of breath. Negative for hemoptysis, sputum production and wheezing.   Cardiovascular: Positive for chest pain. Negative for palpitations, orthopnea, claudication and leg swelling.  Gastrointestinal: Positive for nausea. Negative for heartburn, vomiting, abdominal pain, diarrhea, blood in stool and melena.  Genitourinary: Negative for dysuria, urgency, hematuria and flank pain.  Musculoskeletal: Negative for myalgias, back pain and neck pain.  Skin: Negative.  Negative for itching and rash.  Neurological: Negative for dizziness, tingling, sensory change, focal weakness, seizures, loss of consciousness,  weakness and headaches.  Endo/Heme/Allergies: Negative.  Negative for environmental allergies. Does not bruise/bleed easily.  Psychiatric/Behavioral: Negative for depression.    Physical Exam: Blood pressure 130/82, pulse 64, temperature 98.2 F (36.8 C), resp. rate 14, SpO2 99 %.  Physical Exam  Constitutional: He is oriented to person, place, and time. He appears well-developed and well-nourished. No distress.  HENT:  Head: Normocephalic and atraumatic.  Right Ear: External ear normal.  Left Ear: External ear normal.  Mouth/Throat: No oropharyngeal exudate.  Eyes: Conjunctivae and EOM are normal. Pupils are equal, round, and reactive to light.  Neck: Normal range of motion. Neck supple. No JVD present. No tracheal deviation present.  Cardiovascular: Normal rate, regular rhythm, normal heart sounds and intact distal pulses.  Exam reveals  no gallop and no friction rub.   No murmur heard. Respiratory: Effort normal and breath sounds normal. No stridor. No respiratory distress. He has no wheezes. He has no rales. He exhibits tenderness.  Has left sided TTP under his left breast. Pain was felt more when he was moving around his left arm or coughing.   No rashes.  GI: Soft. Bowel sounds are normal. He exhibits no distension and no mass. There is tenderness. There is no rebound and no guarding.  Has some epigastric TTP.   Musculoskeletal: Normal range of motion. He exhibits no edema or tenderness.  Neurological: He is alert and oriented to person, place, and time. He has normal reflexes. No cranial nerve deficit. Coordination normal.  Skin: He is not diaphoretic.    Lab results: Basic Metabolic Panel:  Recent Labs  40/98/11 2228  NA 137  K 3.6  CL 104  CO2 26  GLUCOSE 106*  BUN 18  CREATININE 1.09  CALCIUM 9.6   Liver Function Tests: No results for input(s): AST, ALT, ALKPHOS, BILITOT, PROT, ALBUMIN in the last 72 hours. No results for input(s): LIPASE, AMYLASE in the last  72 hours. No results for input(s): AMMONIA in the last 72 hours. CBC:  Recent Labs  05/09/14 2228  WBC 11.3*  HGB 14.0  HCT 41.7  MCV 83.6  PLT 328   Cardiac Enzymes: No results for input(s): CKTOTAL, CKMB, CKMBINDEX, TROPONINI in the last 72 hours. BNP: No results for input(s): PROBNP in the last 72 hours. D-Dimer: No results for input(s): DDIMER in the last 72 hours. CBG: No results for input(s): GLUCAP in the last 72 hours. Hemoglobin A1C: No results for input(s): HGBA1C in the last 72 hours. Fasting Lipid Panel: No results for input(s): CHOL, HDL, LDLCALC, TRIG, CHOLHDL, LDLDIRECT in the last 72 hours. Thyroid Function Tests: No results for input(s): TSH, T4TOTAL, FREET4, T3FREE, THYROIDAB in the last 72 hours. Anemia Panel: No results for input(s): VITAMINB12, FOLATE, FERRITIN, TIBC, IRON, RETICCTPCT in the last 72 hours. Coagulation: No results for input(s): LABPROT, INR in the last 72 hours. Urine Drug Screen: Drugs of Abuse     Component Value Date/Time   LABOPIA NONE DETECTED 07/25/2013 0220   COCAINSCRNUR NONE DETECTED 07/25/2013 0220   LABBENZ NONE DETECTED 07/25/2013 0220   AMPHETMU NONE DETECTED 07/25/2013 0220   THCU NONE DETECTED 07/25/2013 0220   LABBARB NONE DETECTED 07/25/2013 0220    Alcohol Level: No results for input(s): ETH in the last 72 hours. Urinalysis: No results for input(s): COLORURINE, LABSPEC, PHURINE, GLUCOSEU, HGBUR, BILIRUBINUR, KETONESUR, PROTEINUR, UROBILINOGEN, NITRITE, LEUKOCYTESUR in the last 72 hours.  Invalid input(s): APPERANCEUR Misc. Labs:  Imaging results:  Dg Chest 2 View  05/09/2014   CLINICAL DATA:  Left chest pain extra side left chest and arm pain tonight. Shortness of breath.  EXAM: CHEST  2 VIEW  COMPARISON:  Single view of the chest 05/02/2014. PA and lateral chest 03/19/2015.  FINDINGS: Lung volumes somewhat low with crowding of the bronchovascular structures. No consolidative process, pneumothorax or effusion  is identified. No focal bony abnormality.  IMPRESSION: No acute finding in a low volume chest.   Electronically Signed   By: Drusilla Kanner M.D.   On: 05/09/2014 23:00    Other results: EKG: TWI's diffusely, unchanged from previous.   Assessment & Plan by Problem: Active Problems:   Chest pain  49 yo male with recent admission for chest pain thought to be from CHF here with recurrent CP.  Chest pain - likely MSK pain as has TTP on the left chest wall. Could also be from GERD.  CXR negative for fx, no rashes seen on exam.   had diffuse TWI last admission, trops were all negative except one value of 0.19, which was thought to be errorenous. Cards were consulted, they did not think his pain was anginal. They thought it was from CHF/volume overload.  EKG looks same as prior. He doesn't seem volume overloaded on exam. BNP is mildy lelevated at 130.  Abnormal stress PET scan 2014, normal coronaries on cath 2014.   - will trend trops, repeat EKG, consult cards in AM about plans, NPO for now - will try to treat for MSK pain with flexiril, morphinr PRN, heat pack, tylenol.   GERD - nausea/belching improved with GI cocktail, may have GERD, on ranitidine now, will treat with protonix. Likely will need this on discharge as new med.  Combined CHF - with dilated cardiomyopathy- [facility] 05/03/14 EF 30-35%, diffuse hypokinesis, mod dilated LV, restrictive filling pattern.  - cont home med: asa  daily, coreg  BID, enalapril  BID, lasix  daily, simvastatin  daily. - could he have Chagas disease? This can cause dilated cardiomyopathy and also retrosternal CP/GERd symptoms. Other factor could be alcohol use, al though denies any currently.  -will check for HIV  HTN - normal now.  - cont home meds are coreg  BID, lasix  daily, enalapril  BID.   HLD - recently started on simvastatin - cont.   Subclinical hypothyroidism last admission, TSH 5.722, T4 low normal 0.84.  - Plan  to repeat in 3 months.  R eye pain/blurry vision - has appt on 05/12/14 with Dr. Earlene Plater at [facility] - Patient has a history of corneal transplant in Nov 2014 by Dr. Earlene Plater in [place]. There was concern for possible corneal transplant rejection and thus ophthalmology was consulted last admission Ophtho recommended for him to start on Ofloxacin eye drops and Prednisolone eye drops, and to follow up with Dr. Earlene Plater. - cont eye drops.  Leukocytosis - unclear etiology. Monitor for any signs of infection.  NPO for now. Heparin SQ for   Dispo: Disposition is deferred at this time, awaiting improvement of current medical problems. Anticipated discharge in approximately 1-2 day(s).   The patient does have a current PCP (Pcp Not In System) and does need an [facility] hospital follow-up appointment after discharge.  The patient does have transportation limitations that hinder transportation to clinic appointments.  Signed: Hyacinth Meeker, MD 05/10/2014, 12:13 AM

## 2014-05-10 NOTE — Discharge Instructions (Signed)
Ophthalmology (los ojos): 05/12/2014 at 8:15AM Dr. Lebron Conners Internal Medicine: 05/13/2014 at 9:15 [facility] sotano) Dr. Burtis Junes Meeting with Chauncey Reading (insurance card) to follow on 05/13/2014 Cardiology 05/17/2014 at 4:00PM Dr. Gala Romney  Plan de alimentacin con bajo contenido de sodio (Low-Sodium Eating Plan) El sodio aumenta la presin arterial y hace que el cuerpo retenga lquidos. El consumo de alimentos con menos sodio ayuda a Conservator, museum/gallery presin arterial, a Building services engineer y a Physicist, medical, el hgado y los riones. El hecho de agregar sal (cloruro de sodio) a los alimentos aumenta el aporte de [place]. La mayor parte del sodio proviene de los alimentos enlatados, envasados y congelados. La pizza, la comida rpida y la comida de los restaurantes tambin contienen mucho sodio. Aunque usted tome medicamentos para bajar la presin arterial o reducir el lquido del cuerpo, es importante que disminuya el aporte de sodio de los alimentos. EN QU CONSISTE EL PLAN? La [employer] de las personas deberan limitar la ingesta de sodio a 2300mg  por Futures trader. El mdico le recomienda que limite su consumo de sodio a __________ [employer].  QU DEBO SABER ACERCA DE ESTE PLAN DE ALIMENTACIN? Para el plan de alimentacin con bajo contenido de sodio, debe seguir estas pautas generales:  Elija alimentos con un valor porcentual diario de sodio de menos del 5% (segn se indica en la etiqueta).  Use hierbas o aderezos sin sal, en lugar de sal de mesa o sal marina.  Consulte al mdico o farmacutico antes de usar sustitutos de la sal.  Coma alimentos frescos.  Coma ms frutas y verduras.  Limite las verduras enlatadas. Si las consume, enjuguelas bien para disminuir el sodio.  Limite el consumo de queso a 1oz (28g) por Futures trader.  Coma productos con bajo contenido de sodio, cuya etiqueta suele decir "bajo contenido de sodio" o "sin agregado de sal".  Evite alimentos que contengan glutamato monosdico (MSG),  que a veces se agrega a la comida [place] y a algunos alimentos enlatados.  Consulte las etiquetas de los alimentos (etiquetas de informacin nutricional) para saber cunto sodio contiene una porcin.  Consuma ms alimentos caseros y Lowe[employer] Companies comida rpida, de bufs o de restaurantes.  Cuando coma en un restaurante, pida que le preparen la comida con menos sal o, si es posible, sin nada de sal. CMO LEO LA INFORMACIN SOBRE EL SODIO EN LAS ETIQUETAS DE LOS ALIMENTOS? La etiqueta de informacin nutricional indica la cantidad de sodio en una porcin de alimento. Si come ms de una porcin, debe multiplicar la cantidad indicada de sodio por la cantidad de porciones. Las etiquetas de los alimentos tambin pueden indicar lo siguiente:  Sin sodio: menos de 5mg  por porcin.  Cantidad muy baja de sodio: 35mg  o menos por porcin.  Cantidad baja de sodio: 140mg  o menos por porcin.  Menor cantidad de sodio: 50% menos de sodio en una porcin. Por ejemplo, si un alimento que por lo general contiene 300mg  de sodio se modifica para incluir una menor cantidad de sodio, tendr 150mg  de sodio.  Sodio reducido: 25% menos de Industrial/product designer. Por ejemplo, si un alimento que por lo general contiene 400mg  de sodio se modifica para convertirse en un alimento de sodio reducido, tendr 300mg  de sodio. QU ALIMENTOS PUEDO COMER? Cereales Cereales con bajo contenido de sodio, como [place], arroz y trigo [place], y trigo triturado. Galletas con bajo contenido de [place]. Arroz y pastas sin sal. Pan con bajo contenido de [place].  Vegetales Verduras frescas  o congeladas. Verduras enlatadas con bajo contenido de sodio o reducido de sodio. Pasta y salsa de tomate con contenido bajo o reducido de sodio. Jugos de tomate y verduras con contenido bajo o reducido de sodio.  Nils Pyle Frutas frescas, congeladas y Primary school teacher. Jugo de frutas.  Carnes y otras fuentes de protenas Atn y salmn enlatado con bajo contenido de  [place]. Carne de vaca o ave, pescado y frutos de mar frescos o congelados. Cordero. Frutos secos sin sal. Lentejas, frijoles y guisantes secos, sin sal agregada. Frijoles enlatados sin sal. Sopas caseras sin sal. Huevos.  Lcteos Leche. Leche de soja. Queso ricota. Quesos con contenido bajo o reducido de sodio. Yogur.  Condimentos Hierbas y especias frescas y secas. Aderezos sin sal. Cebolla y ajo en polvo. Variedades de mostaza y ketchup con bajo contenido de [place]. Jugo de limn.  Grasas y aceites Aderezos para ensalada con contenido reducido de [place]. Mantequilla sin sal.  Otros Palomitas de maz y pretzels sin sal.  Esta no es una lista completa de los alimentos o las bebidas recomendados. Consulte a su nutricionista para conocer ms opciones. QU ALIMENTOS NO ESTN RECOMENDADOS? Cereales Cereales instantneos para comer caliente. Mezclas para bizcochos, panqueques y rellenos de pan. Crutones. Mezclas para pastas o arroz con condimento. Envases comerciales de sopa de fideos. Macarrones con queso envasados o congelados. Harina leudante. Galletas saladas comunes. Vegetales Verduras enlatadas comunes. Pasta y salsa de tomate en lata comunes. Jugos comunes de tomate y de verduras. Verduras Hydrologist. Papas fritas saladas. Aceitunas. Rosita Fire. Salsas. Chucrut. Salsa. Carnes y [address] fuentes de protenas Carne de [place], pescado o frutos de mar que est [place], [place], [place], condimentada con especias o con pickles. Panceta, jamn, salchichas, hot dogs, carne curada, carne picada (carne envasada de buey) y embutidos. Cerdo salado. Cecina o charqui. Arenque en escabeche. Anchoas, atn enlatado comn y sardinas. Frutos secos con sal. Celine Mans para untar y quesos procesados. Requesn. Queso azul y cottage. Suero de [place].  Condimentos Sal de cebolla y ajo, sal condimentada, sal de mesa y sal marina. Salsas en lata y envasadas. Salsa Worcestershire. Salsa trtara. Salsa barbacoa. Salsa  teriyaki. Salsa de soja, incluso la que tiene contenido reducido de [place]. Salsa de carne. Salsa de pescado. Salsa de [place]. Salsa rosada. Rbanos picantes. Ketchup y mostaza comunes. Saborizantes y tiernizantes para carne. Caldo en cubitos. Salsa picante. Salsa tabasco. Adobos. Aderezos para tacos. Salsas. Grasas y aceites Aderezos comunes para ensalada. Mantequilla con sal. Margarina. Mantequilla clarificada. Grasa de panceta.  Otros Nachos y papas fritas envasadas. Maz inflado y frituras de maz. Palomitas de maz y pretzels con sal. Sopas enlatadas o en polvo. Pizza. Pasteles y entradas congeladas.  Esta no es [employer] de los alimentos y las bebidas que Personnel officer. Consulte a su nutricionista para obtener ms informacin. Document Released: 03/11/2005 Document Revised: 03/16/2013 [facility] Patient Information 2015 [place], [place]. This information is not intended to replace advice given to you by your health care provider. Make sure you discuss any questions you have with your health care provider.

## 2014-05-10 NOTE — Discharge Summary (Signed)
Name: Phillip [name] MRN: [id number] DOB: 1966/03/21 49 y.o. PCP: Pcp Not In System  Date of Admission: 05/09/2014 10:21 PM Date of Discharge: 05/10/2014 Attending Physician: Aletta Edouard, MD  Discharge Diagnosis: Chest Pain, musculoskeletal  Other Chronic Problems:  HTN Congestive dilated cardiomyopathy Hyperlipidemia GERD  Discharge Medications:   Medication List    STOP taking these medications        lisinopril 40 MG tablet  Commonly known as:  PRINIVIL,ZESTRIL     simvastatin 10 MG tablet  Commonly known as:  ZOCOR      TAKE these medications        aspirin EC 81 MG tablet  Take 1 tablet (81 mg total) by mouth daily.     carvedilol 25 MG tablet  Commonly known as:  COREG  Take 1 tablet (25 mg total) by mouth 2 (two) times daily with a meal.     cyclobenzaprine 7.5 MG tablet  Commonly known as:  FEXMID  Take 1 tablet (7.5 mg total) by mouth 3 (three) times daily.     enalapril 20 MG tablet  Commonly known as:  VASOTEC  Take 1 tablet (20 mg total) by mouth 2 (two) times daily.     furosemide 20 MG tablet  Commonly known as:  LASIX  Take 1 tablet (20 mg total) by mouth daily.     lovastatin 20 MG tablet  Commonly known as:  MEVACOR  Take 1 tablet (20 mg total) by mouth at bedtime.     nitroGLYCERIN 0.4 MG SL tablet  Commonly known as:  NITROSTAT  Place 1 tablet (0.4 mg total) under the tongue every 5 (five) minutes as needed for chest pain (CP or SOB).     ofloxacin 0.3 % ophthalmic solution  Commonly known as:  OCUFLOX  Place 1 drop into the right eye 4 (four) times daily.     oxyCODONE-acetaminophen 5-325 MG per tablet  Commonly known as:  PERCOCET  Take 1 tablet by mouth every 6 (six) hours as needed.     pantoprazole 40 MG tablet  Commonly known as:  PROTONIX  Take 1 tablet (40 mg total) by mouth daily.     prednisoLONE acetate 1 % ophthalmic suspension  Commonly known as:  PRED FORTE  Place 1 drop into the right eye 3  (three) times daily.     ranitidine 150 MG tablet  Commonly known as:  ZANTAC  Take 1 tablet (150 mg total) by mouth at bedtime.        Disposition and follow-up:   [name] was discharged from [facility] in Stable condition.  At the hospital follow up visit please address:  1.  Chest Pain: This was reason for admission, but it was found to be musculoskeletal: treating with flexiril, heat packs and tylenol. Needs better access to medications (has appointment with Chauncey Reading after IMTS clinic appointment on 2/19).  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 Gala Romney (please remind him of his appointment on 2/23 at [facility]). Not typical presentation for T. Cruzi (no arrhythmias); could consider antibiody testing for T. Cruzi if it is determined that he has not been tested in past. Could consider HIV as outpatient. Needs better access to medications (has appointment with Chauncey Reading after IMTS clinic appointment on 2/19).  Subclinical Hypothyroidism: Needs repeat labs in 3 months.  History of Corneal Transplant: Patient was to have seen Dr. Earlene Plater [facility] ophtho) on 2/18 at 8:15AM. Please ask if he  attended that appointment.  2.  Labs / imaging needed at time of follow-up: HIV, could consider T. Cruzi antibody  3.  Pending labs/ test needing follow-up: none  Follow-up Appointments:     Follow-up Information    Follow up with Christen Bame, MD On 05/13/2014.   Specialty:  Internal Medicine   Why:  9:15. Deb Hill (orange card) also aware of patient. He should meet with her after his appointment with Dr. Burtis Junes.   Contact information:   [address] [place] [address] [phone]       Follow up with Jacqualine Mau On 05/12/2014.   Why:  @ 8:15 am follow up   Contact information:   [facility] [address] [place], [place] [address] [phone] (Office)       Follow up with [name], MD On 05/17/2014.    Specialty:  Cardiology   Why:  at 4pm in the [facility].--  Gate code [id number]. Please bring all medications to appt.   Contact information:   [address] [place] [address] [phone]       Discharge Instructions:  Ophthalmology (los ojos): 05/12/2014 at 8:15AM Dr. Lebron Conners  Internal Medicine: 05/13/2014 at 9:15 [facility] sotano) Dr. Burtis Junes  Meeting with Chauncey Reading (insurance card) to follow on 05/13/2014  Cardiology 05/17/2014 at 4:00PM Dr. Gala Romney  Plan de alimentacin con bajo contenido de sodio  (Low-Sodium Eating Plan)    El sodio aumenta la presin arterial y hace que el cuerpo retenga lquidos. El consumo de alimentos con menos sodio ayuda a Conservator, museum/gallery presin arterial, a Building services engineer y a Physicist, medical, el hgado y los riones. El hecho de agregar sal (cloruro de sodio) a los alimentos aumenta el aporte de [place]. La mayor parte del sodio proviene de los alimentos enlatados, envasados y congelados. La pizza, la comida rpida y la comida de los restaurantes tambin contienen mucho sodio. Aunque usted tome medicamentos para bajar la presin arterial o reducir el lquido del cuerpo, es importante que disminuya el aporte de sodio de los alimentos.  EN QU CONSISTE EL PLAN?  La [employer] de las personas deberan limitar la ingesta de sodio a 2300 mg por da. El mdico le recomienda que limite su consumo de sodio a __________ [employer].  QU DEBO SABER ACERCA DE ESTE PLAN DE ALIMENTACIN?  Para el plan de alimentacin con bajo contenido de sodio, debe seguir estas pautas generales:  Elija alimentos con un valor porcentual diario de sodio de menos del 5 % (segn se indica en la etiqueta).  Use hierbas o aderezos sin sal, en lugar de sal de mesa o sal marina.  Consulte al mdico o farmacutico antes de usar sustitutos de la sal.  Coma alimentos frescos.  Coma ms frutas y verduras.  Limite las verduras enlatadas. Si las consume,  enjuguelas bien para disminuir el sodio.  Limite el consumo de queso a 1 oz (28 g) por da.  Coma productos con bajo contenido de sodio, cuya etiqueta suele decir "bajo contenido de sodio" o "sin agregado de sal".  Evite alimentos que contengan glutamato monosdico (MSG), que a veces se agrega a la comida [place] y a algunos alimentos enlatados.  Consulte las etiquetas de los alimentos (etiquetas de informacin nutricional) para saber cunto sodio contiene una porcin.  Consuma ms alimentos caseros y [employer] comida rpida, de bufs o de restaurantes.  Cuando coma en un restaurante, pida que le  preparen la comida con menos sal o, si es posible, sin nada de sal. CMO LEO LA INFORMACIN SOBRE EL SODIO EN LAS ETIQUETAS DE LOS ALIMENTOS?  La etiqueta de informacin nutricional indica la cantidad de sodio en una porcin de alimento. Si come ms de una porcin, debe multiplicar la cantidad indicada de sodio por la cantidad de porciones.  Las etiquetas de los alimentos tambin pueden indicar lo siguiente:  Sin sodio: menos de 5 mg por porcin.  Cantidad muy baja de sodio: 35 mg o menos por porcin.  Cantidad baja de sodio: 140 mg o menos por porcin.  Menor cantidad de sodio: 50 % menos de sodio en una porcin. Por ejemplo, si un alimento que por lo general contiene 300 mg de sodio se modifica para incluir una menor cantidad de sodio, tendr 150 mg de sodio.  Sodio reducido: 25 % menos de sodio en una porcin. Por ejemplo, si un alimento que por lo general contiene 400 mg de sodio se modifica para convertirse en un alimento de sodio reducido, tendr 300 mg de sodio. QU ALIMENTOS PUEDO COMER?  Cereales  Cereales con bajo contenido de sodio, como [place], arroz y trigo [place], y trigo triturado. Galletas con bajo contenido de [place]. Arroz y pastas sin sal. Pan con bajo contenido de [place].  Vegetales  Verduras frescas o congeladas. Verduras enlatadas con bajo contenido de sodio o reducido de sodio. Pasta y  salsa de tomate con contenido bajo o reducido de sodio. Jugos de tomate y verduras con contenido bajo o reducido de sodio.  Nils Pyle  Frutas frescas, congeladas y Primary school teacher. Jugo de frutas.  Carnes y otras fuentes de protenas  Atn y salmn enlatado con bajo contenido de [place]. Carne de vaca o ave, pescado y frutos de mar frescos o congelados. Cordero. Frutos secos sin sal. Lentejas, frijoles y guisantes secos, sin sal agregada. Frijoles enlatados sin sal. Sopas caseras sin sal. Huevos.  Lcteos  Leche. Leche de soja. Queso ricota. Quesos con contenido bajo o reducido de sodio. Yogur.  Condimentos  Hierbas y especias frescas y secas. Aderezos sin sal. Cebolla y ajo en polvo. Variedades de mostaza y ketchup con bajo contenido de [place]. Jugo de limn.  Grasas y aceites  Aderezos para ensalada con contenido reducido de [place]. Mantequilla sin sal.  Otros  Palomitas de maz y pretzels sin sal.  Esta no es una lista completa de los alimentos o las bebidas recomendados. Consulte a su nutricionista para conocer ms opciones.  QU ALIMENTOS NO ESTN RECOMENDADOS?  Cereales  Cereales instantneos para comer caliente. Mezclas para bizcochos, panqueques y rellenos de pan. Crutones. Mezclas para pastas o arroz con condimento. Envases comerciales de sopa de fideos. Macarrones con queso envasados o congelados. Harina leudante. Galletas saladas comunes.  Vegetales  Verduras enlatadas comunes. Pasta y salsa de tomate en lata comunes. Jugos comunes de tomate y de verduras. Verduras Hydrologist. Papas fritas saladas. Aceitunas. Rosita Fire. Salsas. Chucrut. Salsa.  Carnes y [address] fuentes de protenas  Carne de [place], pescado o frutos de mar que est [place], [place], [place], condimentada con especias o con pickles. Panceta, jamn, salchichas, hot dogs, carne curada, carne picada (carne envasada de buey) y embutidos. Cerdo salado. Cecina o charqui. Arenque en escabeche. Anchoas, atn enlatado comn y sardinas.  Frutos secos con sal.  Celine Mans para untar y quesos procesados. Requesn. Queso azul y cottage. Suero de [place].  Condimentos  Sal de cebolla y ajo, sal condimentada, sal de mesa y sal marina.  Salsas en lata y envasadas. Salsa [place]. Salsa trtara. Salsa barbacoa. Salsa teriyaki. Salsa de soja, incluso la que tiene contenido reducido de [place]. Salsa de carne. Salsa de pescado. Salsa de [place]. Salsa rosada. Rbanos picantes. Ketchup y mostaza comunes. Saborizantes y tiernizantes para carne. Caldo en cubitos. Salsa picante. Salsa tabasco. Adobos. Aderezos para tacos. Salsas.  Grasas y aceites  Aderezos comunes para ensalada. Mantequilla con sal. Margarina. Mantequilla clarificada. Grasa de panceta.  Otros  Nachos y papas fritas envasadas. Maz inflado y frituras de maz. Palomitas de maz y pretzels con sal. Sopas enlatadas o en polvo. Pizza. Pasteles y entradas congeladas.  Esta no es [employer] de los alimentos y las bebidas que Personnel officer. Consulte a su nutricionista para obtener ms informacin.  Document Released: 03/11/2005 Document Revised: 03/16/2013  [facility] Patient Information 2015 [place], [place]. This information is not intended to replace advice given to you by your health care provider. Make sure you discuss any questions you have with your health care provider.    Consultations: Treatment Team:  Cassell Clement, MD  Procedures Performed:  Dg Chest 2 View  05/09/2014   CLINICAL DATA:  Left chest pain extra side left chest and arm pain tonight. Shortness of breath.  EXAM: CHEST  2 VIEW  COMPARISON:  Single view of the chest 05/02/2014. PA and lateral chest 03/19/2015.  FINDINGS: Lung volumes somewhat low with crowding of the bronchovascular structures. No consolidative process, pneumothorax or effusion is identified. No focal bony abnormality.  IMPRESSION: No acute finding in a low volume chest.   Electronically Signed   By: Drusilla Kanner M.D.   On:  05/09/2014 23:00   Dg Chest Port 1 View  05/02/2014   CLINICAL DATA:  Left chest pain.  Left hand numbness.  EXAM: PORTABLE CHEST - 1 VIEW  COMPARISON:  PA and lateral chest 03/18/2014.  FINDINGS: Lung volumes are lower than on the comparison study with crowding of the bronchovascular structures. Lungs are clear. Heart size is normal. No pneumothorax or pleural effusion.  IMPRESSION: No acute disease.   Electronically Signed   By: Drusilla Kanner M.D.   On: 05/02/2014 13:41    Admission HPI:  49 yo spanish-speaking man from [place] with hx of HTN, GERD, corneal tx, combined CHF EF 30-35%, recent admission for chest pain 05/02/14 to 05/04/14, comes back for chest pain.  Chest pain started on 2/13, had mild cp that day and also the next day. Then today in the morning his pain increased. It's located on the left side of his chest, under his left breast. It hurts to move around, pain increases with movement. Took nitro without help 2x in the morning. GI cocktail in ED helped with nausea, but didn't change the pain. Having some dry cough for 1 month about without sputum production. No injury to the chest. Has cleaned some snow but that was after the pain already started per patient. Has had some spicy food too but pain preceded that per patient. No alcohol use currently, not a smoker. Denies any volume changes or swelling.   Had 2014 abnormal stress PET follows by normal cardiac cath. Last admisison had diffuse TWI which were determined to be old. cardiology did not believe his CP to be ischemic last time. Had 6 total troponin, only once 0.19, thought to be erroneous. Cards believe his CP was related to his CHF and recommended no further workup. His CP resolved before discharge. He was sent home with asa  daily, coreg  25mg  BID, enalapril 20mg  bid, lasix 20mg  daily, and simvastatin 10mg  daily. as been taking all of his meds that he was discharged on, except statin. Had some trouble getting the meds at the  pharmacy when they went there.  Last discharge with 102.604kg. No weight from today.   Hospital Course by problem list: Principal Problem:   Chest pain Active Problems:   HTN (hypertension)   Congestive dilated cardiomyopathy   Hyperlipidemia   GERD (gastroesophageal reflux disease)   [name] is a 49 yo male with recent admission for chest pain thought to be from CHF who presented with recurrent chest pain that was found to be musculoskeletal in nature.  Chest Pain: Evaluated by cardiology. Patient's tenderness to palpation and pain on movement suggest musculoskeletal origin. 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 CXR negative for fracture, no rash on exam. Patient had diffuse TWI last admission, and on that admission troponins were all negative except one value of 0.19, which was thought to be errorenous. Cardiology was consulted at that time and did not think his pain was anginal. They thought it was from CHF/volume overload. On this admission, EKG is unchanged. He has no signs of volume overload on exam. BNP is mildy lelevated at 130. Abnormal stress PET scan 2014, normal coronaries on cath 2014. Troponins 0.03-->0.03-->0.05.  Continuing beta blocker, ACE inhibitor, aspirin and statin therapy (consider increased statin dosing, though patient has not been taking it, so will focus on medication acquisition first). Continuing to treat pain with flexiril, heat packs, tylenol.  GERD: Nausea/belching improved with GI cocktail. Continuing ranitidine and protonix.  Combined CHF: Patient has dilated cardiomyopathy- [facility] 05/03/14 EF 30-35%, diffuse hypokinesis, mod dilated LV, restrictive filling pattern. Continuing home medications: aspirin 81mg  daily, coreg 25mg  BID, enalapril 20mg  BID, lasix 20mg  daily, simvastatin 10mg  daily. Not typical presentation for T. Cruzi (no arrhythmias); could consider antibiody testing for T. Cruzi if it is determined that he has  not been tested in past. Consider HIV as outpatient.  HTN: Was normotensive in hospital (133/84 at discharge). Patient has had difficulty obtaining his home: coreg 25mg  BID, lasix 20mg  daily, enalapril 20mg  BID.  HLD: Cholesterol 213, triglycerides 194, HDL 34, LDL 140 on 05/02/14. Recently started on simvastatin. May need increased dose (but has not been able to obtain medication - will focus on acquisition first).  Subclinical Hypothyroidism: TSH 5.722 on last admission, T4 low normal 0.84. To repeat as outpatient in 3 months.  R eye pain/blurry vision: has appt on 05/12/14 at 8:15 with Dr. Earlene Plater at [facility]. Patient has a history of corneal transplant in Nov 2014 by Dr. Earlene Plater in [place]. There was concern for possible corneal transplant rejection and thus ophthalmology was consulted last admission Ophtho recommended for him to start on Ofloxacin eye drops and Prednisolone eye drops, and to follow up with Dr. Earlene Plater on 2/18.  Discharge Vitals:   BP 133/84 mmHg  Pulse 70  Temp(Src) 98.3 F (36.8 C) (Oral)  Resp 22  Ht 5' 9.5" (1.765 m)  Wt 228 lb 4.8 oz (103.556 kg)  BMI 33.24 kg/m2  SpO2 97%  Discharge Labs:  Results for orders placed or performed during the hospital encounter of 05/09/14 (from the past 24 hour(s))  CBC     Status: Abnormal   Collection Time: 05/09/14 10:28 PM  Result Value Ref Range   WBC 11.3 (H) 4.0 - 10.5 K/uL   RBC 4.99 4.22 - 5.81 MIL/uL  Hemoglobin 14.0 13.0 - 17.0 g/dL   HCT 16.1 09.6 - 04.5 %   MCV 83.6 78.0 - 100.0 fL   MCH 28.1 26.0 - 34.0 pg   MCHC 33.6 30.0 - 36.0 g/dL   RDW 40.9 81.1 - 91.4 %   Platelets 328 150 - 400 K/uL  Basic metabolic panel     Status: Abnormal   Collection Time: 05/09/14 10:28 PM  Result Value Ref Range   Sodium 137 135 - 145 mmol/L   Potassium 3.6 3.5 - 5.1 mmol/L   Chloride 104 96 - 112 mmol/L   CO2 26 19 - 32 mmol/L   Glucose, Bld 106 (H) 70 - 99 mg/dL   BUN 18 6 - 23 mg/dL   Creatinine, Ser 7.82 0.50 - 1.35  mg/dL   Calcium 9.6 8.4 - 95.6 mg/dL   GFR calc non Af Amer 77 (L) >90 mL/min   GFR calc Af Amer 89 (L) >90 mL/min   Anion gap 7 5 - 15  BNP (order ONLY if patient complains of dyspnea/SOB AND you have documented it for THIS visit)     Status: Abnormal   Collection Time: 05/09/14 10:28 PM  Result Value Ref Range   B Natriuretic Peptide 139.1 (H) 0.0 - 100.0 pg/mL  I-stat troponin, ED (not at [facility])     Status: None   Collection Time: 05/09/14 10:33 PM  Result Value Ref Range   Troponin i, poc 0.01 0.00 - 0.08 ng/mL   Comment 3          Troponin I-serum (0, 3, 6 hours)     Status: None   Collection Time: 05/10/14  2:24 AM  Result Value Ref Range   Troponin I <0.03 <0.031 ng/mL  Troponin I-serum (0, 3, 6 hours)     Status: None   Collection Time: 05/10/14  6:20 AM  Result Value Ref Range   Troponin I <0.03 <0.031 ng/mL  CBC     Status: None   Collection Time: 05/10/14  6:20 AM  Result Value Ref Range   WBC 10.4 4.0 - 10.5 K/uL   RBC 4.80 4.22 - 5.81 MIL/uL   Hemoglobin 13.3 13.0 - 17.0 g/dL   HCT 21.3 08.6 - 57.8 %   MCV 84.4 78.0 - 100.0 fL   MCH 27.7 26.0 - 34.0 pg   MCHC 32.8 30.0 - 36.0 g/dL   RDW 46.9 62.9 - 52.8 %   Platelets 309 150 - 400 K/uL  Troponin I-serum (0, 3, 6 hours)     Status: Abnormal   Collection Time: 05/10/14  9:17 AM  Result Value Ref Range   Troponin I 0.05 (H) <0.031 ng/mL    Signed: Dionne Ano, MD 05/10/2014, 1:38 PM    Services Ordered on Discharge: none, to meet with Chauncey Reading on 2/19 for help acquiring [employer] on Discharge: none

## 2014-05-10 NOTE — Progress Notes (Signed)
UR completed 

## 2014-05-10 NOTE — Care Management Note (Signed)
    Page 1 of 1   05/10/2014     12:34:26 PM CARE MANAGEMENT NOTE 05/10/2014  Patient:  Phillip [name]   Account Number:  [id number]  Date Initiated:  05/10/2014  Documentation initiated by:  Gae Gallop  Subjective/Objective Assessment:   PTA pt from home, admitted with CP/CHF.     Action/Plan:   Return to home. Pt with no insurance. Refer pt to  Health Department for medication needs.   Anticipated DC Date:  05/12/2014   Anticipated DC Plan:  HOME/SELF CARE      DC Planning Services  CM consult  Follow-up appt scheduled  [facility]  Medication Assistance      Choice offered to / List presented to:             Status of service:  In process, will continue to follow Medicare Important Message given?  NO (If response is "NO", the following Medicare IM given date fields will be blank) Date Medicare IM given:   Medicare IM given by:   Date Additional Medicare IM given:   Additional Medicare IM given by:    Discharge Disposition:  HOME/SELF CARE  Per UR Regulation:  Reviewed for med. necessity/level of care/duration of stay  If discussed at Long Length of Stay Meetings, dates discussed:    Comments:  05/10/2014 @ 12:30  Gae Gallop RN,BSN, [phone] Pt is spanish speaking only. Intrepeter is needed with communicating care/ d/c plans. Pt has a return appointment with [facility] on 05/12/2014 @ 0815. Placed f/u on AVS. Per [facility] pt has medicaid on file , CM spoke with pt and per pt he has financial assistance with [facility], visits are $ 10.00. CM will inform financial  counselor of info. However in talking with pt , pt informed us that his medicaid has expired and will be using Obamacare effective 05/24/2014. Spoke with pt regarding medication, informed pt of  [employer] and [employer]. Pt has f/u  appointment with IM clinic on 05/13/2014 and will get further medication assistance. Heartfailure Navigator, Phillip [name], educated pt and will provide scale.

## 2014-05-10 NOTE — Progress Notes (Signed)
Pt discharged to home per MD order. Pt received and reviewed all discharge instructions and medication information including follow-up appointments and prescription information, including heart failure education with RN interpreter. Pt verbalized understanding. Pt alert and oriented at discharge with no complaints of pain. Pt IV and telemetry box removed prior to discharge. Pt escorted to private vehicle via wheelchair by nurse tech. Joylene Grapes

## 2014-05-10 NOTE — Progress Notes (Signed)
   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 No  Would patient like information on creating an advanced directive? Yes - Educational materials given  Chaplain responded to spiritual care consult for prayer and help with advanced directive. Chaplain used phone translating service, was able to introduce self and AD form, but translator connection was lost and chaplain could not get it back. Pt daughter at bedside; chaplain provided prayer with pt with help from pt daughter who translated the prayer. Pt has a spanish advanced directive form to look over, and chaplain let them know they can ask for assistance if they need it.   Wille Glaser 05/10/2014 9:52 AM

## 2014-05-10 NOTE — Consult Note (Signed)
CARDIOLOGY CONSULT NOTE   Patient ID: Phillip [name] MRN: [id number], DOB/AGE: [birth year]   Admit date: 05/09/2014 Date of Consult: 05/10/2014   Primary Physician: Pcp Not In System Primary Cardiologist: None  Pt. Profile  49 year old Spanish-speaking man from [place].  Family member is present for interpretation.  Admitted with chest pain.  Problem List  Past Medical History  Diagnosis Date  . CHF (congestive heart failure)   . Hypertension   . Chronic back pain     "neck down" (07/20/2013)  . GERD (gastroesophageal reflux disease)   . High cholesterol   . Cardiac insufficiency   . Renal calculus     Past Surgical History  Procedure Laterality Date  . Eye surgery Right   . Corneal transplant Right ~ 2010  . Cataract extraction w/ intraocular lens implant Right 02/16/2014  . Cardiac catheterization  2014?     Allergies  No Known Allergies  HPI   This 49 year old gentleman was readmitted with chest pain.  He was recently admitted between February 8 and 05/04/2014 with chest pain.  Cardiac enzymes at that time were unremarkable.  EKG at that time showed diffuse T-wave abnormalities.  His past history reveals that he had a abnormal stress PET scan in 2014 but had a subsequent cardiac catheterization which was normal.  His echocardiograms most recently on 05/03/14 showed left ventricular systolic dysfunction with ejection fraction 30-35% and wall motion abnormalities.  There was abnormal diastolic function with restrictive physiology. On this admission the patient complains of left-sided chest discomfort in a linear distribution extending from the lower sternum laterally to the anterior axillary line.  He is tender to palpation. His cardiac enzymes are again normal.  Inpatient Medications  . aspirin EC  81 mg Oral Daily  . carvedilol  25 mg Oral BID WC  . cyclobenzaprine  7.5 mg Oral TID  . furosemide  20 mg Oral Daily  . heparin  5,000 Units Subcutaneous 3  times per day  . lisinopril  40 mg Oral Daily  . ofloxacin  1 drop Right Eye QID  . pantoprazole  40 mg Oral Daily  . prednisoLONE acetate  1 drop Right Eye TID  . simvastatin  10 mg Oral QHS    Family History History reviewed. No pertinent family history.   Social History History   Social History  . Marital Status: Married    Spouse Name: N/A  . Number of Children: N/A  . Years of Education: N/A   Occupational History  . Not on file.   Social History Main Topics  . Smoking status: Never Smoker   . Smokeless tobacco: Never Used  . Alcohol Use: No  . Drug Use: No  . Sexual Activity: Yes   Other Topics Concern  . Not on file   Social History Narrative     Review of Systems  General:  No chills, fever, night sweats or weight changes.  Cardiovascular:  No chest pain, dyspnea on exertion, edema, orthopnea, palpitations, paroxysmal nocturnal dyspnea. Dermatological: No rash, lesions/masses Respiratory: No cough, dyspnea Urologic: No hematuria, dysuria Abdominal:   No nausea, vomiting, diarrhea, bright red blood per rectum, melena, or hematemesis Neurologic:  No visual changes, wkns, changes in mental status. All other systems reviewed and are otherwise negative except as noted above.  Physical Exam  Blood pressure 145/94, pulse 66, temperature 97.4 F (36.3 C), temperature source Oral, resp. rate 20, height 5' 9.5" (1.765 m), weight 228 lb 4.8 oz (103.556 kg), SpO2 99 %.  General: Pleasant, NAD.  Spanish speaking Psych: Normal affect. Neuro: Alert and oriented X 3. Moves all extremities spontaneously. HEENT: Normal  Neck: Supple without bruits or JVD. Lungs:  Resp regular and unlabored, CTA. Heart: RRR no s3, s4, or murmurs.  Tender along the lower left costal margin Abdomen: Soft, non-tender, non-distended, BS + x 4.  Extremities: No clubbing, cyanosis or edema. DP/PT/Radials 2+ and equal bilaterally.  Labs   Recent Labs  05/10/14 0224 05/10/14 0620    TROPONINI <0.03 <0.03   Lab Results  Component Value Date   WBC 10.4 05/10/2014   HGB 13.3 05/10/2014   HCT 40.5 05/10/2014   MCV 84.4 05/10/2014   PLT 309 05/10/2014     Recent Labs Lab 05/09/14 2228  NA 137  K 3.6  CL 104  CO2 26  BUN 18  CREATININE 1.09  CALCIUM 9.6  GLUCOSE 106*   Lab Results  Component Value Date   CHOL 213* 05/02/2014   HDL 34* 05/02/2014   LDLCALC 140* 05/02/2014   TRIG 194* 05/02/2014   Lab Results  Component Value Date   DDIMER <0.27 07/19/2013    Radiology/Studies  Dg Chest 2 View  05/09/2014   CLINICAL DATA:  Left chest pain extra side left chest and arm pain tonight. Shortness of breath.  EXAM: CHEST  2 VIEW  COMPARISON:  Single view of the chest 05/02/2014. PA and lateral chest 03/19/2015.  FINDINGS: Lung volumes somewhat low with crowding of the bronchovascular structures. No consolidative process, pneumothorax or effusion is identified. No focal bony abnormality.  IMPRESSION: No acute finding in a low volume chest.   Electronically Signed   By: Drusilla Kanner M.D.   On: 05/09/2014 23:00   Dg Chest Port 1 View  05/02/2014   CLINICAL DATA:  Left chest pain.  Left hand numbness.  EXAM: PORTABLE CHEST - 1 VIEW  COMPARISON:  PA and lateral chest 03/18/2014.  FINDINGS: Lung volumes are lower than on the comparison study with crowding of the bronchovascular structures. Lungs are clear. Heart size is normal. No pneumothorax or pleural effusion.  IMPRESSION: No acute disease.   Electronically Signed   By: Drusilla Kanner M.D.   On: 05/02/2014 13:41    ECG  Normal sinus rhythm.  Widespread T-wave inversion in the inferolateral leads.  The tracing is unchanged from prior tracings  ASSESSMENT AND PLAN  1.  Chest pain felt to be secondary to musculoskeletal chest wall pain.  He is tender along the left rib margin and left costosternal junction. 2.  Chronic combined systolic and diastolic heart failure, consistent with nonischemic  cardiomyopathy.  Normal cardiac catheterization in 2014.  Recommendation: Continue treatment for his nonischemic cardiomyopathy with beta blocker, ACE inhibitor, aspirin, and statin therapy.  Statin therapy may need to be increased.  He is not yet to goal.  No further in-hospital cardiac tests indicated at this point. His presentation is not typical for Chagas disease but if he has not had previous antibody testing for T. Cruzi, this may be worthwhile to obtain a guide future therapy.  He has not been having any of the arrhythmias typical for Chagas disease. Empiric therapy for his chest wall pain.   Signed, Cassell Clement, MD 05/10/2014, 8:11 AM

## 2014-05-10 NOTE — Progress Notes (Signed)
Subjective: Interview was done using Phillip [name], Charity fundraiser, who acted as Research officer, trade union.  Phillip [name] is feeling well this morning. He has some residual pain in his left chest that is worsened by palpation or movement. He has not had any shortness of breath or other symptoms. He is concerned about having to pay for the days he has missed at work (he must miss 7 days at work in order to have a medical leave, and he has only missed 4). Asking questions about whether he needs to stick to a low-salt diet. Knows that frozen foods have high salt content, and that he should avoid pork rinds. He is motivated to stay healthy for his young child.  Objective: Vital signs in last 24 hours: Filed Vitals:   05/10/14 0138 05/10/14 0215 05/10/14 0400 05/10/14 0806  BP: 127/104 137/91 140/94 145/94  Pulse: 70  65 66  Temp: 98.8 F (37.1 C)  98.1 F (36.7 C) 97.4 F (36.3 C)  TempSrc: Oral  Oral Oral  Resp: Height: 5' 9.5" (1.765 m)     Weight: 228 lb 8 oz (103.647 kg)  228 lb 4.8 oz (103.556 kg)   SpO2: 99%  96% 99%   Physical Exam: Appearance: in NAD, lying in bed looking out the window HEENT: AT/[place], PERRL, EOMi, no lymphadenopathy Heart: RRR, normal S1S2, no MRG, tenderness to palpation and to movement of the arm in the lower left costal margin Lungs: CTAB, no WRR Abdomen: BS+, soft, nontender this morning, round belly, but nondistended Musculoskeletal: normal range of motion, trace edema Neurologic: A&Ox3, grossly intact Skin: warm and dry, no rashes or lesions  Lab Results: Basic Metabolic Panel:  Recent Labs Lab 05/04/14 0046 05/09/14 2228  NA 138 137  K 3.7 3.6  CL 103 104  CO2 29 26  GLUCOSE 113* 106*  BUN 23 18  CREATININE 1.14 1.09  CALCIUM 8.8 9.6   CBC:   Recent Labs Lab 05/09/14 2228 05/10/14 0620  WBC 11.3* 10.4  HGB 14.0 13.3  HCT 41.7 40.5  MCV 83.6 84.4  PLT 328 309   Cardiac Enzymes:  Recent Labs Lab 05/10/14 0224 05/10/14 0620  05/10/14 0917  TROPONINI <0.03 <0.03 0.05*   Thyroid Function Tests:  Recent Labs Lab 05/04/14 0046  FREET4 0.84   Urine Drug Screen: Drugs of Abuse     Component Value Date/Time   LABOPIA NONE DETECTED 07/25/2013 0220   COCAINSCRNUR NONE DETECTED 07/25/2013 0220   LABBENZ NONE DETECTED 07/25/2013 0220   AMPHETMU NONE DETECTED 07/25/2013 0220   THCU NONE DETECTED 07/25/2013 0220   LABBARB NONE DETECTED 07/25/2013 0220     Studies/Results: Dg Chest 2 View  05/09/2014   CLINICAL DATA:  Left chest pain extra side left chest and arm pain tonight. Shortness of breath.  EXAM: CHEST  2 VIEW  COMPARISON:  Single view of the chest 05/02/2014. PA and lateral chest 03/19/2015.  FINDINGS: Lung volumes somewhat low with crowding of the bronchovascular structures. No consolidative process, pneumothorax or effusion is identified. No focal bony abnormality.  IMPRESSION: No acute finding in a low volume chest.   Electronically Signed   By: Drusilla Kanner M.D.   On: 05/09/2014 23:00   Medications: I have reviewed the patient's current medications. Scheduled Meds: . aspirin EC  81 mg Oral Daily  . carvedilol  25 mg Oral BID WC  . cyclobenzaprine  7.5 mg Oral TID  . furosemide  20 mg Oral Daily  .  heparin  5,000 Units Subcutaneous 3 times per day  . lisinopril  40 mg Oral Daily  . Living Better with Heart Failure Book - in Spanish   Does not apply Once  . ofloxacin  1 drop Right Eye QID  . pantoprazole  40 mg Oral Daily  . prednisoLONE acetate  1 drop Right Eye TID  . simvastatin  10 mg Oral QHS   Continuous Infusions:  PRN Meds:.acetaminophen, HYDROmorphone (DILAUDID) injection, morphine injection, nitroGLYCERIN, ondansetron (ZOFRAN) IV Assessment/Plan: Principal Problem:   Chest pain Active Problems:   HTN (hypertension)   Congestive dilated cardiomyopathy   Hyperlipidemia   GERD (gastroesophageal reflux disease)  Phillip [name] is a 49 yo male with recent admission for chest  pain thought to be from CHF who presented with recurrent chest pain.   Chest Pain: Patient's tenderness to palpation and pain on movement suggest musculoskeletal origin. 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 CXR negative for fracture, no rash on exam. Patient had diffuse TWI last admission, and on that admission troponins were all negative except one value of 0.19, which was thought to be errorenous. Cardiology was consulted at that time and did not think his pain was anginal. They thought it was from CHF/volume overload. On this admission, EKG is unchanged. He has no signs of volume overload on exam. BNP is mildy lelevated at 130. Abnormal stress PET scan 2014, normal coronaries on cath 2014. Troponins 0.03-->0.03-->0.05.  - Appreciate cardiology consult - Continue beta blocker, ACE inhibitor, aspirin and statin therapy (consider increased statin dosing, though patient has not been taking it, so will focus on medication acquisition first) - Continue to treat pain with flexiril, morphine PRN, heat pack, tylenol  GERD: Nausea/belching improved with GI cocktail - Continue famotidine and protonix  Combined CHF:  Patient has dilated cardiomyopathy- [facility] 05/03/14 EF 30-35%, diffuse hypokinesis, mod dilated LV, restrictive filling pattern.  - Continue home medications: aspirin 81mg  daily, coreg 25mg  BID, enalapril 20mg  BID, lasix 20mg  daily, simvastatin 10mg  daily. - Not typical presentation for T. Cruzi (no arrhythmias); could consider antibiody testing for T. Cruzi if it is determined that he has not been tested in past - Consider HIV as outpatient  HTN: Currently 145/94. - Continue home meds: coreg 25mg  BID, lasix 20mg  daily, enalapril 20mg  BID - Patient has had difficulty obtaining medications as outpatient  HLD: Recently started on simvastatin - Continue; may need increased dose (but had not been able to obtain medication - will focus on acquisition  first)  Subclinical Hypothyroidism: TSH 5.722 on last admission, T4 low normal 0.84.  - Plan to repeat in 3 months  R eye pain/blurry vision: has appt on 05/12/14 at 8:15 with Phillip [name] at [facility]. Patient has a history of corneal transplant in Nov 2014 by Phillip [name] in [place]. There was concern for possible corneal transplant rejection and thus ophthalmology was consulted last admission Ophtho recommended for him to start on Ofloxacin eye drops and Prednisolone eye drops, and to follow up with Phillip [name]. - Continue drops - Make sure patient knows of appointment  Leukocytosis: Downtrending 11.3-->10.4 unclear etiology. Monitor for any signs of infection.  Dispo: Disposition is deferred at this time, awaiting improvement of current medical problems.  Anticipated discharge in approximately 0 day(s).   The patient does not does need an [facility] hospital follow-up appointment after discharge.  The patient does have transportation limitations that hinder transportation to clinic appointments.  .Services Needed at time  of discharge: Y = Yes, Blank = No PT:   OT:   RN:   Equipment:   Other:       Dionne Ano, MD 05/10/2014, 11:22 AM

## 2014-05-11 LAB — HIV ANTIBODY (ROUTINE TESTING W REFLEX): HIV Screen 4th Generation wRfx: NONREACTIVE

## 2014-05-13 ENCOUNTER — Ambulatory Visit (INDEPENDENT_AMBULATORY_CARE_PROVIDER_SITE_OTHER): Payer: Self-pay | Admitting: Internal Medicine

## 2014-05-13 ENCOUNTER — Encounter: Payer: Self-pay | Admitting: Internal Medicine

## 2014-05-13 VITALS — BP 154/89 | HR 73 | Temp 98.2°F | Ht 69.0 in | Wt 228.4 lb

## 2014-05-13 DIAGNOSIS — R079 Chest pain, unspecified: Secondary | ICD-10-CM

## 2014-05-13 DIAGNOSIS — I420 Dilated cardiomyopathy: Secondary | ICD-10-CM

## 2014-05-13 NOTE — Progress Notes (Signed)
Subjective:   Patient ID: Phillip [name] male   DOB: 1965-07-23 49 y.o.   MRN: [id number]  HPI: [name] is a 49 y.o. spanish speaking man from [place] with pmh as listed below presents for HFU.  Pt was d/c on 05/10/14 for atypical CP thought to be 2/2 MSK pain but with some consideration of Chagas disease if pt has arrhthymias. Since that time pt has been feeling well without any new or recurring chest pain. He has his baseline dyspnea on exertion and continues to sleep sitting up but that has been going on for years with no acute changes. He denies any lower extremity edema, worsening abdominal girth, weight gain, headache, palpitations, blurry vision, or syncope. He has had very poor access to medications and brings in his medications today for review and will be seeing the financial counselor at the end of this visit. He was able to follow-up at [facility] for his corneal transplant.   Past Medical History  Diagnosis Date  . CHF (congestive heart failure)   . Hypertension   . Chronic back pain     "neck down" (07/20/2013)  . GERD (gastroesophageal reflux disease)   . High cholesterol   . Cardiac insufficiency   . Renal calculus    Current Outpatient Prescriptions  Medication Sig Dispense Refill  . aspirin EC 81 MG tablet Take 1 tablet (81 mg total) by mouth daily. (Patient not taking: Reported on 03/19/2014) 30 tablet 3  . carvedilol (COREG) 25 MG tablet Take 1 tablet (25 mg total) by mouth 2 (two) times daily with a meal. 60 tablet 3  . enalapril (VASOTEC) 20 MG tablet Take 1 tablet (20 mg total) by mouth 2 (two) times daily. (Patient not taking: Reported on 05/09/2014) 60 tablet 3  . furosemide (LASIX) 20 MG tablet Take 1 tablet (20 mg total) by mouth daily. 30 tablet 3  . lovastatin (MEVACOR) 20 MG tablet Take 1 tablet (20 mg total) by mouth at bedtime. 30 tablet 3  . nitroGLYCERIN (NITROSTAT) 0.4 MG SL tablet Place 1 tablet (0.4 mg total) under  the tongue every 5 (five) minutes as needed for chest pain (CP or SOB). 30 tablet 3  . prednisoLONE acetate (PRED FORTE) 1 % ophthalmic suspension Place 1 drop into the right eye 3 (three) times daily. 5 mL 0  . ranitidine (ZANTAC) 150 MG tablet Take 1 tablet (150 mg total) by mouth at bedtime. 30 tablet 3   No current facility-administered medications for this visit.   No family history on file. History   Social History  . Marital Status: Married    Spouse Name: N/A  . Number of Children: N/A  . Years of Education: N/A   Social History Main Topics  . Smoking status: Never Smoker   . Smokeless tobacco: Never Used  . Alcohol Use: No  . Drug Use: No  . Sexual Activity: Yes   Other Topics Concern  . None   Social History Narrative   Review of Systems: Pertinent items are noted in HPI. Objective:  Physical Exam: Filed Vitals:   05/13/14 0948  BP: 154/89  Pulse: 73  Temp: 98.2 F (36.8 C)  TempSrc: Oral  Height: 5\' 9"  (1.753 m)  Weight: 228 lb 6.4 oz (103.602 kg)  SpO2: 100%   General: sitting in chair, NAD  Cardiac: RRR, no rubs, murmurs or gallops Pulm: clear to auscultation bilaterally, no crackles, wheezes, or rhonchi, moving normal volumes of air Abd: soft,  nontender, nondistended, BS present Ext: warm and well perfused, no pedal edema Neuro: alert and oriented X3, cranial nerves II-XII grossly intact  Assessment & Plan:  Please see problem oriented charting  Pt discussed with Dr. Heide Spark

## 2014-05-13 NOTE — Assessment & Plan Note (Signed)
Pt had HIV negative, T. Cruzi was discussed in hospital but pt had atypical characteristics to suggest Chagas disease. Pt has no insurnace therefore testing was deferred today. Pt euvolemic on exam today. Extensive >30 min time was had discussing importance of taking each medication and pt will be seeing financial counselor after this visit to help provide resources. The patient did not have any aspirin and was told to pick this up over-the-counter. Wt Readings from Last 3 Encounters:  05/13/14 228 lb 6.4 oz (103.602 kg)  05/10/14 228 lb 4.8 oz (103.556 kg)  07/24/13 223 lb (101.152 kg)    -pt on carvedilol, enalapril, and lasix -f/u with cardiology on 05/17/13

## 2014-05-13 NOTE — Assessment & Plan Note (Signed)
Pt has now had complete resolution of CP. Follow up with cardiology on 05/17/14.

## 2014-05-16 NOTE — Progress Notes (Signed)
INTERNAL MEDICINE TEACHING ATTENDING ADDENDUM - Minetta Krisher, MD: I reviewed and discussed at the time of visit with the resident Dr. Sadek, the patient's medical history, physical examination, diagnosis and results of pertinent tests and treatment and I agree with the patient's care as documented.  

## 2014-05-16 NOTE — Telephone Encounter (Signed)
Closed encounter °

## 2014-05-17 ENCOUNTER — Ambulatory Visit (HOSPITAL_COMMUNITY)
Admit: 2014-05-17 | Discharge: 2014-05-17 | Disposition: A | Discharge status: Home / Self Care | Payer: Self-pay | Source: Ambulatory Visit | Attending: Cardiology | Admitting: Cardiology

## 2014-05-17 ENCOUNTER — Telehealth (HOSPITAL_COMMUNITY): Payer: Self-pay

## 2014-05-17 ENCOUNTER — Encounter (HOSPITAL_COMMUNITY): Payer: Self-pay

## 2014-05-17 VITALS — BP 140/82 | HR 68 | Wt 229.0 lb

## 2014-05-17 DIAGNOSIS — I420 Dilated cardiomyopathy: Secondary | ICD-10-CM

## 2014-05-17 DIAGNOSIS — I10 Essential (primary) hypertension: Secondary | ICD-10-CM

## 2014-05-17 DIAGNOSIS — I5022 Chronic systolic (congestive) heart failure: Secondary | ICD-10-CM | POA: Insufficient documentation

## 2014-05-17 MED ORDER — SPIRONOLACTONE 25 MG PO TABS: 12.5000 mg | ORAL_TABLET | Freq: Every day | ORAL | Status: DC

## 2014-05-17 NOTE — Telephone Encounter (Signed)
error 

## 2014-05-17 NOTE — Progress Notes (Signed)
Patient ID: Phillip [name], male   DOB: 03/05/1966, 49 y.o.   MRN: [id number] PCP: Dr Sadek  49 yo with history of HTN and cardiomyopathy presents for cardiology evaluation.  He has been hypertensive since age 33.  He is from [place] and moved here about 3 years ago.  He does not speak English.  In 2014, he apparently had chest pain and had a stress PET at [facility].  Cardiac cath at that time did not show obstructive disease.  He had an echo here in 4/15 that showed normal EF, 55-60%.  In 2/16, he was admitted twice with chest pain.  Both times, symptoms were thought to be atypical for ischemia. During the first admission, there was one troponin of 0.19 while others were normal.  During the second admission, there was one troponin to 0.05 while the others were normal.  Echo was done in 2/16, showing EF down to 30-35% with inferior and inferoseptal wall motion abnormality.  He did not have an ischemic workup.  Of note, he had run out of his BP meds in 12/15.  Currently, he tells me (via interpreter) that he has not had any further chest pain since leaving the hospital.  He has exertional dyspnea that has gradually worsened over a number of months.  He is short of breath ("feels choked") with heavy exertion and also with climbing a flight of steps.  He sleeps on 2 pillows.  He has occasional episodes that might be consistent with PND.  No lightheadedness or syncope.  No tachypalpitations.    ECG: NSR, lateral/anterolateral T wave inversions  Labs (2/16): HIV negative, K 3.6, creatinine 1.09, hemoglobin 13.3  PMH: 1. HTN: Diagnosed at age 33. 2. GERD 3. Hyperlipidemia 4. Nephrolithiasis 5. Cardiomyopathy: 2014 abnormal stress PET at [facility], had catheterization that per report showed no significant coronary disease.  Echo (4/15) with EF 55-60%.  Echo (2/16) with EF 30-35%, akinesis of the basal to mid inferior and inferoseptal walls, apex poorly visualized, restrictive diastolic function, mild MR.  HIV  negative in 2/16.   SH: Married, from [place], speaks only Spanish, works in factory, nonsmoker, occasional ETOH.   FH: No h/o cardiomyopathy or CAD that he knows of.  Mother and sister with HTN.  ROS: All systems reviewed and negative except as per HPI.   Current Outpatient Prescriptions  Medication Sig Dispense Refill  . carvedilol (COREG) 25 MG tablet Take 1 tablet (25 mg total) by mouth 2 (two) times daily with a meal. 60 tablet 3  . enalapril (VASOTEC) 20 MG tablet Take 1 tablet (20 mg total) by mouth 2 (two) times daily. 60 tablet 3  . furosemide (LASIX) 20 MG tablet Take 1 tablet (20 mg total) by mouth daily. 30 tablet 3  . lovastatin (MEVACOR) 20 MG tablet Take 1 tablet (20 mg total) by mouth at bedtime. 30 tablet 3  . nitroGLYCERIN (NITROSTAT) 0.4 MG SL tablet Place 1 tablet (0.4 mg total) under the tongue every 5 (five) minutes as needed for chest pain (CP or SOB). 30 tablet 3  . prednisoLONE acetate (PRED FORTE) 1 % ophthalmic suspension Place 1 drop into the right eye 3 (three) times daily. 5 mL 0  . ranitidine (ZANTAC) 150 MG tablet Take 1 tablet (150 mg total) by mouth at bedtime. 30 tablet 3  . aspirin EC 81 MG tablet Take 1 tablet (81 mg total) by mouth daily. (Patient not taking: Reported on 03/19/2014) 30 tablet 3  . spironolactone (ALDACTONE) 25 MG   tablet Take 0.5 tablets (12.5 mg total) by mouth daily. 90 tablet 3   No current facility-administered medications for this encounter.   BP 140/82 mmHg  Pulse 68  Wt 229 lb (103.874 kg)  SpO2 95%' General: NAD Neck: No JVD, no thyromegaly or thyroid nodule.  Lungs: Clear to auscultation bilaterally with normal respiratory effort. CV: Nondisplaced PMI.  Heart regular S1/S2, no S3/S4, no murmur.  No peripheral edema.  No carotid bruit.  Normal pedal pulses.  Abdomen: Soft, nontender, no hepatosplenomegaly, no distention.  Skin: Intact without lesions or rashes.  Neurologic: Alert and oriented x 3.  Psych: Normal  affect. Extremities: No clubbing or cyanosis.  HEENT: Normal.   Assessment/Plan: 1. Chronic systolic CHF: Echo (2/16) with EF 30-35%, down from 55-60% about a year ago.  There are inferior and inferoseptal regional wall motion abnormalities. Etiology of cardiomyopathy is uncertain. It could be HTN as he had been off his meds.  However, with recent drop in EF, chest pain, regional wall motion abnormalities, and mild troponin rise on labs in the hospital in 2/16, I think that he merits an ischemic workup.  Another consideration, given his country of origin, would be Chagas cardiomyopathy.  He has NYHA class II-III symptoms but does not appear significantly volume overloaded on exam.  - I will arrange for coronary angiography to definitively diagnose CAD as a cause of his cardiomyopathy . - If no significant coronary disease, would be reasonable to do cardiac MRI to assess for Chagas involvement (can see late gadolinium enhancement, LV and RV aneurysms - these were not seen on echo but echo was a somewhat limited study).  He has not had any of the arrhythmias that typically go along with Chagas cardiomyopathy.  - Continue current Lasix, Coreg, and enalapril.  - Add spironolactone 12.5 mg daily with BMET next week.  - He will continue ASA 81 and statin given concern for possible CAD. 2. HTN: BP borderline today.  As above, starting spironolactone.    Followup in 2 wks.   Krystal Teachey 05/17/2014  

## 2014-05-17 NOTE — Patient Instructions (Signed)
COMIENZO espironolactona 12,5 mg ( 1/2 comprimido) una vez al [employer] .  El cateterismo cardaco izquierdo 3 de [facility] ( vase la hoja de informacin por separado ) .  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  Seguimiento de 1 mes para la cita de [place].  Do the following things EVERYDAY: 1) Weigh yourself in the morning before breakfast. Write it down and keep it in a log. 2) Take your medicines as prescribed 3) Eat low salt foods-Limit salt (sodium) to 2000 mg per day.  4) Stay as active as you can everyday 5) Limit all fluids for the day to less than 2 liters

## 2014-05-18 ENCOUNTER — Ambulatory Visit: Payer: Self-pay | Admitting: Internal Medicine

## 2014-05-23 ENCOUNTER — Other Ambulatory Visit: Payer: Self-pay | Admitting: Internal Medicine

## 2014-05-24 ENCOUNTER — Ambulatory Visit (HOSPITAL_COMMUNITY)
Admission: RE | Admit: 2014-05-24 | Discharge: 2014-05-24 | Disposition: A | Discharge status: Home / Self Care | Payer: Self-pay | Source: Ambulatory Visit | Attending: Cardiology | Admitting: Cardiology

## 2014-05-24 DIAGNOSIS — I5022 Chronic systolic (congestive) heart failure: Secondary | ICD-10-CM

## 2014-05-24 DIAGNOSIS — I10 Essential (primary) hypertension: Secondary | ICD-10-CM | POA: Insufficient documentation

## 2014-05-24 LAB — BASIC METABOLIC PANEL
Anion gap: 5 (ref 5–15)
BUN: 22 mg/dL (ref 6–23)
CHLORIDE: 108 mmol/L (ref 96–112)
CO2: 29 mmol/L (ref 19–32)
CREATININE: 1.08 mg/dL (ref 0.50–1.35)
Calcium: 9.4 mg/dL (ref 8.4–10.5)
GFR calc Af Amer: >90 mL/min (ref >90)
GFR calc non Af Amer: 78 mL/min — ABNORMAL LOW (ref >90)
Glucose, Bld: 100 mg/dL — ABNORMAL HIGH (ref 70–99)
POTASSIUM: 4 mmol/L (ref 3.5–5.1)
Sodium: 142 mmol/L (ref 135–145)

## 2014-05-26 ENCOUNTER — Encounter (HOSPITAL_COMMUNITY): Payer: Self-pay | Admitting: Cardiology

## 2014-05-26 ENCOUNTER — Ambulatory Visit (HOSPITAL_COMMUNITY)
Admission: RE | Admit: 2014-05-26 | Discharge: 2014-05-26 | Disposition: A | Discharge status: Home / Self Care | Payer: Self-pay | Source: Ambulatory Visit | Attending: Cardiology | Admitting: Cardiology

## 2014-05-26 ENCOUNTER — Encounter (HOSPITAL_COMMUNITY)
Admission: RE | Disposition: A | Discharge status: Home / Self Care | Payer: Self-pay | Source: Ambulatory Visit | Attending: Cardiology

## 2014-05-26 DIAGNOSIS — Z7982 Long term (current) use of aspirin: Secondary | ICD-10-CM | POA: Insufficient documentation

## 2014-05-26 DIAGNOSIS — Z87442 Personal history of urinary calculi: Secondary | ICD-10-CM | POA: Insufficient documentation

## 2014-05-26 DIAGNOSIS — E785 Hyperlipidemia, unspecified: Secondary | ICD-10-CM | POA: Insufficient documentation

## 2014-05-26 DIAGNOSIS — K219 Gastro-esophageal reflux disease without esophagitis: Secondary | ICD-10-CM | POA: Insufficient documentation

## 2014-05-26 DIAGNOSIS — I10 Essential (primary) hypertension: Secondary | ICD-10-CM | POA: Insufficient documentation

## 2014-05-26 DIAGNOSIS — I429 Cardiomyopathy, unspecified: Secondary | ICD-10-CM | POA: Insufficient documentation

## 2014-05-26 DIAGNOSIS — I5022 Chronic systolic (congestive) heart failure: Secondary | ICD-10-CM | POA: Insufficient documentation

## 2014-05-26 HISTORY — PX: LEFT HEART CATHETERIZATION WITH CORONARY ANGIOGRAM: SHX5451

## 2014-05-26 LAB — CBC
HCT: 41.2 % (ref 39.0–52.0)
HEMOGLOBIN: 13.9 g/dL (ref 13.0–17.0)
MCH: 28.1 pg (ref 26.0–34.0)
MCHC: 33.7 g/dL (ref 30.0–36.0)
MCV: 83.4 fL (ref 78.0–100.0)
PLATELETS: 287 10*3/uL (ref 150–400)
RBC: 4.94 MIL/uL (ref 4.22–5.81)
RDW: 14.3 % (ref 11.5–15.5)
WBC: 9.4 10*3/uL (ref 4.0–10.5)

## 2014-05-26 LAB — PROTIME-INR
INR: 1.06 (ref 0.00–1.49)
Prothrombin Time: 14 seconds (ref 11.6–15.2)

## 2014-05-26 SURGERY — LEFT HEART CATHETERIZATION WITH CORONARY ANGIOGRAM

## 2014-05-26 MED ORDER — HEPARIN (PORCINE) IN NACL 2-0.9 UNIT/ML-% IJ SOLN
INTRAMUSCULAR | Status: AC
  Filled 2014-05-26: qty 1000

## 2014-05-26 MED ORDER — MIDAZOLAM HCL 2 MG/2ML IJ SOLN
INTRAMUSCULAR | Status: AC
  Filled 2014-05-26: qty 2

## 2014-05-26 MED ORDER — ASPIRIN 81 MG PO CHEW
CHEWABLE_TABLET | ORAL | Status: AC
  Filled 2014-05-26: qty 4

## 2014-05-26 MED ORDER — ACETAMINOPHEN 325 MG PO TABS: 650.0000 mg | ORAL_TABLET | ORAL | Status: DC | PRN

## 2014-05-26 MED ORDER — SODIUM CHLORIDE 0.9 % IJ SOLN: 3.0000 mL | Freq: Two times a day (BID) | INTRAMUSCULAR | Status: DC

## 2014-05-26 MED ORDER — SODIUM CHLORIDE 0.9 % IV SOLN
INTRAVENOUS | Status: DC
Start: 2014-05-27 — End: 2014-05-26
  Administered 2014-05-26: 1000 mL via INTRAVENOUS

## 2014-05-26 MED ORDER — HYDRALAZINE HCL 20 MG/ML IJ SOLN
10.0000 mg | Freq: Once | INTRAMUSCULAR | Status: AC
  Administered 2014-05-26: 10 mg via INTRAVENOUS

## 2014-05-26 MED ORDER — SODIUM CHLORIDE 0.9 % IV SOLN
INTRAVENOUS | Status: AC
Start: 2014-05-26 — End: 2014-05-26

## 2014-05-26 MED ORDER — HYDRALAZINE HCL 20 MG/ML IJ SOLN
INTRAMUSCULAR | Status: AC
  Filled 2014-05-26: qty 1

## 2014-05-26 MED ORDER — ASPIRIN 81 MG PO CHEW
324.0000 mg | CHEWABLE_TABLET | ORAL | Status: AC
  Administered 2014-05-26: 324 mg via ORAL

## 2014-05-26 MED ORDER — ONDANSETRON HCL 4 MG/2ML IJ SOLN: 4.0000 mg | Freq: Four times a day (QID) | INTRAMUSCULAR | Status: DC | PRN

## 2014-05-26 MED ORDER — FENTANYL CITRATE 0.05 MG/ML IJ SOLN
INTRAMUSCULAR | Status: AC
  Filled 2014-05-26: qty 2

## 2014-05-26 MED ORDER — LIDOCAINE HCL (PF) 1 % IJ SOLN
INTRAMUSCULAR | Status: AC
  Filled 2014-05-26: qty 30

## 2014-05-26 MED ORDER — SODIUM CHLORIDE 0.9 % IJ SOLN: 3.0000 mL | INTRAMUSCULAR | Status: DC | PRN

## 2014-05-26 MED ORDER — VERAPAMIL HCL 2.5 MG/ML IV SOLN
INTRAVENOUS | Status: AC
  Filled 2014-05-26: qty 2

## 2014-05-26 MED ORDER — MIDAZOLAM HCL 2 MG/2ML IJ SOLN
INTRAMUSCULAR | Status: AC
Start: 2014-05-26 — End: 2014-05-26
  Filled 2014-05-26: qty 2

## 2014-05-26 MED ORDER — NITROGLYCERIN 1 MG/10 ML FOR IR/CATH LAB
INTRA_ARTERIAL | Status: AC
  Filled 2014-05-26: qty 10

## 2014-05-26 MED ORDER — SODIUM CHLORIDE 0.9 % IV SOLN: 250.0000 mL | INTRAVENOUS | Status: DC | PRN

## 2014-05-26 NOTE — Progress Notes (Signed)
Phillip [name] interpreter thru language resources here to interpret for patient

## 2014-05-26 NOTE — CV Procedure (Addendum)
   Cardiac Catheterization Procedure Note  Name: CASSIEL SHARPLEY MRN: [id number] DOB: June 09, 1965  Procedure: Left Heart Cath, Selective Coronary Angiography, LV angiography  Indication: Cardiomyopathy of uncertain etiology.    Procedural details: I was unable to access the right radial artery even with ultrasound use.  The artery was very small and though it was eventually entered a wire could not be passed.  There was prominent scar tissue in his wrist. The right groin was prepped, draped, and anesthetized with 1% lidocaine. Using modified Seldinger technique, a 5 French sheath was introduced into the right femoral artery. Standard Judkins catheters were used for coronary angiography and left ventriculography. Catheter exchanges were performed over a guidewire. There were no immediate procedural complications. The patient was transferred to the post catheterization recovery area for further monitoring.  Procedural Findings: Hemodynamics:  AO 133/82 LV 136/26   Coronary angiography: Coronary dominance: right  Left mainstem: Short, no significant coronary disease.   Left anterior descending (LAD): Mild luminal irregularities.   Left circumflex (LCx): Large system with no significant disease.   Right coronary artery (RCA): No significant coronary disease.   Left ventriculography: EF 35-40%, diffuse hypokinesis.  No significant mitral regurgitation.  Final Conclusions:  No significant coronary disease.  Nonischemic cardiomyopathy.  Elevated LV EDP, will increase Lasix to 40 mg daily and add KCl 20 mEq daily.  I will arrange for cardiac MRI to evaluate for infiltrative disease.    Marca Ancona 05/26/2014, 8:47 AM

## 2014-05-26 NOTE — Discharge Instructions (Signed)
Return To Work _[name]___ was treated at our facility. INJURY OR ILLNESS WAS: _____ Work-related __X_ Not work-related _____ Undetermined if work-related RETURN TO WORK  Employee may return to unrestricted work on: March 17, 2016____  Employee may return to modified work on: __Monday March 7, 2016__ WORK ACTIVITY RESTRICTIONS Work activities not tolerated include: _____ Bending _____ Prolonged sitting __X___ Lifting of greater than 15 pounds _____ Squatting _____ Prolonged standing _____ [name] _____ Reaching __X___ Pushing and pulling of greater than 15 pounds _____ Walking _____ Other ____________________ Show this Return to Work statement to Proofreader at work as soon as possible. Your employer should be aware of your condition and can help with the necessary work activity restrictions. If you wish to return to work sooner than the date above, or if you have further problems which make it difficult for you to return at that time, please call us or your caregiver. __Dr. Freida Busman Mc[place]____ Physician Name (Printed) _________________________________________ Physician Signature  _____03/03/2016____ Date Document Released: 03/11/2005 Document Revised: 06/03/2011 Document Reviewed: 08/26/2006 [facility] Patient Information 2015 [place], [place]. This information is not intended to replace advice given to you by your health care provider. Make sure you discuss any questions you have with your health care provider.

## 2014-05-26 NOTE — Progress Notes (Signed)
Interpreter Phillip [name] for procedure room # 3

## 2014-05-26 NOTE — Interval H&P Note (Signed)
Cath Lab Visit (complete for each Cath Lab visit)  Clinical Evaluation Leading to the Procedure:   ACS: No.  Non-ACS:    Anginal Classification: CCS III  Anti-ischemic medical therapy: Minimal Therapy (1 class of medications)  Non-Invasive Test Results: No non-invasive testing performed  Prior CABG: No previous CABG      History and Physical Interval Note:  05/26/2014 7:43 AM  Phillip [name]  has presented today for surgery, with the diagnosis of hf  The various methods of treatment have been discussed with the patient and family. After consideration of risks, benefits and other options for treatment, the patient has consented to  Procedure(s): LEFT HEART CATHETERIZATION WITH CORONARY ANGIOGRAM (N/A) as a surgical intervention .  The patient's history has been reviewed, patient examined, no change in status, stable for surgery.  I have reviewed the patient's chart and labs.  Questions were answered to the patient's satisfaction.     Phillip [name] [employer]

## 2014-05-26 NOTE — H&P (View-Only) (Signed)
Patient ID: Phillip [name], male   DOB: 1966/02/05, 49 y.o.   MRN: [id number] PCP: Dr Burtis Junes  49 yo with history of HTN and cardiomyopathy presents for cardiology evaluation.  He has been hypertensive since age 19.  He is from [place] and moved here about 3 years ago.  He does not speak [place].  In 2014, he apparently had chest pain and had a stress PET at [facility].  Cardiac cath at that time did not show obstructive disease.  He had an echo here in 4/15 that showed normal EF, 55-60%.  In 2/16, he was admitted twice with chest pain.  Both times, symptoms were thought to be atypical for ischemia. During the first admission, there was one troponin of 0.19 while others were normal.  During the second admission, there was one troponin to 0.05 while the others were normal.  Echo was done in 2/16, showing EF down to 30-35% with inferior and inferoseptal wall motion abnormality.  He did not have an ischemic workup.  Of note, he had run out of his BP meds in 12/15.  Currently, he tells me (via interpreter) that he has not had any further chest pain since leaving the hospital.  He has exertional dyspnea that has gradually worsened over a number of months.  He is short of breath ("feels choked") with heavy exertion and also with climbing a flight of steps.  He sleeps on 2 pillows.  He has occasional episodes that might be consistent with PND.  No lightheadedness or syncope.  No tachypalpitations.    ECG: NSR, lateral/anterolateral T wave inversions  Labs (2/16): HIV negative, K 3.6, creatinine 1.09, hemoglobin 13.3  PMH: 1. HTN: Diagnosed at age 19. 2. GERD 3. Hyperlipidemia 4. Nephrolithiasis 5. Cardiomyopathy: 2014 abnormal stress PET at [facility], had catheterization that per report showed no significant coronary disease.  Echo (4/15) with EF 55-60%.  Echo (2/16) with EF 30-35%, akinesis of the basal to mid inferior and inferoseptal walls, apex poorly visualized, restrictive diastolic function, mild MR.  HIV  negative in 2/16.   SH: Married, from [place], speaks only [place], works in [employer], nonsmoker, occasional ETOH.   FH: No h/o cardiomyopathy or CAD that he knows of.  Mother and sister with HTN.  ROS: All systems reviewed and negative except as per HPI.   Current Outpatient Prescriptions  Medication Sig Dispense Refill  . carvedilol (COREG) 25 MG tablet Take 1 tablet (25 mg total) by mouth 2 (two) times daily with a meal. 60 tablet 3  . enalapril (VASOTEC) 20 MG tablet Take 1 tablet (20 mg total) by mouth 2 (two) times daily. 60 tablet 3  . furosemide (LASIX) 20 MG tablet Take 1 tablet (20 mg total) by mouth daily. 30 tablet 3  . lovastatin (MEVACOR) 20 MG tablet Take 1 tablet (20 mg total) by mouth at bedtime. 30 tablet 3  . nitroGLYCERIN (NITROSTAT) 0.4 MG SL tablet Place 1 tablet (0.4 mg total) under the tongue every 5 (five) minutes as needed for chest pain (CP or SOB). 30 tablet 3  . prednisoLONE acetate (PRED FORTE) 1 % ophthalmic suspension Place 1 drop into the right eye 3 (three) times daily. 5 mL 0  . ranitidine (ZANTAC) 150 MG tablet Take 1 tablet (150 mg total) by mouth at bedtime. 30 tablet 3  . aspirin EC 81 MG tablet Take 1 tablet (81 mg total) by mouth daily. (Patient not taking: Reported on 03/19/2014) 30 tablet 3  . spironolactone (ALDACTONE) 25 MG  tablet Take 0.5 tablets (12.5 mg total) by mouth daily. 90 tablet 3   No current facility-administered medications for this encounter.   BP 140/82 mmHg  Pulse 68  Wt 229 lb (103.874 kg)  SpO2 95%' General: NAD Neck: No JVD, no thyromegaly or thyroid nodule.  Lungs: Clear to auscultation bilaterally with normal respiratory effort. CV: Nondisplaced PMI.  Heart regular S1/S2, no S3/S4, no murmur.  No peripheral edema.  No carotid bruit.  Normal pedal pulses.  Abdomen: Soft, nontender, no hepatosplenomegaly, no distention.  Skin: Intact without lesions or rashes.  Neurologic: Alert and oriented x 3.  Psych: Normal  affect. Extremities: No clubbing or cyanosis.  HEENT: Normal.   Assessment/Plan: 1. Chronic systolic CHF: Echo (2/16) with EF 30-35%, down from 55-60% about a year ago.  There are inferior and inferoseptal regional wall motion abnormalities. Etiology of cardiomyopathy is uncertain. It could be HTN as he had been off his meds.  However, with recent drop in EF, chest pain, regional wall motion abnormalities, and mild troponin rise on labs in the hospital in 2/16, I think that he merits an ischemic workup.  Another consideration, given his country of origin, would be Chagas cardiomyopathy.  He has NYHA class II-III symptoms but does not appear significantly volume overloaded on exam.  - I will arrange for coronary angiography to definitively diagnose CAD as a cause of his cardiomyopathy . - If no significant coronary disease, would be reasonable to do cardiac MRI to assess for Chagas involvement (can see late gadolinium enhancement, LV and RV aneurysms - these were not seen on echo but echo was a somewhat limited study).  He has not had any of the arrhythmias that typically go along with Chagas cardiomyopathy.  - Continue current Lasix, Coreg, and enalapril.  - Add spironolactone 12.5 mg daily with BMET next week.  - He will continue ASA 81 and statin given concern for possible CAD. 2. HTN: BP borderline today.  As above, starting spironolactone.    Followup in 2 wks.   Phillip [name] 05/17/2014

## 2014-05-26 NOTE — Progress Notes (Signed)
Site area right groin a 5 french sheath was removed  Site Prior to Removal:  Level 0  Pressure Applied For 20 MINUTES    Minutes Beginning at 0900am  Manual:   Yes.    Patient Status During Pull:  stable  Post Pull Groin Site:  Level 0  Post Pull Instructions Given:  Yes.    Post Pull Pulses Present:  Yes.    Dressing Applied:  Yes.    Comments:  VS remain stable during sheath pull.      pt denies any discomfort at site per interpeter

## 2014-06-08 ENCOUNTER — Encounter: Payer: Self-pay | Admitting: Internal Medicine

## 2014-06-08 ENCOUNTER — Ambulatory Visit (INDEPENDENT_AMBULATORY_CARE_PROVIDER_SITE_OTHER): Payer: Self-pay | Admitting: Internal Medicine

## 2014-06-08 VITALS — BP 160/84 | HR 62 | Ht 69.0 in | Wt 227.0 lb

## 2014-06-08 DIAGNOSIS — I420 Dilated cardiomyopathy: Secondary | ICD-10-CM

## 2014-06-08 DIAGNOSIS — I509 Heart failure, unspecified: Secondary | ICD-10-CM

## 2014-06-08 DIAGNOSIS — R0789 Other chest pain: Secondary | ICD-10-CM

## 2014-06-08 DIAGNOSIS — I10 Essential (primary) hypertension: Secondary | ICD-10-CM

## 2014-06-08 DIAGNOSIS — R9439 Abnormal result of other cardiovascular function study: Secondary | ICD-10-CM

## 2014-06-08 DIAGNOSIS — Z79899 Other long term (current) drug therapy: Secondary | ICD-10-CM

## 2014-06-08 MED ORDER — DIAZEPAM 5 MG PO TABS: 5.0000 mg | ORAL_TABLET | ORAL | Status: DC

## 2014-06-08 NOTE — Patient Instructions (Signed)
Dr. Rennis Golden has ordered a Cardiac MRI - done at [facility]  >> please schedule on a Monday March 21st >> Dr. Rennis Golden has prescribed valium to take 1 hour prior to test  You will need lab work prior  There is a lab on 1st floor of this building - [address]   Please keep your follow up with Dr. Shirlee Latch on 3/23

## 2014-06-09 ENCOUNTER — Ambulatory Visit (HOSPITAL_COMMUNITY)
Admission: RE | Admit: 2014-06-09 | Discharge: 2014-06-09 | Disposition: A | Discharge status: Home / Self Care | Payer: Self-pay | Source: Ambulatory Visit | Attending: Internal Medicine | Admitting: Internal Medicine

## 2014-06-09 DIAGNOSIS — I420 Dilated cardiomyopathy: Secondary | ICD-10-CM

## 2014-06-09 DIAGNOSIS — I509 Heart failure, unspecified: Secondary | ICD-10-CM

## 2014-06-09 LAB — CREATININE, SERUM: CREATININE: 0.95 mg/dL (ref 0.50–1.35)

## 2014-06-09 LAB — BUN+CREAT: BUN/Creatinine Ratio: 18.9 Ratio

## 2014-06-09 LAB — BUN: BUN: 18 mg/dL (ref 6–23)

## 2014-06-10 NOTE — Progress Notes (Signed)
OFFICE NOTE  Chief Complaint:  Hospital follow-up (Spanish telephone translator used for the visit)  Primary Care Physician: Christen Bame, MD  HPI:  Phillip [name] is a pleasant 49 year old male who had first seen about a year ago in the hospital. He was complaining of chest pain and had recently undergone cardiac catheterization at [facility] and was found to have no significant coronary disease that time. Subsequently he had presented again with chest pain symptoms and an echocardiogram revealed a cardiomyopathy. He then underwent cardiac catheterization recently at [facility] which demonstrated no significant obstructive coronary disease however his EF was about 30-35%. The idea of possible Chagas disease was presented based on the fact that he's from [place]. A cardiac MRI was recommended but never obtained. He is scheduled a ready for follow-up in the congestive heart failure clinic. Currently denies any worsening shortness of breath. He also reports no further chest pain.  PMHx:  Past Medical History  Diagnosis Date  . CHF (congestive heart failure)   . Hypertension   . Chronic back pain     "neck down" (07/20/2013)  . GERD (gastroesophageal reflux disease)   . High cholesterol   . Cardiac insufficiency   . Renal calculus     Past Surgical History  Procedure Laterality Date  . Eye surgery Right   . Corneal transplant Right ~ 2010  . Cataract extraction w/ intraocular lens implant Right 02/16/2014  . Cardiac catheterization  2014?  [name] Left heart catheterization with coronary angiogram N/A 05/26/2014    Procedure: LEFT HEART CATHETERIZATION WITH CORONARY ANGIOGRAM;  Surgeon: Laurey Morale, MD;  Location: [facility] CATH LAB;  Service: Cardiovascular;  Laterality: N/A;    FAMHx:  No family history on file.  SOCHx:   reports that he has never smoked. He has never used smokeless tobacco. He reports that he does not drink alcohol or use illicit drugs.  ALLERGIES:  No  Known Allergies  ROS: A comprehensive review of systems was negative.  HOME MEDS: Current Outpatient Prescriptions  Medication Sig Dispense Refill  . aspirin EC 81 MG tablet Take 1 tablet (81 mg total) by mouth daily. 30 tablet 3  . carvedilol (COREG) 25 MG tablet Take 1 tablet (25 mg total) by mouth 2 (two) times daily with a meal. 60 tablet 3  . enalapril (VASOTEC) 20 MG tablet Take 1 tablet (20 mg total) by mouth 2 (two) times daily. 60 tablet 3  . furosemide (LASIX) 20 MG tablet Take 1 tablet (20 mg total) by mouth daily. 30 tablet 3  . lovastatin (MEVACOR) 20 MG tablet Take 1 tablet (20 mg total) by mouth at bedtime. 30 tablet 3  . nitroGLYCERIN (NITROSTAT) 0.4 MG SL tablet Place 1 tablet (0.4 mg total) under the tongue every 5 (five) minutes as needed for chest pain (CP or SOB). 30 tablet 3  . ranitidine (ZANTAC) 150 MG tablet Take 1 tablet (150 mg total) by mouth at bedtime. 30 tablet 3  . diazepam (VALIUM) 5 MG tablet Take 1 tablet (5 mg total) by mouth as directed. Take 1 hour prior to test. 1 tablet 0   No current facility-administered medications for this visit.    LABS/IMAGING: No results found for this or any previous visit (from the past 48 hour(s)). No results found.  VITALS: BP 160/84 mmHg  Pulse 62  Ht 5\' 9"  (1.753 m)  Wt 227 lb (102.967 kg)  BMI 33.51 kg/m2  EXAM: General appearance: alert and no  distress Neck: no carotid bruit and no JVD Lungs: clear to auscultation bilaterally Heart: regular rate and rhythm Abdomen: soft, non-tender; bowel sounds normal; no masses,  no organomegaly Extremities: extremities normal, atraumatic, no cyanosis or edema Pulses: 2+ and symmetric Skin: Skin color, texture, turgor normal. No rashes or lesions Neurologic: Grossly normal Psych: Pleasant  EKG: Deferred  ASSESSMENT: 1. Nonischemic cardiomyopathy, LVEF 30-35%, NYHA class II symptoms 2. Dyslipidemia 3. GERD  PLAN: 1.   Phillip [name] recently had the  development of a cardiomyopathy which is nonischemic. The most likely etiology is hypertensive as he's had high blood pressure for 20 years. This does not seem to have been adequately controlled. Another less likely possibility is an infiltrative cardiomyopathy or perhaps Chagas disease. A cardiac MRI was recommended which I think is reasonable prior to considering the need for an implantable defibrillator at some point. I discussed the case with Dr. Shirlee Latch, who will be seeing him in the heart failure clinic. I will go ahead and order a cardiac MRI the hospital. Further follow-up will be with Dr. Shirlee Latch in the heart failure clinic.  Total visit time including using the Spanish translator was 25 minutes.  [name], MD, [facility] Attending Cardiologist [facility]  Hser Belanger C 06/10/2014, 6:02 PM

## 2014-06-13 ENCOUNTER — Encounter: Payer: Self-pay | Admitting: *Deleted

## 2014-06-13 ENCOUNTER — Telehealth: Payer: Self-pay | Admitting: *Deleted

## 2014-06-13 ENCOUNTER — Telehealth: Payer: Self-pay | Admitting: Cardiology

## 2014-06-13 NOTE — Telephone Encounter (Signed)
Walk In pt Form " RTW Note" gave to [employer]

## 2014-06-13 NOTE — Telephone Encounter (Signed)
Pt walked into office requesting return to work note for today.  Had note to return to work on June 09, 2014 but was not feeling well at that time so did not return to work.  Through translator pt reports he is now feeling well and would like to return to work today with no restrictions.  Reviewed with Dr. Rennis Golden and OK to write new letter.  New return to work letter written and given to pt. Letter read to pt by translator.

## 2014-06-15 ENCOUNTER — Ambulatory Visit (HOSPITAL_COMMUNITY)
Admission: RE | Admit: 2014-06-15 | Discharge: 2014-06-15 | Disposition: A | Discharge status: Home / Self Care | Payer: MEDICAID | Source: Ambulatory Visit | Attending: Adult Health | Admitting: Adult Health

## 2014-06-15 VITALS — BP 174/122 | HR 74 | Wt 228.8 lb

## 2014-06-15 DIAGNOSIS — I5022 Chronic systolic (congestive) heart failure: Secondary | ICD-10-CM

## 2014-06-15 DIAGNOSIS — I429 Cardiomyopathy, unspecified: Secondary | ICD-10-CM | POA: Insufficient documentation

## 2014-06-15 DIAGNOSIS — Z79899 Other long term (current) drug therapy: Secondary | ICD-10-CM | POA: Insufficient documentation

## 2014-06-15 DIAGNOSIS — I428 Other cardiomyopathies: Secondary | ICD-10-CM | POA: Insufficient documentation

## 2014-06-15 DIAGNOSIS — I10 Essential (primary) hypertension: Secondary | ICD-10-CM | POA: Insufficient documentation

## 2014-06-15 DIAGNOSIS — K219 Gastro-esophageal reflux disease without esophagitis: Secondary | ICD-10-CM | POA: Insufficient documentation

## 2014-06-15 DIAGNOSIS — E785 Hyperlipidemia, unspecified: Secondary | ICD-10-CM | POA: Insufficient documentation

## 2014-06-15 DIAGNOSIS — Z7982 Long term (current) use of aspirin: Secondary | ICD-10-CM | POA: Insufficient documentation

## 2014-06-15 HISTORY — DX: Chronic systolic (congestive) heart failure: I50.22

## 2014-06-15 NOTE — Patient Instructions (Signed)
Your prescriptions for Spironolactone, Coreg, and Lasix has been sent to the [facility]  Labs needed in one week-BMET  Your physician recommends that you schedule a follow-up appointment in: 3 weeks  Do the following things EVERYDAY: 1) Weigh yourself in the morning before breakfast. Write it down and keep it in a log. 2) Take your medicines as prescribed 3) Eat low salt foods-Limit salt (sodium) to 2000 mg per day.  4) Stay as active as you can everyday 5) Limit all fluids for the day to less than 2 liters 6) \ Sus recetas para espironolactona , Coreg , y Lasix ha sido enviado a la [place] del cono de [place] para pacientes ambulatorios Los laboratorios necesitaban en una semana - BMET Su mdico recomienda que programe una cita de seguimiento en : 3 semanas Haga lo siguiente las cosas cotidianas : Pesarse por la maana antes del desayuno . Anotarla y guardarla en un registro . Tome sus medicamentos segn lo prescrito [place] sal sal alimentos -Limit baja ( de sodio ) a 2000 mg por da. Mantenerse tan activo como sea posible todos los [employer] lquidos para que el da a menos de 2 litros \

## 2014-06-15 NOTE — Progress Notes (Signed)
Patient ID: EBONY SYVERTSON, male   DOB: 1966-02-23, 49 y.o.   MRN: [id number] PCP: Dr Burtis Junes  49 yo with history of HTN and cardiomyopathy presents for cardiology evaluation.  He has been hypertensive since age 64.  He is from [place] and moved here about 3 years ago.  He does not speak [place].  In 2014, he apparently had chest pain and had a stress PET at [facility].  Cardiac cath at that time did not show obstructive disease.  He had an echo here in 4/15 that showed normal EF, 55-60%.  In 2/16, he was admitted twice with chest pain.  Both times, symptoms were thought to be atypical for ischemia. During the first admission, there was one troponin of 0.19 while others were normal.  During the second admission, there was one troponin to 0.05 while the others were normal.  Echo was done in 2/16, showing EF down to 30-35% with inferior and inferoseptal wall motion abnormality.  He underwent coronary angiography on 05/26/14 with normal coronaries, LVEF 35-40%  and LVEDP 26. Lasix was increased.   After cath cMRi was ordered to look for possible Chagas CM. But he could not complete due to severe claustrophobia even with valium.   Follow-up: Cleda Daub ordered at last visit but he could not afford. Has been out of carvedilol and lasix for about 10 days. Currently, he tells me (via interpreter) that he has not had any further chest pain. Denies dyspnea. Denies edema, orthopnea or PND. + ab distension. Trying to cut down on fluid and salt intake. Weight stable 221-225   ECG: NSR, lateral/anterolateral T wave inversions  Labs (2/16): HIV negative, K 3.6, creatinine 1.09, hemoglobin 13.3  PMH: 1. HTN: Diagnosed at age 22. 2. GERD 3. Hyperlipidemia 4. Nephrolithiasis 5. Cardiomyopathy: 2014 abnormal stress PET at [facility], had catheterization that per report showed no significant coronary disease.  Echo (4/15) with EF 55-60%.  Echo (2/16) with EF 30-35%, akinesis of the basal to mid inferior and inferoseptal walls, apex  poorly visualized, restrictive diastolic function, mild MR.  HIV negative in 2/16.   SH: Married, from [place], speaks only [place], works in [employer], nonsmoker, occasional ETOH.   FH: No h/o cardiomyopathy or CAD that he knows of.  Mother and sister with HTN.  ROS: All systems reviewed and negative except as per HPI.   Current Outpatient Prescriptions  Medication Sig Dispense Refill  . aspirin EC 81 MG tablet Take 1 tablet (81 mg total) by mouth daily. 30 tablet 3  . diazepam (VALIUM) 5 MG tablet Take 1 tablet (5 mg total) by mouth as directed. Take 1 hour prior to test. 1 tablet 0  . enalapril (VASOTEC) 20 MG tablet Take 1 tablet (20 mg total) by mouth 2 (two) times daily. 60 tablet 3  . lovastatin (MEVACOR) 20 MG tablet Take 1 tablet (20 mg total) by mouth at bedtime. 30 tablet 3  . nitroGLYCERIN (NITROSTAT) 0.4 MG SL tablet Place 1 tablet (0.4 mg total) under the tongue every 5 (five) minutes as needed for chest pain (CP or SOB). 30 tablet 3  . carvedilol (COREG) 25 MG tablet Take 1 tablet (25 mg total) by mouth 2 (two) times daily with a meal. (Patient not taking: Reported on 06/15/2014) 60 tablet 3  . furosemide (LASIX) 20 MG tablet Take 1 tablet (20 mg total) by mouth daily. (Patient not taking: Reported on 06/15/2014) 30 tablet 3  . ranitidine (ZANTAC) 150 MG tablet Take 1 tablet (150 mg total)  by mouth at bedtime. (Patient not taking: Reported on 06/15/2014) 30 tablet 3   No current facility-administered medications for this encounter.   BP 174/122 mmHg  Pulse 74  Wt 228 lb 12.8 oz (103.783 kg)  SpO2 98%' General: NAD Neck: No JVD, no thyromegaly or thyroid nodule.  Lungs: Clear to auscultation bilaterally with normal respiratory effort. CV: Nondisplaced PMI.  Heart regular S1/S2, no S3/S4, no murmur.  No peripheral edema.  No carotid bruit.  Normal pedal pulses.  Abdomen: Soft, nontender, no hepatosplenomegaly, no distention.  Skin: Intact without lesions or rashes.   Neurologic: Alert and oriented x 3.  Psych: Normal affect. Extremities: No clubbing or cyanosis.  HEENT: Normal.   Assessment/Plan: 1. Chronic systolic CHF: Echo (2/16) with EF 30-35%, down from 55-60% about a year ago.  Cath 05/26/14 normal coronaries EF 35-40%  Etiology of cardiomyopathy is uncertain. It could be HTNive vs. Chagas cardiomyopathy.  He has NYHA class II-III symptoms but does not appear significantly volume overloaded on exam.  - Unable to tolerate cardiac MRI to assess for Chagas involvement due to severe claustrophobia. My suspicion for Chagas is low. Suspect due to HTN.   - Resume current Lasix, Coreg, and enalapril.  - Add spironolactone 25 mg daily with BMET next week.  - If weight goes to 226 take extra lasix - Ideally would benefit from [facility] but cannot afford 2. HTN: BP remains very high. As above, starting spironolactone and resuming carvedilol and lasix. Will likely need amlodipine.  Followup in 2 wks.   [name]  MD  06/15/2014

## 2014-06-22 ENCOUNTER — Ambulatory Visit (HOSPITAL_COMMUNITY)
Admission: RE | Admit: 2014-06-22 | Discharge: 2014-06-22 | Disposition: A | Discharge status: Home / Self Care | Payer: MEDICAID | Source: Ambulatory Visit | Attending: Internal Medicine | Admitting: Internal Medicine

## 2014-06-22 DIAGNOSIS — I5022 Chronic systolic (congestive) heart failure: Secondary | ICD-10-CM

## 2014-06-22 LAB — BASIC METABOLIC PANEL
Anion gap: 12 (ref 5–15)
BUN: 29 mg/dL — ABNORMAL HIGH (ref 6–23)
CO2: 24 mmol/L (ref 19–32)
CREATININE: 1.07 mg/dL (ref 0.50–1.35)
Calcium: 9.5 mg/dL (ref 8.4–10.5)
Chloride: 105 mmol/L (ref 96–112)
GFR calc non Af Amer: 79 mL/min — ABNORMAL LOW (ref >90)
Glucose, Bld: 87 mg/dL (ref 70–99)
POTASSIUM: 3.8 mmol/L (ref 3.5–5.1)
Sodium: 141 mmol/L (ref 135–145)

## 2014-07-06 ENCOUNTER — Ambulatory Visit (HOSPITAL_COMMUNITY)
Admission: RE | Admit: 2014-07-06 | Discharge: 2014-07-06 | Disposition: A | Discharge status: Home / Self Care | Payer: Self-pay | Source: Ambulatory Visit | Attending: Internal Medicine | Admitting: Internal Medicine

## 2014-07-06 ENCOUNTER — Encounter (HOSPITAL_COMMUNITY): Payer: Self-pay

## 2014-07-06 VITALS — BP 134/80 | HR 63 | Wt 226.5 lb

## 2014-07-06 DIAGNOSIS — I159 Secondary hypertension, unspecified: Secondary | ICD-10-CM

## 2014-07-06 DIAGNOSIS — I5022 Chronic systolic (congestive) heart failure: Secondary | ICD-10-CM | POA: Insufficient documentation

## 2014-07-06 DIAGNOSIS — K219 Gastro-esophageal reflux disease without esophagitis: Secondary | ICD-10-CM | POA: Insufficient documentation

## 2014-07-06 DIAGNOSIS — E785 Hyperlipidemia, unspecified: Secondary | ICD-10-CM | POA: Insufficient documentation

## 2014-07-06 DIAGNOSIS — I429 Cardiomyopathy, unspecified: Secondary | ICD-10-CM | POA: Insufficient documentation

## 2014-07-06 DIAGNOSIS — I10 Essential (primary) hypertension: Secondary | ICD-10-CM | POA: Insufficient documentation

## 2014-07-06 DIAGNOSIS — Z79899 Other long term (current) drug therapy: Secondary | ICD-10-CM | POA: Insufficient documentation

## 2014-07-06 DIAGNOSIS — Z7982 Long term (current) use of aspirin: Secondary | ICD-10-CM | POA: Insufficient documentation

## 2014-07-06 DIAGNOSIS — Z8249 Family history of ischemic heart disease and other diseases of the circulatory system: Secondary | ICD-10-CM | POA: Insufficient documentation

## 2014-07-06 MED ORDER — SPIRONOLACTONE 25 MG PO TABS: 25.0000 mg | ORAL_TABLET | Freq: Every day | ORAL | Status: DC

## 2014-07-06 NOTE — Progress Notes (Signed)
Patient ID: Phillip [name], male   DOB: 02/24/1966, 49 y.o.   MRN: [id number] PCP: Dr Burtis Junes  49 yo with history of HTN and cardiomyopathy presents for cardiology evaluation.  He has been hypertensive since age 58.  He is from [place] and moved here about 3 years ago.  He does not speak [place].  In 2014, he apparently had chest pain and had a stress PET at [facility].  Cardiac cath at that time did not show obstructive disease.  He had an echo here in 4/15 that showed normal EF, 55-60%.  In 2/16, he was admitted twice with chest pain.  Both times, symptoms were thought to be atypical for ischemia. During the first admission, there was one troponin of 0.19 while others were normal.  During the second admission, there was one troponin to 0.05 while the others were normal.  Echo was done in 2/16, showing EF down to 30-35% with inferior and inferoseptal wall motion abnormality.  He underwent coronary angiography on 05/26/14 with normal coronaries, LVEF 35-40%  and LVEDP 26. Lasix was increased.   After cath cMRi was ordered to look for possible Chagas CM. But he could not complete due to severe claustrophobia even with valium.   Follow-up: Interpreter present, [employer]. Last visit lasix, bb, and ace re-started. Weight at home 220-224 pounds. Has mild  Dyspnea with exertion. Denies PND/orthopnea. Does not snore at night.  Taking all medications. Working full time nights. Following low salt diet and limiting fluid intake to < 2liters per day.    Labs (2/16): HIV negative, K 3.6, creatinine 1.09, hemoglobin 13.3 Labs (3/16/201): Creatinine 0.95 Labs (06/22/2014): K 3.8 Creatinine 1.07  PMH: 1. HTN: Diagnosed at age 49. 2. GERD 3. Hyperlipidemia 4. Nephrolithiasis 5. Cardiomyopathy: 2014 abnormal stress PET at [facility], had catheterization that per report showed no significant coronary disease.  Echo (4/15) with EF 55-60%.  Echo (2/16) with EF 30-35%, akinesis of the basal to mid inferior and inferoseptal walls,  apex poorly visualized, restrictive diastolic function, mild MR.  HIV negative in 2/16.   SH: Married, from [place], speaks only [place], works in [employer], nonsmoker, occasional ETOH.   FH: No h/o cardiomyopathy or CAD that he knows of.  Mother and sister with HTN.  ROS: All systems reviewed and negative except as per HPI.   Current Outpatient Prescriptions  Medication Sig Dispense Refill  . aspirin EC 81 MG tablet Take 1 tablet (81 mg total) by mouth daily. 30 tablet 3  . carvedilol (COREG) 25 MG tablet Take 1 tablet (25 mg total) by mouth 2 (two) times daily with a meal. (Patient not taking: Reported on 06/15/2014) 60 tablet 3  . diazepam (VALIUM) 5 MG tablet Take 1 tablet (5 mg total) by mouth as directed. Take 1 hour prior to test. 1 tablet 0  . enalapril (VASOTEC) 20 MG tablet Take 1 tablet (20 mg total) by mouth 2 (two) times daily. 60 tablet 3  . furosemide (LASIX) 20 MG tablet Take 1 tablet (20 mg total) by mouth daily. (Patient not taking: Reported on 06/15/2014) 30 tablet 3  . lovastatin (MEVACOR) 20 MG tablet Take 1 tablet (20 mg total) by mouth at bedtime. 30 tablet 3  . nitroGLYCERIN (NITROSTAT) 0.4 MG SL tablet Place 1 tablet (0.4 mg total) under the tongue every 5 (five) minutes as needed for chest pain (CP or SOB). 30 tablet 3  . ranitidine (ZANTAC) 150 MG tablet Take 1 tablet (150 mg total) by mouth at bedtime. (Patient  not taking: Reported on 06/15/2014) 30 tablet 3   No current facility-administered medications for this encounter.   BP 134/80 mmHg  Pulse 63  Wt 226 lb 8 oz (102.74 kg)  SpO2 98%' General: NAD Neck: No JVD, no thyromegaly or thyroid nodule.  Lungs: Clear to auscultation bilaterally with normal respiratory effort. CV: Nondisplaced PMI.  Heart regular S1/S2, no S3/S4, no murmur.  No peripheral edema.  No carotid bruit.  Normal pedal pulses.  Abdomen: Soft, nontender, no hepatosplenomegaly, no distention.  Skin: Intact without lesions or rashes.   Neurologic: Alert and oriented x 3.  Psych: Normal affect. Extremities: No clubbing or cyanosis.  HEENT: Normal.   Assessment/Plan: 1. Chronic systolic CHF: Echo (2/16) with EF 30-35%, down from 55-60% about a year ago.  Cath 05/26/14 normal coronaries EF 35-40%  Etiology of cardiomyopathy is uncertain. It could be HTN vs. Chagas cardiomyopathy.   He has NYHA class II-III symptoms.  Volume status stable. Continue lasix 20 mg daily with an extra 20 mg lasix for 3 pound weight gain in 24 hours.   Unable to tolerate cardiac MRI to assess for Chagas involvement due to severe claustrophobia.  Suspect due to HTN.   - On goal dose Coreg and enalapril.  - Add spironolactone 25 mg daily . Check BMET in 10 days.  -Has difficulty with paying for medications. HF meds provided through HF fund.  -Plan to repeat ECHO in a couple of months.   2. HTN: BP much improved on meds. Add 25 mg spiro daily.   Follow up in 10 days for BMET. Follow up in 4 weeks.    CLEGG,AMY  NP-C  07/06/2014

## 2014-07-06 NOTE — Patient Instructions (Signed)
START Spironolactone 25mg  (1 tablet) daily.  LABS in 10 days. (bmet)  FOLLOW UP in 4 weeks.

## 2014-07-07 ENCOUNTER — Encounter: Payer: Self-pay | Admitting: Internal Medicine

## 2014-07-13 ENCOUNTER — Ambulatory Visit (HOSPITAL_COMMUNITY)
Admission: RE | Admit: 2014-07-13 | Discharge: 2014-07-13 | Disposition: A | Discharge status: Home / Self Care | Payer: Self-pay | Source: Ambulatory Visit | Attending: Internal Medicine | Admitting: Internal Medicine

## 2014-07-13 DIAGNOSIS — I5022 Chronic systolic (congestive) heart failure: Secondary | ICD-10-CM | POA: Insufficient documentation

## 2014-07-13 LAB — BASIC METABOLIC PANEL
Anion gap: 8 (ref 5–15)
BUN: 25 mg/dL — AB (ref 6–23)
CO2: 26 mmol/L (ref 19–32)
Calcium: 9.3 mg/dL (ref 8.4–10.5)
Chloride: 107 mmol/L (ref 96–112)
Creatinine, Ser: 1.07 mg/dL (ref 0.50–1.35)
GFR calc non Af Amer: 79 mL/min — ABNORMAL LOW (ref >90)
GLUCOSE: 126 mg/dL — AB (ref 70–99)
POTASSIUM: 4.2 mmol/L (ref 3.5–5.1)
Sodium: 141 mmol/L (ref 135–145)

## 2014-07-21 ENCOUNTER — Encounter: Payer: Self-pay | Admitting: Internal Medicine

## 2014-08-03 ENCOUNTER — Ambulatory Visit (HOSPITAL_COMMUNITY)
Admission: RE | Admit: 2014-08-03 | Discharge: 2014-08-03 | Disposition: A | Discharge status: Home / Self Care | Payer: Medicaid Other | Source: Ambulatory Visit | Attending: Cardiology | Admitting: Cardiology

## 2014-08-03 ENCOUNTER — Encounter (HOSPITAL_COMMUNITY): Payer: Self-pay

## 2014-08-03 VITALS — BP 142/100 | HR 84 | Wt 226.0 lb

## 2014-08-03 DIAGNOSIS — E785 Hyperlipidemia, unspecified: Secondary | ICD-10-CM | POA: Insufficient documentation

## 2014-08-03 DIAGNOSIS — I159 Secondary hypertension, unspecified: Secondary | ICD-10-CM | POA: Diagnosis not present

## 2014-08-03 DIAGNOSIS — I5022 Chronic systolic (congestive) heart failure: Secondary | ICD-10-CM | POA: Diagnosis not present

## 2014-08-03 DIAGNOSIS — I429 Cardiomyopathy, unspecified: Secondary | ICD-10-CM | POA: Insufficient documentation

## 2014-08-03 DIAGNOSIS — K219 Gastro-esophageal reflux disease without esophagitis: Secondary | ICD-10-CM | POA: Insufficient documentation

## 2014-08-03 DIAGNOSIS — Z79899 Other long term (current) drug therapy: Secondary | ICD-10-CM | POA: Insufficient documentation

## 2014-08-03 DIAGNOSIS — I10 Essential (primary) hypertension: Secondary | ICD-10-CM | POA: Insufficient documentation

## 2014-08-03 DIAGNOSIS — Z8249 Family history of ischemic heart disease and other diseases of the circulatory system: Secondary | ICD-10-CM | POA: Insufficient documentation

## 2014-08-03 MED ORDER — SPIRONOLACTONE 25 MG PO TABS: 25.0000 mg | ORAL_TABLET | Freq: Every day | ORAL | Status: DC

## 2014-08-03 MED ORDER — CARVEDILOL 25 MG PO TABS: 25.0000 mg | ORAL_TABLET | Freq: Two times a day (BID) | ORAL | Status: DC

## 2014-08-03 MED ORDER — FUROSEMIDE 20 MG PO TABS: 20.0000 mg | ORAL_TABLET | Freq: Every day | ORAL | Status: DC

## 2014-08-03 MED ORDER — ENALAPRIL MALEATE 20 MG PO TABS: 20.0000 mg | ORAL_TABLET | Freq: Two times a day (BID) | ORAL | Status: DC

## 2014-08-03 NOTE — Patient Instructions (Signed)
FOLLOW UP in 2 weeks with bmet. (NP clinic)

## 2014-08-03 NOTE — Progress Notes (Signed)
Patient ID: Phillip [name], male   DOB: Apr 26, 1965, 49 y.o.   MRN: [id number] PCP: Dr Burtis Junes  49 yo with history of HTN and cardiomyopathy presents for cardiology evaluation.  He has been hypertensive since age 69.  He is from [place] and moved here about 3 years ago.  He does not speak [place].  In 2014, he apparently had chest pain and had a stress PET at [facility].  Cardiac cath at that time did not show obstructive disease.  He had an echo here in 4/15 that showed normal EF, 55-60%.  In 2/16, he was admitted twice with chest pain.  Both times, symptoms were thought to be atypical for ischemia. During the first admission, there was one troponin of 0.19 while others were normal.  During the second admission, there was one troponin to 0.05 while the others were normal.  Echo was done in 2/16, showing EF down to 30-35% with inferior and inferoseptal wall motion abnormality.  He underwent coronary angiography on 05/26/14 with normal coronaries, LVEF 35-40%  and LVEDP 26. Lasix was increased.   After cath cMRi was ordered to look for possible Chagas CM. But he could not complete due to severe claustrophobia even with valium.   He returns for HF follow up -Interpreter present, [employer]. Last visit spiro added but has been out of medications for 10 days. Medicaid pending. Denies PND/orthopnea. Does not snore at night.  Taking all medications. Working full time nights. Following low salt diet and limiting fluid intake to < 2 liters per day.    Labs (2/16): HIV negative, K 3.6, creatinine 1.09, hemoglobin 13.3 Labs (3/16/201): Creatinine 0.95 Labs (06/22/2014): K 3.8 Creatinine 1.07 Labs 07/13/2014; K 4.2 Creatinine 1.07   PMH: 1. HTN: Diagnosed at age 37. 2. GERD 3. Hyperlipidemia 4. Nephrolithiasis 5. Cardiomyopathy: 2014 abnormal stress PET at [facility], had catheterization that per report showed no significant coronary disease.  Echo (4/15) with EF 55-60%.  Echo (2/16) with EF 30-35%, akinesis of the basal to mid  inferior and inferoseptal walls, apex poorly visualized, restrictive diastolic function, mild MR.  HIV negative in 2/16.   SH: Married, from [place], speaks only [place], works in [employer], nonsmoker, occasional ETOH.   FH: No h/o cardiomyopathy or CAD that he knows of.  Mother and sister with HTN.  ROS: All systems reviewed and negative except as per HPI.   Current Outpatient Prescriptions  Medication Sig Dispense Refill  . enalapril (VASOTEC) 20 MG tablet Take 1 tablet (20 mg total) by mouth 2 (two) times daily. 60 tablet 3  . carvedilol (COREG) 25 MG tablet Take 1 tablet (25 mg total) by mouth 2 (two) times daily with a meal. (Patient not taking: Reported on 08/03/2014) 60 tablet 3  . furosemide (LASIX) 20 MG tablet Take 1 tablet (20 mg total) by mouth daily. (Patient not taking: Reported on 08/03/2014) 30 tablet 3  . lovastatin (MEVACOR) 20 MG tablet Take 1 tablet (20 mg total) by mouth at bedtime. (Patient not taking: Reported on 08/03/2014) 30 tablet 3  . nitroGLYCERIN (NITROSTAT) 0.4 MG SL tablet Place 1 tablet (0.4 mg total) under the tongue every 5 (five) minutes as needed for chest pain (CP or SOB). (Patient not taking: Reported on 08/03/2014) 30 tablet 3  . ranitidine (ZANTAC) 150 MG tablet Take 1 tablet (150 mg total) by mouth at bedtime. (Patient not taking: Reported on 08/03/2014) 30 tablet 3  . spironolactone (ALDACTONE) 25 MG tablet Take 1 tablet (25 mg total) by mouth  daily. (Patient not taking: Reported on 08/03/2014) 30 tablet 3   No current facility-administered medications for this encounter.   BP 142/100 mmHg  Pulse 84  Wt 226 lb (102.513 kg)  SpO2 98%' General: NAD Neck: No JVD, no thyromegaly or thyroid nodule.  Lungs: Clear to auscultation bilaterally with normal respiratory effort. CV: Nondisplaced PMI.  Heart regular S1/S2, no S3/S4, no murmur.  No peripheral edema.  No carotid bruit.  Normal pedal pulses.  Abdomen: Soft, nontender, no hepatosplenomegaly, no  distention.  Skin: Intact without lesions or rashes.  Neurologic: Alert and oriented x 3.  Psych: Normal affect. Extremities: No clubbing or cyanosis.  HEENT: Normal.   Assessment/Plan: 1. Chronic systolic CHF: Echo (2/16) with EF 30-35%, down from 55-60% about a year ago.  Cath 05/26/14 normal coronaries EF 35-40%  Etiology of cardiomyopathy is uncertain. It could be HTN vs. Chagas cardiomyopathy.   He has NYHA class II-III symptoms. Today will restart HF meds as he has been out of them for 2 weeks. Volume status stable.  Reorder lasix, coreg, spiro, and enalapril.  Unable to tolerate cardiac MRI to assess for Chagas involvement due to severe claustrophobia.  Does not have insurance yet so hold off for now.  -Has difficulty with paying for medications. HF meds provided through HF fund.  -Plan to repeat ECHO in a couple of months after he has been back on his meds.   2. HTN: BP elevated but not surprising because he has been out of his meds for the last 2 weeks.  Follow up in 10 days for BMET. Follow up in 2 weeks.   CLEGG,AMY  NP-C  08/03/2014

## 2014-08-11 ENCOUNTER — Telehealth: Payer: Self-pay | Admitting: Internal Medicine

## 2014-08-11 NOTE — Telephone Encounter (Signed)
Call to patient to confirm appointment for 08/12/14 at 3:45 phone doesnt accept calls

## 2014-08-12 ENCOUNTER — Encounter: Payer: Medicaid Other | Admitting: Internal Medicine

## 2014-08-17 ENCOUNTER — Encounter (HOSPITAL_COMMUNITY): Payer: Self-pay

## 2014-08-17 ENCOUNTER — Ambulatory Visit (HOSPITAL_COMMUNITY)
Admission: RE | Admit: 2014-08-17 | Discharge: 2014-08-17 | Disposition: A | Discharge status: Home / Self Care | Payer: Medicaid Other | Source: Ambulatory Visit | Attending: Internal Medicine | Admitting: Internal Medicine

## 2014-08-17 VITALS — BP 140/96 | HR 66 | Wt 223.0 lb

## 2014-08-17 DIAGNOSIS — Z79899 Other long term (current) drug therapy: Secondary | ICD-10-CM | POA: Insufficient documentation

## 2014-08-17 DIAGNOSIS — Z87442 Personal history of urinary calculi: Secondary | ICD-10-CM | POA: Insufficient documentation

## 2014-08-17 DIAGNOSIS — E785 Hyperlipidemia, unspecified: Secondary | ICD-10-CM | POA: Insufficient documentation

## 2014-08-17 DIAGNOSIS — K219 Gastro-esophageal reflux disease without esophagitis: Secondary | ICD-10-CM | POA: Insufficient documentation

## 2014-08-17 DIAGNOSIS — I10 Essential (primary) hypertension: Secondary | ICD-10-CM | POA: Diagnosis not present

## 2014-08-17 DIAGNOSIS — I5022 Chronic systolic (congestive) heart failure: Secondary | ICD-10-CM | POA: Insufficient documentation

## 2014-08-17 MED ORDER — AMLODIPINE BESYLATE 5 MG PO TABS: 5.0000 mg | ORAL_TABLET | Freq: Every day | ORAL | Status: DC

## 2014-08-17 NOTE — Addendum Note (Signed)
Encounter addended by: Sherald Hess, NP on: 08/17/2014  4:12 PM<BR>     Documentation filed: Patient Instructions Section, Orders

## 2014-08-17 NOTE — Patient Instructions (Signed)
IgA hasta en 2 meses   tome amlodipine 5 mg   diario haga lo siguiente todos los das: pesarse en la maana antes del desayuno. Escribirlo y Science writer. Tome sus medicamentos como prescrito comer comidas bajas de sal, limitar la sal (sodio) a 2000 mg por [employer]. Mantngase tan activo como sea posible todos los [address]  [address] limitar todos los lquidos para Medical laboratory scientific officer a menos de 2 litros

## 2014-08-17 NOTE — Progress Notes (Signed)
Patient ID: Phillip [name], male   DOB: 07-31-1965, 49 y.o.   MRN: [id number] PCP: Dr Burtis Junes  49 yo with history of HTN and cardiomyopathy presents for cardiology evaluation.  He has been hypertensive since age 47.  He is from [place] and moved here about 3 years ago.  He does not speak [place].  In 2014, he apparently had chest pain and had a stress PET at [facility].  Cardiac cath at that time did not show obstructive disease.  He had an echo here in 4/15 that showed normal EF, 55-60%.  In 2/16, he was admitted twice with chest pain.  Both times, symptoms were thought to be atypical for ischemia. During the first admission, there was one troponin of 0.19 while others were normal.  During the second admission, there was one troponin to 0.05 while the others were normal.  Echo was done in 2/16, showing EF down to 30-35% with inferior and inferoseptal wall motion abnormality.  He underwent coronary angiography on 05/26/14 with normal coronaries, LVEF 35-40%  and LVEDP 26. Lasix was increased.   After cMRi was ordered to look for possible Chagas CM. But he could not complete due to severe claustrophobia even with valium.   He returns for HF follow up -Interpreter present, [employer]. Last visit he was out of his medicines for 2 weeks. Says there were no refills on his chart.  Meds restarted. Still pending medicaid. spiro added but has been out of medications for 10 days. Medicaid pending. Feels ok. Weight down 3 pounds. Occasional chest pressure when he is tired. Denies PND/orthopnea/edema. Working full time nights. Following low salt diet and limiting fluid intake to < 2 liters per day.    Labs (2/16): HIV negative, K 3.6, creatinine 1.09, hemoglobin 13.3 Labs (3/16/201): Creatinine 0.95 Labs (06/22/2014): K 3.8 Creatinine 1.07 Labs 07/13/2014; K 4.2 Creatinine 1.07   PMH: 1. HTN: Diagnosed at age 2. 2. GERD 3. Hyperlipidemia 4. Nephrolithiasis 5. Cardiomyopathy: 2014 abnormal stress PET at [facility], had  catheterization that per report showed no significant coronary disease.  Echo (4/15) with EF 55-60%.  Echo (2/16) with EF 30-35%, akinesis of the basal to mid inferior and inferoseptal walls, apex poorly visualized, restrictive diastolic function, mild MR.  HIV negative in 2/16.   SH: Married, from [place], speaks only [place], works in [employer], nonsmoker, occasional ETOH.   FH: No h/o cardiomyopathy or CAD that he knows of.  Mother and sister with HTN.  ROS: All systems reviewed and negative except as per HPI.   Current Outpatient Prescriptions  Medication Sig Dispense Refill  . carvedilol (COREG) 25 MG tablet Take 1 tablet (25 mg total) by mouth 2 (two) times daily with a meal. 60 tablet 3  . enalapril (VASOTEC) 20 MG tablet Take 1 tablet (20 mg total) by mouth 2 (two) times daily. 60 tablet 3  . furosemide (LASIX) 20 MG tablet Take 1 tablet (20 mg total) by mouth daily. 30 tablet 3  . nitroGLYCERIN (NITROSTAT) 0.4 MG SL tablet Place 1 tablet (0.4 mg total) under the tongue every 5 (five) minutes as needed for chest pain (CP or SOB). 30 tablet 3  . spironolactone (ALDACTONE) 25 MG tablet Take 1 tablet (25 mg total) by mouth daily. 30 tablet 3   No current facility-administered medications for this encounter.   BP 146/98 mmHg  Pulse 66  Wt 223 lb (101.152 kg)  SpO2 98%' General: NAD Neck: No JVD, no thyromegaly or thyroid nodule.  Lungs: Clear  to auscultation bilaterally with normal respiratory effort. CV: Nondisplaced PMI.  Heart regular S1/S2, no S3/S4, 2/6 SEM RSB  Tr edema.  No carotid bruit.  Normal pedal pulses.  Abdomen: obese.s oft, nontender, no hepatosplenomegaly, no distention.  Skin: Intact without lesions or rashes.  Neurologic: Alert and oriented x 3.  Psych: Normal affect. Extremities: No clubbing or cyanosis.  HEENT: Normal.   Assessment/Plan: 1. Chronic systolic CHF: Echo (2/16) with EF 30-35%, down from 55-60% about a year ago.  Cath 05/26/14 normal coronaries EF  35-40%  Etiology of cardiomyopathy is uncertain. Likely HTN (vs. Chagas cardiomyopathy).  --NYHA class II symptoms.  --Volume status ok  --Ideally would switch to [facility] but has difficulty with paying for medications. HF meds provided through HF fund. Once medicaid approved can make switch --Unable to tolerate cardiac MRI to assess for Chagas involvement due to severe claustrophobia.  Does not have insurance yet so hold off for now.  - F/u in 2 months with echo  2. HTN: BP elevated. Start amlodipine 5mg  daily    [name]  MD  08/17/2014

## 2014-08-20 ENCOUNTER — Emergency Department (HOSPITAL_COMMUNITY)
Admission: EM | Admit: 2014-08-20 | Discharge: 2014-08-21 | Disposition: A | Discharge status: Home / Self Care | Payer: Self-pay | Attending: Emergency Medicine | Admitting: Emergency Medicine

## 2014-08-20 DIAGNOSIS — R110 Nausea: Secondary | ICD-10-CM | POA: Insufficient documentation

## 2014-08-20 DIAGNOSIS — R51 Headache: Secondary | ICD-10-CM | POA: Insufficient documentation

## 2014-08-20 DIAGNOSIS — R200 Anesthesia of skin: Secondary | ICD-10-CM | POA: Insufficient documentation

## 2014-08-20 DIAGNOSIS — R079 Chest pain, unspecified: Secondary | ICD-10-CM | POA: Insufficient documentation

## 2014-08-20 DIAGNOSIS — R42 Dizziness and giddiness: Secondary | ICD-10-CM | POA: Insufficient documentation

## 2014-08-20 DIAGNOSIS — I10 Essential (primary) hypertension: Secondary | ICD-10-CM | POA: Insufficient documentation

## 2014-08-20 DIAGNOSIS — I509 Heart failure, unspecified: Secondary | ICD-10-CM | POA: Insufficient documentation

## 2014-08-20 DIAGNOSIS — R61 Generalized hyperhidrosis: Secondary | ICD-10-CM | POA: Insufficient documentation

## 2014-08-20 DIAGNOSIS — Z8719 Personal history of other diseases of the digestive system: Secondary | ICD-10-CM | POA: Insufficient documentation

## 2014-08-20 DIAGNOSIS — Z9889 Other specified postprocedural states: Secondary | ICD-10-CM | POA: Insufficient documentation

## 2014-08-20 DIAGNOSIS — Z87442 Personal history of urinary calculi: Secondary | ICD-10-CM | POA: Insufficient documentation

## 2014-08-20 DIAGNOSIS — G8929 Other chronic pain: Secondary | ICD-10-CM | POA: Insufficient documentation

## 2014-08-20 DIAGNOSIS — Z8639 Personal history of other endocrine, nutritional and metabolic disease: Secondary | ICD-10-CM | POA: Insufficient documentation

## 2014-08-20 DIAGNOSIS — Z79899 Other long term (current) drug therapy: Secondary | ICD-10-CM | POA: Insufficient documentation

## 2014-08-20 DIAGNOSIS — R0602 Shortness of breath: Secondary | ICD-10-CM | POA: Insufficient documentation

## 2014-08-21 ENCOUNTER — Emergency Department (HOSPITAL_COMMUNITY): Payer: Medicaid Other

## 2014-08-21 ENCOUNTER — Emergency Department (HOSPITAL_COMMUNITY): Payer: Self-pay

## 2014-08-21 ENCOUNTER — Encounter (HOSPITAL_COMMUNITY): Payer: Self-pay

## 2014-08-21 DIAGNOSIS — R42 Dizziness and giddiness: Secondary | ICD-10-CM

## 2014-08-21 LAB — CBC WITH DIFFERENTIAL/PLATELET
BASOS ABS: 0.1 10*3/uL (ref 0.0–0.1)
Basophils Relative: 1 % (ref 0–1)
Eosinophils Absolute: 0.3 10*3/uL (ref 0.0–0.7)
Eosinophils Relative: 3 % (ref 0–5)
HEMATOCRIT: 39.9 % (ref 39.0–52.0)
HEMOGLOBIN: 13.1 g/dL (ref 13.0–17.0)
Lymphocytes Relative: 30 % (ref 12–46)
Lymphs Abs: 2.9 10*3/uL (ref 0.7–4.0)
MCH: 27.9 pg (ref 26.0–34.0)
MCHC: 32.8 g/dL (ref 30.0–36.0)
MCV: 84.9 fL (ref 78.0–100.0)
MONO ABS: 0.7 10*3/uL (ref 0.1–1.0)
Monocytes Relative: 7 % (ref 3–12)
NEUTROS ABS: 5.7 10*3/uL (ref 1.7–7.7)
Neutrophils Relative %: 59 % (ref 43–77)
Platelets: 283 10*3/uL (ref 150–400)
RBC: 4.7 MIL/uL (ref 4.22–5.81)
RDW: 14.7 % (ref 11.5–15.5)
WBC: 9.6 10*3/uL (ref 4.0–10.5)

## 2014-08-21 LAB — I-STAT TROPONIN, ED
Troponin i, poc: 0.01 ng/mL (ref 0.00–0.08)
Troponin i, poc: 0 ng/mL (ref 0.00–0.08)

## 2014-08-21 LAB — BASIC METABOLIC PANEL
Anion gap: 7 (ref 5–15)
BUN: 25 mg/dL — AB (ref 6–20)
CO2: 26 mmol/L (ref 22–32)
Calcium: 8.9 mg/dL (ref 8.9–10.3)
Chloride: 105 mmol/L (ref 101–111)
Creatinine, Ser: 1.01 mg/dL (ref 0.61–1.24)
GFR calc Af Amer: >60 mL/min (ref >60)
Glucose, Bld: 101 mg/dL — ABNORMAL HIGH (ref 65–99)
Potassium: 4 mmol/L (ref 3.5–5.1)
SODIUM: 138 mmol/L (ref 135–145)

## 2014-08-21 LAB — BRAIN NATRIURETIC PEPTIDE: B Natriuretic Peptide: 11 pg/mL (ref 0.0–100.0)

## 2014-08-21 MED ORDER — MECLIZINE HCL 25 MG PO TABS
50.0000 mg | ORAL_TABLET | Freq: Once | ORAL | Status: AC
  Administered 2014-08-21: 50 mg via ORAL
  Filled 2014-08-21: qty 2

## 2014-08-21 MED ORDER — MECLIZINE HCL 25 MG PO TABS
25.0000 mg | ORAL_TABLET | Freq: Three times a day (TID) | ORAL | Status: DC | PRN
Start: 2014-08-21 — End: 2014-10-06

## 2014-08-21 NOTE — ED Notes (Signed)
Pt refusing to have MRI done, states he can't tolerate to be in close environment, that he was given IV med before for MRI and still didn't tolerated.

## 2014-08-21 NOTE — Discharge Instructions (Signed)
We saw you in the ER for THE CHEST PAIN AND DIZZINESS. All the results in the ER are normal, labs and imaging.  The workup in the ER is not complete, and is limited to screening for life threatening and emergent conditions only, so please see a primary care doctor for further evaluation.  FOR THE SPINNING sensation, see your primary doctor if not getting better with the medicine we prescribed. Take baby aspirin daily.   Dolor de pecho (no especfico) (Chest Pain (Nonspecific)) Con frecuencia es difcil dar un diagnstico especfico de la causa del dolor de [place]. Siempre hay una posibilidad de que el dolor podra estar relacionado con algo grave, como un ataque al corazn o un cogulo sanguneo en los pulmones. Debe someterse a controles con el mdico para ms evaluaciones. CAUSAS   Acidez.  Neumona o bronquitis.  Ansiedad o estrs.  Inflamacin de la zona que rodea al corazn (pericarditis) o a los pulmones (pleuritis o pleuresa).  Un cogulo sanguneo en el pulmn.  Colapso de un pulmn (neumotrax), que puede aparecer de [employer] repentina por s solo (neumotrax espontneo) o debido a un traumatismo en el trax.  Culebrilla (virus del herpes zster). La pared torcica est compuesta por huesos, msculos y TEFL teacher. Cualquiera de estos puede ser la fuente del dolor.  Puede haber una contusin en los huesos debido a una lesin.  Puede haber un esguince en los msculos o el cartlago ocasionado por la tos o por [place].  El cartlago puede verse afectado por una inflamacin y Psychologist, counselling (costocondritis). DIAGNSTICO  Gretchen Short se necesiten anlisis de laboratorio u otros estudios para Veterinary surgeon causa del Engineer, mining. Adems, puede indicarle que se haga una prueba llamada electrocadiograma (ECG) ambulatorio. El ECG registra los patrones de los latidos cardacos durante 24horas. Adems, pueden hacerle otros estudios, por ejemplo:  Ecocardiograma transtorcico (ETT).  Durante Management consultant, se usan ondas sonoras para evaluar el flujo de la sangre a travs del corazn.  Ecocardiograma transesofgico (ETE).  Monitoreo cardaco. Permite que el mdico controle la frecuencia y el ritmo cardaco en tiempo real.  Monitor Holter. Es un dispositivo porttil que [employer] latidos cardacos y [place] a Education administrator las arritmias cardacas. Le permite al [employer] registrar la actividad cardaca durante varios das, si es necesario.  Pruebas de estrs por ejercicio o por medicamentos que aceleran los latidos cardacos. TRATAMIENTO   El tratamiento depende de la causa del dolor de [place]. El tratamiento puede incluir:  Inhibidores de la acidez estomacal.  Antiinflamatorios.  Analgsicos para las enfermedades inflamatorias.  Antibiticos, si hay una infeccin.  Podrn aconsejarle que modifique su estilo de vida. Esto incluye dejar de fumar y evitar el alcohol, la cafena y el chocolate.  Pueden aconsejarle que mantenga la cabeza levantada (elevada) cuando duerme. Esto reduce la probabilidad de que el cido retroceda del estmago al esfago. En la [employer], el dolor de pecho no especfico mejorar en el trmino de 2 a 3das, con reposo y [employer].  INSTRUCCIONES PARA EL CUIDADO EN EL HOGAR   Si le prescriben antibiticos, tmelos tal como se le indic. Termnelos aunque comience a sentirse mejor.  [address], no haga actividades fsicas que provoquen dolor de [place]. Contine con las actividades fsicas tal como se le indic  No consuma ningn producto que contenga tabaco, incluidos cigarrillos, tabaco de Theatre manager o cigarrillos electrnicos.  Evite el consumo de alcohol.  Tome los medicamentos solamente como se lo haya indicado el mdico.  El [place]  las sugerencias del mdico en lo que respecta a las pruebas adicionales, si el dolor de pecho no desaparece.  Concurra a todas las visitas de control programadas. Si no lo hace,  podra desarrollar problemas permanentes (crnicos) relacionados con el dolor. Si hay algn problema para concurrir a una cita, llame para reprogramarla. SOLICITE ATENCIN MDICA SI:   El dolor de pecho no desaparece, incluso despus del tratamiento.  Tiene una erupcin cutnea con ampollas en el pecho.  Tiene fiebre. SOLICITE ATENCIN MDICA DE [employer] SI:   Aumenta el dolor de pecho o este se irradia hacia el brazo, el cuello, la [place], la espalda o el abdomen.  Le falta el aire.  La tos empeora, o expectora sangre.  Siente dolor intenso en la espalda o el abdomen.  Se siente nauseoso o vomita.  Siente debilidad intensa.  Se desmaya.  Tiene escalofros. Esto es Radio broadcast assistant. No espere a ver si el dolor se pasa. Obtenga ayuda mdica de inmediato. Llame a los servicios de emergencia locales (911 en los [place]). No conduzca por sus propios medios [employer] hospital. ASEGRESE DE QUE:   Comprende estas instrucciones.  Controlar su afeccin.  Recibir ayuda de inmediato si no mejora o si empeora. Document Released: 03/11/2005 Document Revised: 03/16/2013 [facility] Patient Information 2015 [place], [place]. This information is not intended to replace advice given to you by your health care provider. Make sure you discuss any questions you have with your health care provider. Vrtigo (Vertigo)  Vrtigo es la sensacin de que se est moviendo estando quieto. Puede ser peligroso si ocurre cuando est trabajado, conduciendo vehculos o realizando actividades difciles.  CAUSAS  El vrtigo se produce cuando hay un conflicto en las seales que se envan al cerebro desde los sistemas visual y sensorial del cuerpo. Hay numerosas causas que [employer], entre las que se incluyen:   Infecciones, especialmente en el odo interno.  Nelia Shi reaccin a un medicamento o mal uso de alcohol y frmacos.  Abstinencia de drogas o alcohol.  Cambios rpidos de posicin,  como al [employer] o darse vuelta en la cama.  Dolor de Surveyor, minerals.  Disminucin del flujo sanguneo hacia el cerebro.  Aumento de la presin en el cerebro por un traumatismo, infeccin, tumor o sangrado en la cabeza. SNTOMAS  Puede sentir como si el mundo da vueltas o va a caer al piso. Como hay problemas en el equilibrio, el vrtigo puede causar nuseas y vmitos. Tiene movimientos oculares involuntarios (nistagmus).  DIAGNSTICO  El vrtigo normalmente se diagnostica con un examen fsico. Si la causa no se conoce, el mdico puede indicar diagnstico por imgenes, como una resonancia magntica (imgenes por Health visitor).  TRATAMIENTO  La mayor parte de los casos de vrtigo se resuelve sin TEFL teacher. Segn la causa, el mdico podr recetar ciertos medicamentos. Si se relaciona con la posicin del cuerpo, podr recomendarle movimientos o procedimientos para corregir el problema. En algunos casos raros, si la causa del vrtigo es un problema en el odo interno, necesitar [place].  INSTRUCCIONES PARA EL CUIDADO DOMICILIARIO  Siga las indicaciones del mdico.  Evite conducir vehculos.  Evite operar maquinarias pesadas.  Evite realizar tareas que seran peligrosas para usted u otras personas durante un episodio de vrtigo.  Comunquele al mdico si nota que ciertos medicamentos parecen asociarse con las crisis. Algunos medicamentos que se usan para tratar los episodios, en [employer]. SOLICITE ATENCIN MDICA DE INMEDIATO SI:  Los medicamentos no [employer] crisis  o hacen que estas empeoren.  Tiene dificultad para hablar, caminar, siente debilidad o tiene problemas para [employer], las manos o las piernas.  Comienza a sufrir un dolor de cabeza intenso.  Las nuseas y los vmitos no se [place] o se Press photographer.  Aparecen trastornos visuales.  Un miembro de su familia nota cambios en su conducta.  Hay alguna modificacin en su trastorno que  parece Holiday representative de Scientist, clinical (histocompatibility and immunogenetics). ASEGRESE DE QUE:   Comprende estas instrucciones.  Controlar su enfermedad.  Solicitar ayuda de inmediato si no mejora o si empeora. Document Released: 12/19/2004 Document Revised: 06/03/2011 [facility] Patient Information 2015 [place], [place]. This information is not intended to replace advice given to you by your health care provider. Make sure you discuss any questions you have with your health care provider.

## 2014-08-21 NOTE — ED Notes (Signed)
Pt arrived via EMS c/o left sided chest pain 10/10.  Pt took 2 nitroglycerin tabs of his own prescription.  EMS gave an additional 2 nitroglycerin tablets, 6mg  morphine, 4mg  zofran and a bolus of NS.  Pain now 4/10.  Family at bedside.

## 2014-08-21 NOTE — Consult Note (Signed)
Referring Physician: Rhunette Croft    Chief Complaint: Dizziness  HPI: Phillip [name] is an 49 y.o. male who was brought to the ED overnight due to chest pain.  The patient reports that he was working when he had the onset of chest pain.  He took two NTG tablets at home without improvement and EMS was called.  En route he had the onset of dizziness after the administration of morphine, further NTG and Zofran.  After arrival in the ED during work up this dizziness resolved.  Later while in the ED his dizziness returned but was more severe.  He was given Meclizine and this has since improved dramatically.  BP has been normal throughout stay in the ED.   Patient repots that he has intermittent dizziness at times that will usually resolve spontaneously.   Patient does not speak [place].  Son provides interpretation.  Date last known well: Date: 08/21/2014 Time last known well: Unable to determine tPA Given: No: Resolution of symptoms  Past Medical History  Diagnosis Date  . CHF (congestive heart failure)   . Hypertension   . Chronic back pain     "neck down" (07/20/2013)  . GERD (gastroesophageal reflux disease)   . High cholesterol   . Cardiac insufficiency   . Renal calculus     Past Surgical History  Procedure Laterality Date  . Eye surgery Right   . Corneal transplant Right ~ 2010  . Cataract extraction w/ intraocular lens implant Right 02/16/2014  . Cardiac catheterization  2014?  [name] Left heart catheterization with coronary angiogram N/A 05/26/2014    Procedure: LEFT HEART CATHETERIZATION WITH CORONARY ANGIOGRAM;  Surgeon: Laurey Morale, MD;  Location: [facility] CATH LAB;  Service: Cardiovascular;  Laterality: N/A;   Family history: Mother and sister with hypertension  Social History:  reports that he has never smoked. He has never used smokeless tobacco. He reports that he does not drink alcohol or use illicit drugs.  Allergies: No Known Allergies  Medications: I have reviewed the  patient's current medications. Prior to Admission:  Current outpatient prescriptions:  .  amLODipine (NORVASC) 5 MG tablet, Take 1 tablet (5 mg total) by mouth daily., Disp: 30 tablet, Rfl: 2 .  carvedilol (COREG) 25 MG tablet, Take 1 tablet (25 mg total) by mouth 2 (two) times daily with a meal., Disp: 60 tablet, Rfl: 3 .  enalapril (VASOTEC) 20 MG tablet, Take 1 tablet (20 mg total) by mouth 2 (two) times daily., Disp: 60 tablet, Rfl: 3 .  furosemide (LASIX) 20 MG tablet, Take 1 tablet (20 mg total) by mouth daily., Disp: 30 tablet, Rfl: 3 .  nitroGLYCERIN (NITROSTAT) 0.4 MG SL tablet, Place 1 tablet (0.4 mg total) under the tongue every 5 (five) minutes as needed for chest pain (CP or SOB)., Disp: 30 tablet, Rfl: 3 .  spironolactone (ALDACTONE) 25 MG tablet, Take 1 tablet (25 mg total) by mouth daily., Disp: 30 tablet, Rfl: 3 .  meclizine (ANTIVERT) 25 MG tablet, Take 1 tablet (25 mg total) by mouth 3 (three) times daily as needed for dizziness., Disp: 28 tablet, Rfl: 0  ROS: History obtained from the patient  General ROS: negative for - chills, fatigue, fever, night sweats, weight gain or weight loss Psychological ROS: negative for - behavioral disorder, hallucinations, memory difficulties, mood swings or suicidal ideation Ophthalmic ROS: poor vision right eye ENT ROS: negative for - epistaxis, nasal discharge, oral lesions, sore throat, tinnitus or vertigo Allergy and Immunology ROS: negative for -  hives or itchy/watery eyes Hematological and Lymphatic ROS: negative for - bleeding problems, bruising or swollen lymph nodes Endocrine ROS: negative for - galactorrhea, hair pattern changes, polydipsia/polyuria or temperature intolerance Respiratory ROS: negative for - cough, hemoptysis, shortness of breath or wheezing Cardiovascular ROS: chest pain Gastrointestinal ROS: negative for - abdominal pain, diarrhea, hematemesis, nausea/vomiting or stool incontinence Genito-Urinary ROS: negative  for - dysuria, hematuria, incontinence or urinary frequency/urgency Musculoskeletal ROS: negative for - joint swelling or muscular weakness Neurological ROS: as noted in HPI Dermatological ROS: negative for rash and skin lesion changes  Physical Examination: Blood pressure 145/88, pulse 64, temperature 98.3 F (36.8 C), temperature source Oral, resp. rate 22, height 5\' 8"  (1.727 m), weight 99.338 kg (219 lb), SpO2 100 %.  HEENT-  Normocephalic, no lesions, without obvious abnormality.  Normal external eye and conjunctiva.  Normal TM's bilaterally.  Normal auditory canals and external ears. Normal external nose, mucus membranes and septum.  Normal pharynx. Cardiovascular- S1, S2 normal, pulses palpable throughout   Lungs- chest clear, no wheezing, rales, normal symmetric air entry Abdomen- soft, non-tender; bowel sounds normal; no masses,  no organomegaly Extremities- no edema Lymph-no adenopathy palpable Musculoskeletal-no joint tenderness, deformity or swelling Skin-warm and dry, no hyperpigmentation, vitiligo, or suspicious lesions  Neurological Examination Mental Status: Alert, oriented, thought content appropriate.  Speech fluent without evidence of aphasia.  Able to follow 3 step commands without difficulty. Cranial Nerves: II: Discs flat bilaterally; Visual fields grossly normal in the left eye, pupils reactive to light and accommodation III,IV, VI: ptosis not present, extra-ocular motions intact bilaterally V,VII: smile symmetric, facial light touch sensation normal bilaterally VIII: hearing normal bilaterally IX,X: gag reflex present XI: bilateral shoulder shrug XII: midline tongue extension Motor: Right : Upper extremity   5/5    Left:     Upper extremity   5/5  Lower extremity   5/5     Lower extremity   5/5 Tone and bulk:normal tone throughout; no atrophy noted Sensory: Pinprick and light touch intact throughout, bilaterally Deep Tendon Reflexes: 2+ and symmetric  throughout Plantars: Right: downgoing   Left: downgoing Cerebellar: normal finger-to-nose and normal heel-to-shin testing bilaterally Gait: Patient able to sit and stand without assistance.  Gait normal.     Laboratory Studies:  Basic Metabolic Panel:  Recent Labs Lab 08/21/14 0057  NA 138  K 4.0  CL 105  CO2 26  GLUCOSE 101*  BUN 25*  CREATININE 1.01  CALCIUM 8.9    Liver Function Tests: No results for input(s): AST, ALT, ALKPHOS, BILITOT, PROT, ALBUMIN in the last 168 hours. No results for input(s): LIPASE, AMYLASE in the last 168 hours. No results for input(s): AMMONIA in the last 168 hours.  CBC:  Recent Labs Lab 08/21/14 0057  WBC 9.6  NEUTROABS 5.7  HGB 13.1  HCT 39.9  MCV 84.9  PLT 283    Cardiac Enzymes: No results for input(s): CKTOTAL, CKMB, CKMBINDEX, TROPONINI in the last 168 hours.  BNP: Invalid input(s): POCBNP  CBG: No results for input(s): GLUCAP in the last 168 hours.  Microbiology: No results found for this or any previous visit.  Coagulation Studies: No results for input(s): LABPROT, INR in the last 72 hours.  Urinalysis: No results for input(s): COLORURINE, LABSPEC, PHURINE, GLUCOSEU, HGBUR, BILIRUBINUR, KETONESUR, PROTEINUR, UROBILINOGEN, NITRITE, LEUKOCYTESUR in the last 168 hours.  Invalid input(s): APPERANCEUR  Lipid Panel:    Component Value Date/Time   CHOL 213* 05/02/2014 2019   TRIG 194* 05/02/2014 2019  HDL 34* 05/02/2014 2019   CHOLHDL 6.3 05/02/2014 2019   VLDL 39 05/02/2014 2019   LDLCALC 140* 05/02/2014 2019    HgbA1C:  Lab Results  Component Value Date   HGBA1C 5.8* 05/02/2014    Urine Drug Screen:     Component Value Date/Time   LABOPIA NONE DETECTED 07/25/2013 0220   COCAINSCRNUR NONE DETECTED 07/25/2013 0220   LABBENZ NONE DETECTED 07/25/2013 0220   AMPHETMU NONE DETECTED 07/25/2013 0220   THCU NONE DETECTED 07/25/2013 0220   LABBARB NONE DETECTED 07/25/2013 0220    Alcohol Level: No results  for input(s): ETH in the last 168 hours.  Other results: EKG: sinus rhythm at 68 bpm.  Imaging: Dg Chest 2 View  08/21/2014   CLINICAL DATA:  Stabbing LEFT chest pain and pressure beginning 2 hours ago. History of CHF, hypertension.  EXAM: CHEST  2 VIEW  COMPARISON:  Chest radiograph May 09, 2014  FINDINGS: Cardiomediastinal silhouette is unremarkable. The lungs are clear without pleural effusions or focal consolidations. Trachea projects midline and there is no pneumothorax. Soft tissue planes and included osseous structures are non-suspicious.  IMPRESSION: No acute cardiopulmonary process.   Electronically Signed   By: Awilda Metro M.D.   On: 08/21/2014 01:54   Ct Head Wo Contrast  08/21/2014   CLINICAL DATA:  Dizziness this morning when waking up  EXAM: CT HEAD WITHOUT CONTRAST  TECHNIQUE: Contiguous axial images were obtained from the base of the skull through the vertex without intravenous contrast.  COMPARISON:  07/24/2013  FINDINGS: Skull and Sinuses:Mucous retention cysts in the left maxillary antrum, stable from 2015. No acute fracture or destructive process.  Orbits: No acute abnormality.  Brain: No evidence of acute infarction, hemorrhage, hydrocephalus, or mass lesion/mass effect.  IMPRESSION: Negative head CT.   Electronically Signed   By: Marnee Spring M.D.   On: 08/21/2014 06:05    Assessment: 49 y.o. male presenting with chest pain and dizziness.  Symptoms now resolved.  Has occasional episodes of dizziness.  Head CT personally reviewed and shows no acute changes.  Neurological examination non-focal at this time and per ED physician when symptoms were more severe.  Do not feel this represents TIA/CVA.  Patient refuses MRI.    Stroke Risk Factors - hyperlipidemia and hypertension  Plan: 1. ASA  daily 2. Follow up with outpatient PCP  Case discussed with Dr. Carver Fila, MD [facility] [phone] 08/21/2014, 6:24 AM

## 2014-08-21 NOTE — ED Provider Notes (Signed)
CSN: [id number]     Arrival date & time 08/20/14  2352 History  This chart was scribed for Derwood Kaplan, MD by Phillis Haggis, ED Scribe. This patient was seen in room A12C/A12C and patient care was started at 12:12 AM.    Chief Complaint  Patient presents with  . Chest Pain   The history is provided by the patient and a relative. No language interpreter was used.  HPI Comments: Phillip [name] is a 49 y.o. male with a history of CHF, HTN, GERD, and renal calculus who presents to the Emergency Department brought in by EMS complaining of left sided stabbing, pressured chest pain onset 2 hours ago. Patient states that he was working with his hands when the pain started. He states that the pain radiated to his left arm and leg with numbness. He reports taking 2 nitroglycerin tablets at home to no relief. He states that the pain worsened between the time he took the medication and when the paramedics arrived. He states that the pain lessened once he received an injection of morphine from paramedics. He reports associated cold sweats, dizziness, nausea, headache, eye pain and SOB. He reports similar symptoms in the past, but states that this pain was worse than those episodes. He reports lesser chest pain at least one or two times a week. He currently rates his pain 3/10. He states that he takes his prescribed medications everyday, but did not today. Patient denies leg swelling or weight gain. He denies any recent surgeries or long distance travel. He denies history of blood clots or stroke. Patient denies smoking. Patient does not speak [place]; translated by son.   Past Medical History  Diagnosis Date  . CHF (congestive heart failure)   . Hypertension   . Chronic back pain     "neck down" (07/20/2013)  . GERD (gastroesophageal reflux disease)   . High cholesterol   . Cardiac insufficiency   . Renal calculus    Past Surgical History  Procedure Laterality Date  . Eye surgery Right   .  Corneal transplant Right ~ 2010  . Cataract extraction w/ intraocular lens implant Right 02/16/2014  . Cardiac catheterization  2014?  [name] Left heart catheterization with coronary angiogram N/A 05/26/2014    Procedure: LEFT HEART CATHETERIZATION WITH CORONARY ANGIOGRAM;  Surgeon: Laurey Morale, MD;  Location: [facility] CATH LAB;  Service: Cardiovascular;  Laterality: N/A;   History reviewed. No pertinent family history. History  Substance Use Topics  . Smoking status: Never Smoker   . Smokeless tobacco: Never Used  . Alcohol Use: No    Review of Systems  Constitutional: Positive for diaphoresis. Negative for unexpected weight change.  Eyes: Positive for pain.  Respiratory: Positive for shortness of breath.   Cardiovascular: Positive for chest pain. Negative for leg swelling.  Gastrointestinal: Positive for nausea.  Musculoskeletal: Positive for arthralgias.  Neurological: Positive for dizziness, numbness and headaches.   Allergies  Review of patient's allergies indicates no known allergies.  Home Medications   Prior to Admission medications   Medication Sig Start Date End Date Taking? Authorizing Provider  amLODipine (NORVASC) 5 MG tablet Take 1 tablet (5 mg total) by mouth daily. 08/17/14  Yes Amy D Clegg, NP  carvedilol (COREG) 25 MG tablet Take 1 tablet (25 mg total) by mouth 2 (two) times daily with a meal. 08/03/14  Yes Amy D Clegg, NP  enalapril (VASOTEC) 20 MG tablet Take 1 tablet (20 mg total) by mouth 2 (two) times daily.  08/03/14  Yes Amy D Clegg, NP  furosemide (LASIX) 20 MG tablet Take 1 tablet (20 mg total) by mouth daily. 08/03/14  Yes Amy D Clegg, NP  nitroGLYCERIN (NITROSTAT) 0.4 MG SL tablet Place 1 tablet (0.4 mg total) under the tongue every 5 (five) minutes as needed for chest pain (CP or SOB). 05/04/14  Yes Su Hoff, MD  spironolactone (ALDACTONE) 25 MG tablet Take 1 tablet (25 mg total) by mouth daily. 08/03/14  Yes Amy D Clegg, NP  meclizine (ANTIVERT) 25 MG tablet  Take 1 tablet (25 mg total) by mouth 3 (three) times daily as needed for dizziness. 08/21/14   Cassadee Vanzandt, MD   BP 109/65 mmHg  Pulse 70  Temp(Src) 98.2 F (36.8 C) (Oral)  Resp 16  Ht  (1.727 m)  Wt 219 lb (99.338 kg)  BMI 33.31 kg/m2  SpO2 99%  Physical Exam  Constitutional: He is oriented to person, place, and time. He appears well-developed and well-nourished.  HENT:  Head: Normocephalic and atraumatic.  Eyes: Conjunctivae and EOM are normal. Pupils are equal, round, and reactive to light.  Neck: Normal range of motion. Neck supple. No JVD present.  Cardiovascular: Normal rate, regular rhythm and normal heart sounds.   Pulses:      Radial pulses are 2+ on the right side, and 2+ on the left side.  Pulmonary/Chest: Effort normal and breath sounds normal.  Right base rales  Abdominal: Soft.  Musculoskeletal: Normal range of motion.  No pitting edema; no unilateral calf swelling  Neurological: He is alert and oriented to person, place, and time.  Skin: Skin is warm and dry.  Psychiatric: He has a normal mood and affect. His behavior is normal.  Nursing note and vitals reviewed.   ED Course  Procedures (including critical care time) DIAGNOSTIC STUDIES: Oxygen Saturation is 98% on room air, normal by my interpretation.    COORDINATION OF CARE: 12:24 AM-Discussed treatment plan which includes x-ray with pt at bedside and pt agreed to plan.   Labs Review Labs Reviewed  BASIC METABOLIC PANEL - Abnormal; Notable for the following:    Glucose, Bld 101 (*)    BUN 25 (*)    All other components within normal limits  CBC WITH DIFFERENTIAL/PLATELET  BRAIN NATRIURETIC PEPTIDE  I-STAT TROPOININ, ED  Rosezena Sensor, ED    Imaging Review Dg Chest 2 View  08/21/2014   CLINICAL DATA:  Stabbing LEFT chest pain and pressure beginning 2 hours ago. History of CHF, hypertension.  EXAM: CHEST  2 VIEW  COMPARISON:  Chest radiograph May 09, 2014  FINDINGS:  Cardiomediastinal silhouette is unremarkable. The lungs are clear without pleural effusions or focal consolidations. Trachea projects midline and there is no pneumothorax. Soft tissue planes and included osseous structures are non-suspicious.  IMPRESSION: No acute cardiopulmonary process.   Electronically Signed   By: Awilda Metro M.D.   On: 08/21/2014 01:54   Ct Head Wo Contrast  08/21/2014   CLINICAL DATA:  Dizziness this morning when waking up  EXAM: CT HEAD WITHOUT CONTRAST  TECHNIQUE: Contiguous axial images were obtained from the base of the skull through the vertex without intravenous contrast.  COMPARISON:  07/24/2013  FINDINGS: Skull and Sinuses:Mucous retention cysts in the left maxillary antrum, stable from 2015. No acute fracture or destructive process.  Orbits: No acute abnormality.  Brain: No evidence of acute infarction, hemorrhage, hydrocephalus, or mass lesion/mass effect.  IMPRESSION: Negative head CT.   Electronically Signed   By:  Marnee Spring M.D.   On: 08/21/2014 06:05     EKG Interpretation   Date/Time:  Sunday Aug 21 2014 00:01:13 EDT Ventricular Rate:  68 PR Interval:  173 QRS Duration: 96 QT Interval:  408 QTC Calculation: 434 R Axis:   22 Text Interpretation:  Sinus rhythm Ventricular premature complex  Borderline repolarization abnormality inferior and lateral t wave  inversions - unchanges Nonspecific ST and T wave abnormality Confirmed by  Darcel Frane, MD, Kassidy Frankson ([id number]) on 08/21/2014 6:28:27 AM      @4 :30 am Chest pain is better, still there - mild. Now pt has dizziness -described as constant spinning sensation, even whilst he is laying down. Pt unable to participate in HINTs exam. Will get MRI to ensure this is not a central process.  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 He states last MRI was aborted. Non focal neuro exam. Consulted Neuro - pt is a lot better per them and he ambulated as well.  Will d.c.  Patient REFUSES the MRI Patient understands that his/her actions will lead to inadequate medical workup, and that he/she is at risk of complications of missed diagnosis, which includes morbidity and mortality.  Patient is demonstrating good capacity to make decision. Patient understands that he/she needs to return to the ER immediately if his/her symptoms get worse.    MDM   Final diagnoses:  Chest pain  Dizziness  Vertigo   I personally performed the services described in this documentation, which was scribed in my presence. The recorded information has been reviewed and is accurate.  Pt comes in with chest pain. L sided, severe, no response to nitro - but a lot comfortable post morphine. Had a neg CATH in march, 2016. Pt has no drug use, smoking. Pain is not pleuritic. No concerns for dissection.   Derwood Kaplan, MD 08/21/14 [phone]

## 2014-08-24 ENCOUNTER — Emergency Department (HOSPITAL_COMMUNITY): Payer: Medicaid Other

## 2014-08-24 ENCOUNTER — Encounter (HOSPITAL_COMMUNITY): Payer: Self-pay | Admitting: *Deleted

## 2014-08-24 DIAGNOSIS — R05 Cough: Secondary | ICD-10-CM | POA: Insufficient documentation

## 2014-08-24 DIAGNOSIS — I509 Heart failure, unspecified: Secondary | ICD-10-CM | POA: Insufficient documentation

## 2014-08-24 DIAGNOSIS — Z8719 Personal history of other diseases of the digestive system: Secondary | ICD-10-CM | POA: Insufficient documentation

## 2014-08-24 DIAGNOSIS — Z9889 Other specified postprocedural states: Secondary | ICD-10-CM | POA: Insufficient documentation

## 2014-08-24 DIAGNOSIS — I10 Essential (primary) hypertension: Secondary | ICD-10-CM | POA: Insufficient documentation

## 2014-08-24 DIAGNOSIS — R0789 Other chest pain: Secondary | ICD-10-CM | POA: Insufficient documentation

## 2014-08-24 DIAGNOSIS — Z87442 Personal history of urinary calculi: Secondary | ICD-10-CM | POA: Insufficient documentation

## 2014-08-24 DIAGNOSIS — G8929 Other chronic pain: Secondary | ICD-10-CM | POA: Insufficient documentation

## 2014-08-24 DIAGNOSIS — J069 Acute upper respiratory infection, unspecified: Secondary | ICD-10-CM | POA: Insufficient documentation

## 2014-08-24 DIAGNOSIS — Z79899 Other long term (current) drug therapy: Secondary | ICD-10-CM | POA: Insufficient documentation

## 2014-08-24 DIAGNOSIS — Z8639 Personal history of other endocrine, nutritional and metabolic disease: Secondary | ICD-10-CM | POA: Insufficient documentation

## 2014-08-24 NOTE — ED Notes (Signed)
Pt c/o chest pain since 8p. States he was here Saturday for chest pain as well. States that the pain is also in his throat.

## 2014-08-25 ENCOUNTER — Emergency Department (HOSPITAL_COMMUNITY)
Admission: EM | Admit: 2014-08-25 | Discharge: 2014-08-25 | Disposition: A | Discharge status: Home / Self Care | Payer: Medicaid Other | Attending: Emergency Medicine | Admitting: Emergency Medicine

## 2014-08-25 DIAGNOSIS — J069 Acute upper respiratory infection, unspecified: Secondary | ICD-10-CM

## 2014-08-25 DIAGNOSIS — R0789 Other chest pain: Secondary | ICD-10-CM

## 2014-08-25 DIAGNOSIS — R079 Chest pain, unspecified: Secondary | ICD-10-CM

## 2014-08-25 LAB — BASIC METABOLIC PANEL
Anion gap: 11 (ref 5–15)
BUN: 26 mg/dL — ABNORMAL HIGH (ref 6–20)
CALCIUM: 9.8 mg/dL (ref 8.9–10.3)
CHLORIDE: 104 mmol/L (ref 101–111)
CO2: 24 mmol/L (ref 22–32)
Creatinine, Ser: 1.18 mg/dL (ref 0.61–1.24)
GFR calc non Af Amer: >60 mL/min (ref >60)
Glucose, Bld: 117 mg/dL — ABNORMAL HIGH (ref 65–99)
Potassium: 3.8 mmol/L (ref 3.5–5.1)
Sodium: 139 mmol/L (ref 135–145)

## 2014-08-25 LAB — I-STAT TROPONIN, ED
Troponin i, poc: 0 ng/mL (ref 0.00–0.08)
Troponin i, poc: 0.01 ng/mL (ref 0.00–0.08)

## 2014-08-25 LAB — CBC
HCT: 42.4 % (ref 39.0–52.0)
HEMOGLOBIN: 14 g/dL (ref 13.0–17.0)
MCH: 27.9 pg (ref 26.0–34.0)
MCHC: 33 g/dL (ref 30.0–36.0)
MCV: 84.6 fL (ref 78.0–100.0)
Platelets: 335 10*3/uL (ref 150–400)
RBC: 5.01 MIL/uL (ref 4.22–5.81)
RDW: 14.7 % (ref 11.5–15.5)
WBC: 10.3 10*3/uL (ref 4.0–10.5)

## 2014-08-25 MED ORDER — BENZONATATE 100 MG PO CAPS: 100.0000 mg | ORAL_CAPSULE | Freq: Three times a day (TID) | ORAL | Status: DC

## 2014-08-25 MED ORDER — IBUPROFEN 600 MG PO TABS: 600.0000 mg | ORAL_TABLET | Freq: Three times a day (TID) | ORAL | Status: DC | PRN

## 2014-08-25 NOTE — Discharge Instructions (Signed)
Dolor de la pared torcica (Chest Wall Pain) Dolor en la pared torcica es dolor en o alrededor de los huesos y msculos de su pecho. Podrn pasar hasta 6 semanas hasta que comience a mejorar. Puede demorar ms tiempo si es fsicamente activo en su Aleen Campi y [place].  CAUSAS  El dolor en el pecho puede aparecer sin motivo. No obstante, algunas causas pueden ser:   Neomia Dear enfermedad viral como la gripe.  Traumatismos.  Tos.  La prctica de ejercicios.  Artritis.  Fibromialgia  Culebrilla. INSTRUCCIONES PARA EL CUIDADO DOMICILIARIO  Evite hacer actividad fsica extenuante. Trate de no esforzarse o Electrical engineer. Aqu se incluyen las actividades en las que [place] los msculos del trax, los abdominales y los msculos laterales, especialmente si debe levantar objetos pesados.  Aplique hielo sobre la zona dolorida.  Ponga el hielo en una bolsa plstica.  Colquese una toalla entre la piel y la bolsa de hielo.  Deje la bolsa de hielo durante 15 a 20 minutos por hora, durante los primeros 2 [address]  [address].  Utilice los medicamentos de venta libre o de prescripcin para Chief Technology Officer, Environmental health practitioner o la [place], segn se lo indique el profesional que lo asiste. SOLICITE ATENCIN MDICA DE INMEDIATO SI:  El dolor aumenta o siente muchas molestias.  Tiene fiebre.  El dolor de [place].  Desarrolla nuevos e inexplicables sntomas.  Tiene nuseas o vmitos.  Berenice Primas o se siente mareado.  Tiene tos con flema (esputo), o tose con sangre. EST SEGURO QUE:   Comprende las instrucciones para el alta mdica.  Controlar su enfermedad.  Solicitar atencin mdica de inmediato segn las indicaciones. Document Released: 04/22/2006 Document Revised: 06/03/2011 [facility] Patient Information 2015 [place], [place]. This information is not intended to replace advice given to you by your health care provider. Make sure you discuss any questions you have with your health care  provider.  Infeccin de las vas areas superiores en los adultos (Upper Respiratory Infection, Adult)  La infeccin respiratoria de las vas areas superiores se conoce tambin como resfro comn. Las vas areas superiores [employer] senos nasales, la garganta, la trquea, y los bronquios. Los bronquios son las vas areas que conducen el aire a los pulmones. La mayor parte de las personas mejora luego de una [place], Biomedical engineer los sntomas pueden durar [employer]. La tos residual puede durar ms. CAUSAS Varios tipos de virus pueden causar la infeccin de los tejidos que cubren las vas areas superiores. Los tejidos se irritan y se inflaman y se originan secreciones. Tambin es frecuente la produccin de moco. El resfro es contagioso. El virus se disemina fcilmente a otras personas por contacto oral. Aqu se incluyen los besos, el compartir un vaso y el toser o Engineering geologist. Tambin puede diseminarse tocndose la boca o la [place] y luego tocando una superficie que luego tocan Economist.  SNTOMAS Los sntomas se desarrollan entre uno y [place] luego de Cytogeneticist en contacto con el virus. Pueden variar de Neomia Dear persona a otra. Incluyen:  Secrecin nasal.  Estornudos  Congestin nasal.  Irritacin de los senos nasales.  Dolor de Advertising copywriter.  Prdida de la voz (laringitis).  Tos.  Fatiga.  Dolores musculares.  Prdida del apetito.  Dolor de [place].  Fiebre no muy elevada. DIAGNSTICO Puede diagnosticarse a s mismo la infeccin respiratoria, segn los sntomas habituales, ya que la mayor parte de las personas se resfra dos o tres veces al ao. El profesional puede confirmarlo basndose en el examen  fsico. Lo ms importante es que el profesional verifique que los sntomas no se deben a otra enfermedad como anginas, sinusitis, neumona, asma o epiglotitis. Para diagnosticar el resfrio comn, no es necesario que haga anlisis de [employer], pruebas en la garganta o radiografas, pero  en algunos casos puede ser de utilidad para excluir otros problemas ms graves. El mdico decidir si necesita otras pruebas. RIESGOS Y COMPLICACIONES Tendr mayor riesgo de sufrir un resfro grave si consume cigarrillos, sufre una enfermedad cardaca (como insuficiencia cardaca) o pulmonar crnica (como asma) o si tiene un debilitamiento del sistema inmunolgico. Las personas muy jvenes o muy mayores tienen riesgo de sufrir infecciones ms graves. La sinusitis bacteriana, las infecciones del odo medio y la neumona bacteriana pueden complicar el resfro comn. El resfro puede exacerbar el asma y la enfermedad pulmonar obstructiva crnica. En algunos casos estas complicaciones requieren la atencin en un servicio de emergencias y pueden poner en peligro la vida. PREVENCIN La mejor manera de protegerse para no contraer un resfro es Pharmacologist una buena higiene. Evite el contacto bucal o de las manos con personas con sntomas de resfro. Si se produce el contacto, lvese las manos con frecuencia. No hay pruebas firmes que indiquen que la vitamina C, la vitamina E, la equincea o la actividad fsica reduzcan las posibilidades de tener una infeccin. Sin embargo, siempre se recomienda Insurance account manager y Winferd Humphrey buena nutricin. TRATAMIENTO El tratamiento est dirigido a Consulting civil engineer sntomas. Esta enfermedad no tiene [place]. Los antibiticos no son eficaces, ya que esta infeccin la causa un virus y no una bacteria. El tratamiento incluye:  Aumente la ingesta de lquidos. Consumo de bebidas deportivas, que proporcionan electrolitos,azcares e hidratacin.  Inhale vapor caliente (de un vaporizador o de la ducha).  Tomar sopa de pollo u otros lquidos claros, y [employer] buena nutricin.  Descanse lo suficiente.  Haga grgaras o coma pastillas para [employer].  Control de la fiebre con ibuprofeno o acetaminofen, segn las indicaciones del mdico.  Aumento del uso del inhalador, si sufre  asma. Las pastillas y los geles de zinc durante las primeras 24 horas de iniciado el resfro comn, pueden disminuir la duracin y Paramedic la gravedad de los sntomas. Los medicamentos para Chief Technology Officer pueden disminuir la fiebre, Paramedic los dolores musculares y Chief Technology Officer de Advertising copywriter. Se dispone de una gran variedad de medicamentos de venta libre para tratar la congestin y la secrecin nasal. El profesional podr recomendarle inhalantes para los otros sntomas. INSTRUCCIONES PARA EL CUIDADO DOMICILIARIO  Utilice los medicamentos de venta libre o de prescripcin para Chief Technology Officer, el malestar o la [place], segn se lo indique el profesional que lo asiste.  Utilice un vaporizador caliente o inhale vapor, haciendo salir agua de la ducha para aumentar la humedad [place]. Esto mantendr las secreciones hmedas y Community education officer ms fcil respirar.  Beba gran cantidad de lquido para mantener la orina de tono claro o color amarillo plido.  Descanse todo lo que pueda.  Regrese a su trabajo cuando la temperatura se haya normalizado, o cuando el profesional que lo asiste se lo indique. Quizs sea necesario que permanezca en su casa durante un tiempo ms prolongado para Buyer, retail a Economist. Tambin puede utilizar un barbijo y ser cuidadoso con el lavado de manos para evitar la diseminacin del virus. SOLICITE ATENCIN MDICA SI:  Luego de los primeros das siente que empeora en vez de [place].  Necesita que Occupational psychologist brinde ms informacin relacionada con los  medicamentos para controlar los sntomas.  Siente escalofros, le falta el aire o escupe moco de color marrn o rojo. Estos pueden ser sntomas de neumona.  Tiene una secrecin nasal de color amarillo o marrn, o siente dolor en el rostro, especialmente cuando se inclina hacia adelante. Estos pueden ser sntomas de sinusitis.  Tiene fiebre, siente el cuello hinchado, tiene dolor al tragar u observa manchas blancas en el fondo de la  garganta. Estos pueden ser sntomas de angina por estreptococo. Romona Curls ATENCIN MDICA DE INMEDIATO SI:  Lance Muss.  Comienza a sentir Herbalist de cabeza intenso o persistente, dolor de odos, en el seno nasal o en el pecho.  Tiene tos y esta se prolonga demasiado, tose y escupe sangre, la mucosidad habitual se modifica (si tiene una enfermedad pulmonar crnica) o respira con dificultad.  Siente rigidez en el cuello o dolor de cabeza intenso. Document Released: 12/19/2004 Document Revised: 06/03/2011 [facility] Patient Information 2015 [place], [place]. This information is not intended to replace advice given to you by your health care provider. Make sure you discuss any questions you have with your health care provider.

## 2014-08-25 NOTE — ED Notes (Signed)
Pt c/o chest pain that radiates to his throat since 2000 tonight and non productive cough. States he was seen last week for the same and was advised to follow up with cardiology. Pt alert, oriented, nad.

## 2014-08-25 NOTE — ED Provider Notes (Signed)
CSN: [id number]     Arrival date & time 08/24/14  2329 History  This chart was scribed for Loren Racer, MD by Abel Presto, ED Scribe. This patient was seen in room A08C/A08C and the patient's care was started at 1:08 AM.    Chief Complaint  Patient presents with  . Chest Pain     The history is provided by the patient and a relative. No language interpreter was used.   HPI Comments: Phillip [name] is a 49 y.o. male who presents to the Emergency Department complaining of 5/10 stabbing lower chest pain with onset at 8 PM. Pt's daughter is here to translate.  Pt was exerting himself at onset, states he was "pulling something."  Pt took NTG with no relief. Pt reports dysphonia with onset this evening and intermittent cough. Pt was last seen in ED on 08/20/14 for left sided chest pain with no significant findings. Pt denies SOB.  Past Medical History  Diagnosis Date  . CHF (congestive heart failure)   . Hypertension   . Chronic back pain     "neck down" (07/20/2013)  . GERD (gastroesophageal reflux disease)   . High cholesterol   . Cardiac insufficiency   . Renal calculus    Past Surgical History  Procedure Laterality Date  . Eye surgery Right   . Corneal transplant Right ~ 2010  . Cataract extraction w/ intraocular lens implant Right 02/16/2014  . Cardiac catheterization  2014?  [name] Left heart catheterization with coronary angiogram N/A 05/26/2014    Procedure: LEFT HEART CATHETERIZATION WITH CORONARY ANGIOGRAM;  Surgeon: Laurey Morale, MD;  Location: [facility] CATH LAB;  Service: Cardiovascular;  Laterality: N/A;   No family history on file. History  Substance Use Topics  . Smoking status: Never Smoker   . Smokeless tobacco: Never Used  . Alcohol Use: No    Review of Systems  Constitutional: Negative for fever.  HENT: Positive for voice change.   Respiratory: Positive for cough. Negative for shortness of breath.   Cardiovascular: Positive for chest pain.   Gastrointestinal: Negative for nausea, vomiting and abdominal pain.  Musculoskeletal: Negative for back pain, neck pain and neck stiffness.  Skin: Negative for rash and wound.  Neurological: Negative for dizziness, weakness, light-headedness, numbness and headaches.  All other systems reviewed and are negative.     Allergies  Review of patient's allergies indicates no known allergies.  Home Medications   Prior to Admission medications   Medication Sig Start Date End Date Taking? Authorizing Provider  amLODipine (NORVASC) 5 MG tablet Take 1 tablet (5 mg total) by mouth daily. 08/17/14   Amy D Filbert Schilder, NP  benzonatate (TESSALON) 100 MG capsule Take 1 capsule (100 mg total) by mouth every 8 (eight) hours. 08/25/14   Loren Racer, MD  carvedilol (COREG) 25 MG tablet Take 1 tablet (25 mg total) by mouth 2 (two) times daily with a meal. 08/03/14   Amy D Clegg, NP  enalapril (VASOTEC) 20 MG tablet Take 1 tablet (20 mg total) by mouth 2 (two) times daily. 08/03/14   Amy D Filbert Schilder, NP  furosemide (LASIX) 20 MG tablet Take 1 tablet (20 mg total) by mouth daily. 08/03/14   Amy D Filbert Schilder, NP  ibuprofen (ADVIL,MOTRIN) 600 MG tablet Take 1 tablet (600 mg total) by mouth 3 (three) times daily with meals as needed. 08/25/14   Loren Racer, MD  meclizine (ANTIVERT) 25 MG tablet Take 1 tablet (25 mg total) by mouth 3 (three) times daily  as needed for dizziness. 08/21/14   Derwood Kaplan, MD  nitroGLYCERIN (NITROSTAT) 0.4 MG SL tablet Place 1 tablet (0.4 mg total) under the tongue every 5 (five) minutes as needed for chest pain (CP or SOB). 05/04/14   Su Hoff, MD  spironolactone (ALDACTONE) 25 MG tablet Take 1 tablet (25 mg total) by mouth daily. 08/03/14   Amy D Clegg, NP   BP 119/76 mmHg  Pulse 65  Temp(Src) 98 F (36.7 C) (Oral)  Resp 16  Ht  (1.727 m)  Wt 219 lb (99.338 kg)  BMI 33.31 kg/m2  SpO2 100% Physical Exam  Constitutional: He is oriented to person, place, and time. He appears  well-developed and well-nourished. No distress.  Patient is in no distress. Speaking with a hoarse voice  HENT:  Head: Normocephalic and atraumatic.  Mouth/Throat: Oropharynx is clear and moist. No oropharyngeal exudate.  Mild posterior oropharyngeal erythema.  Eyes: EOM are normal. Pupils are equal, round, and reactive to light.  Neck: Normal range of motion. Neck supple.  Cardiovascular: Normal rate and regular rhythm.  Exam reveals no gallop and no friction rub.   No murmur heard. Pulmonary/Chest: Effort normal and breath sounds normal. No respiratory distress. He has no wheezes. He has no rales. He exhibits tenderness (chest tenderness is completely reproduced with palpation over the bilateral lower chest. There is no crepitance or deformity.).  Abdominal: Soft. Bowel sounds are normal. He exhibits no distension and no mass. There is no tenderness. There is no rebound and no guarding.  Musculoskeletal: Normal range of motion. He exhibits no edema or tenderness.  No lower extremity swelling or pain.  Neurological: He is alert and oriented to person, place, and time.  Moves all extremities without deficit. Sensation is grossly intact.  Skin: Skin is warm and dry. No rash noted. No erythema.  Psychiatric: He has a normal mood and affect. His behavior is normal.  Nursing note and vitals reviewed.   ED Course  Procedures (including critical care time) DIAGNOSTIC STUDIES: Oxygen Saturation is 97% on room air, normal by my interpretation.    COORDINATION OF CARE: 1:16 AM Discussed treatment plan with patient at beside, the patient agrees with the plan and has no further questions at this time.   Labs Review Labs Reviewed  BASIC METABOLIC PANEL - Abnormal; Notable for the following:    Glucose, Bld 117 (*)    BUN 26 (*)    All other components within normal limits  CBC  I-STAT TROPOININ, ED  Rosezena Sensor, ED    Imaging Review Dg Chest 2 View  08/25/2014   CLINICAL DATA:  Mid  chest pain for 1 day.  EXAM: CHEST  2 VIEW  COMPARISON:  Three days prior, 08/21/2014.  FINDINGS: Lower lung volumes from prior exam. Crowding of bronchovascular markings accentuated by low lung volumes. The cardiomediastinal contours are normal. Pulmonary vasculature is normal. No consolidation, pleural effusion, or pneumothorax. No acute osseous abnormalities are seen.  IMPRESSION: Lower lung volumes without localizing process.   Electronically Signed   By: Rubye Oaks M.D.   On: 08/25/2014 00:37     EKG Interpretation   Date/Time:  Wednesday August 24 2014 23:37:01 EDT Ventricular Rate:  85 PR Interval:  170 QRS Duration: 94 QT Interval:  370 QTC Calculation: 440 R Axis:   18 Text Interpretation:  Normal sinus rhythm ST \\T \ T wave abnormality,  consider inferior ischemia Abnormal ECG persistsent ST and T Wave  abnromality since last tracing no  significant change Confirmed by Hyacinth Meeker   MD, BRIAN ([id number]) on 08/24/2014 11:40:14 PM      MDM   Final diagnoses:  URI (upper respiratory infection)  Chest wall pain    I personally performed the services described in this documentation, which was scribed in my presence. The recorded information has been reviewed and is accurate.  T-wave inversions are unchanged from previous EKGs. Cardiac cath on 05/26/14 with normal coronary arteries.  Troponin 2 were normal. Given exam of reproduced chest pain, recent clean cath and normal ED workup fill the patient can be safely discharged home. Low suspicion for coronary artery disease. Appears to be more muscular skeletal chest pain as well as likely URI. Return precautions given.  Loren Racer, MD 08/25/14 [phone]

## 2014-08-31 ENCOUNTER — Encounter: Payer: Self-pay | Admitting: Internal Medicine

## 2014-08-31 ENCOUNTER — Ambulatory Visit (INDEPENDENT_AMBULATORY_CARE_PROVIDER_SITE_OTHER): Payer: Self-pay | Admitting: Internal Medicine

## 2014-08-31 VITALS — BP 141/77 | HR 65 | Temp 98.0°F | Ht 68.0 in | Wt 230.6 lb

## 2014-08-31 DIAGNOSIS — R0602 Shortness of breath: Secondary | ICD-10-CM

## 2014-08-31 DIAGNOSIS — R079 Chest pain, unspecified: Secondary | ICD-10-CM

## 2014-08-31 DIAGNOSIS — J3489 Other specified disorders of nose and nasal sinuses: Secondary | ICD-10-CM

## 2014-08-31 DIAGNOSIS — R05 Cough: Secondary | ICD-10-CM

## 2014-08-31 DIAGNOSIS — R5383 Other fatigue: Secondary | ICD-10-CM

## 2014-08-31 DIAGNOSIS — J209 Acute bronchitis, unspecified: Secondary | ICD-10-CM

## 2014-08-31 DIAGNOSIS — I5022 Chronic systolic (congestive) heart failure: Secondary | ICD-10-CM

## 2014-08-31 MED ORDER — GUAIFENESIN-DM 100-10 MG/5ML PO SYRP: 5.0000 mL | ORAL_SOLUTION | ORAL | Status: DC | PRN

## 2014-08-31 NOTE — Progress Notes (Signed)
Patient ID: Phillip [name], male   DOB: Jul 04, 1965, 49 y.o.   MRN: [id number]   Subjective:   HPI: Phillip [name] is a 49 y.o. gentleman with past medical history below presents for an ED f/u  Reason(s) for visit:  Cough/shortness of breath/chest pain: Patient was seen in emergency department on 08/21/2014 for a cough which had been present for 4 days. He had hoarseness of voice and sore throat. He also complained of increased chest pain. In the ED, he was discharged with a presumptive diagnosis of musculoskeletal chest pain in the setting of a URI. He was discharged on Tessalon and Ibuprofen. He reports that since returning home, he has continued to experience chest pressure, 3/10, constant, non-positional, nonradiating, without palpitations. Denies PND, orthopnea. He reports a cough productive of yellow to clear sputum. He is initial hoarseness of voice has improved. He is concerned about fatigue. His appetite has also been poor. He states that he has been experiencing subjective fevers and sweats. He has some rhinorrhea, but without ear ache or ear drainage. He is hearing has been intact. Also reports some irritation in the eyes without lacrimation.   Past Medical History  Diagnosis Date  . CHF (congestive heart failure)   . Hypertension   . Chronic back pain     "neck down" (07/20/2013)  . GERD (gastroesophageal reflux disease)   . High cholesterol   . Cardiac insufficiency   . Renal calculus     ROS: CVS: No palpitations and leg swelling.  GI: No abdominal pain, nausea, vomiting, bloody stools GU: No dysuria, frequency, hematuria, or flank pain.  MSK: No myalgias, back pain, joint swelling, arthralgias  Psych: No depression symptoms. No SI or SA.    Objective:  Physical Exam: Filed Vitals:   08/31/14 1533  BP: 141/77  Pulse: 65  Temp: 98 F (36.7 C)  TempSrc: Oral  Height: 5\' 8"  (1.727 m)  Weight: 230 lb 9.6 oz (104.599 kg)  SpO2: 99%   General: Well  nourished. No acute distress.  HEENT: Normal oral mucosa. MMM. Clear oropharynx. Ear exam is normal. Lungs: CTA bilaterally. No wheezing.  Heart: RRR; no extra sounds or murmurs. No JVD Abdomen: Non-distended, normal bowel sounds, soft, nontender; no hepatosplenomegaly  Extremities: No pedal edema. No joint swelling or tenderness. Neurologic: Normal EOM,  Alert and oriented x3. No obvious neurologic/cranial nerve deficits.  Assessment & Plan:  Discussed case with Dr Rogelia Boga See problem based charting for assessment and plan.

## 2014-08-31 NOTE — Assessment & Plan Note (Signed)
Volume status is stable. Weight is unchanged at around 222 pounds. Exam without elevation of JVD. The legs without edema. Continue with his current regimen. Follow-up with heart failure clinic.

## 2014-08-31 NOTE — Assessment & Plan Note (Signed)
Assessment: Symptoms are consistent with acute bronchitis; I suspect a viral etiology. I do not suspect pneumonia at this time. His chest x-ray on 08/21/2014 was unremarkable for pneumonia. He is a symptoms have gradually improved. However, he still has some fatigue. I feel that his chest pain and shortness of breath is due to bronchitis as opposed to coronary artery disease or CHF. I reviewed his weight at home is consistently between 221-223 pounds. He meticulously measures his weight every morning and records it down in a book. He is exam is unremarkable for increased fluid overload.   Plan: 1. Labs/imaging: none 2. Therapy: Discussed with the patient about treatment options including the role of anti-biotics. Patient agreed to hold off with antibiotics and see how he does over the next few weeks. I informed the patient that his symptoms are likely to persist for weeks instead of days. Gave him return precautions. 3. Follow up: He will return if symptoms persist, if develops fever, hemoptysis, or increased shortness of breath. Otherwise, follow-up as needed.

## 2014-08-31 NOTE — Patient Instructions (Signed)
General Instructions: Please take robitussin for the cough  Please follow up with your heart doctor Please come back if symptoms persist  Please bring your medicines with you each time you come to clinic.  Medicines may include prescription medications, over-the-counter medications, herbal remedies, eye drops, vitamins, or other pills.   Progress Toward Treatment Goals:  No flowsheet data found.  Self Care Goals & Plans:  Self Care Goal 08/31/2014  Manage my medications take my medicines as prescribed; bring my medications to every visit; refill my medications on time; follow the sick day instructions if I am sick  Monitor my health keep track of my weight  Eat healthy foods eat more vegetables; eat fruit for snacks and desserts  Be physically active find an activity I enjoy    No flowsheet data found.   Care Management & Community Referrals:  No flowsheet data found.     Bronquitis aguda (Acute Bronchitis) La bronquitis es una inflamacin de las vas respiratorias que se extienden desde la trquea [employer] pulmones (bronquios). La inflamacin produce la formacin de mucosidad. Esto produce tos, que es el sntoma ms frecuente de la bronquitis.  Cuando la bronquitis es [place], generalmente comienza de [place] sbita y desaparece luego de un par de semanas. El hbito de fumar, las alergias y el asma pueden empeorar la bronquitis. Los episodios repetidos de bronquitis pueden causar ms problemas pulmonares.  CAUSAS La causa ms frecuente de bronquitis aguda es el mismo virus que produce el resfro. El virus puede propagarse de Neomia Dear persona a la otra (contagioso) a travs de la tos y los estornudos, y al tocar objetos contaminados. SIGNOS Y SNTOMAS   Tos.  Grant Ruts.  Tos con mucosidad.  Dolores [employer] cuerpo.  Congestin en el pecho.  Escalofros.  Falta de aire.  Dolor de Advertising copywriter. DIAGNSTICO  La bronquitis aguda en general se diagnostica con un examen fsico. El mdico  tambin le har preguntas sobre su historia clnica. En algunos casos se indican otros estudios, como radiografas, para Risk manager.  TRATAMIENTO  La bronquitis aguda generalmente desaparece en un par de semanas. Con frecuencia, no es Quar[place] manager. Los medicamentos se indican para aliviar la fiebre o la tos. Generalmente, no es necesario el uso de antibiticos, pero pueden recetarse en ciertas ocasiones. En algunos casos, se recomienda el uso de un inhalador para mejorar la falta de aire y Scientist, physiological tos. Un vaporizador de aire fro podr ayudarlo a [employer] bronquiales y Statistician su eliminacin.  INSTRUCCIONES PARA EL CUIDADO EN EL HOGAR  Descanse lo suficiente.  Beba lquidos en abundancia para mantener la orina de color claro o amarillo plido (excepto que padezca una enfermedad que requiera la restriccin de lquidos). El aumento de lquidos puede ayudar a que las secreciones respiratorias (esputo) sean menos espesas y a reducir la congestin del pecho, y Transport planner deshidratacin.  Tome los medicamentos solamente como se lo haya indicado el mdico.  Si le recetaron antibiticos, asegrese de terminarlos, incluso si comienza a sentirse mejor.  Evite fumar o aspirar el humo de otros fumadores. La exposicin al humo del cigarrillo o a irritantes qumicos har que la bronquitis empeore. Si fuma, considere el uso de goma de Theatre manager o la aplicacin de parches en la piel que contengan nicotina para Paramedic los sntomas de abstinencia. Si deja de fumar, sus pulmones se curarn ms rpido.  Reduzca la probabilidad de otro episodio de bronquitis aguda lavando sus manos con frecuencia, evitando a  las personas que tengan sntomas y tratando de no tocarse las manos con la boca, la nariz o los ojos.  Concurra a todas las visitas de control como se lo haya indicado el mdico. SOLICITE ATENCIN MDICA SI: Los sntomas no mejoran despus de una semana  de [place].  SOLICITE ATENCIN MDICA DE INMEDIATO SI:  Comienza a tener fiebre o escalofros cada vez ms intensos.  Siente dolor en el pecho.  Le falta el aire de manera preocupante.  La flema tiene [place].  Se deshidrata.  Se desmaya o siente que va a desmayarse de forma repetida.  Tiene vmitos que se repiten.  Tiene un dolor de cabeza intenso. ASEGRESE DE QUE:   Comprende estas instrucciones.  Controlar su afeccin.  Recibir ayuda de inmediato si no mejora o si empeora. Document Released: 03/11/2005 Document Revised: 07/26/2013 [facility] Patient Information 2015 [place], [place]. This information is not intended to replace advice given to you by your health care provider. Make sure you discuss any questions you have with your health care provider.

## 2014-09-01 ENCOUNTER — Ambulatory Visit: Payer: Self-pay | Admitting: Internal Medicine

## 2014-09-01 NOTE — Progress Notes (Signed)
Internal Medicine Clinic Attending  Case discussed with Dr. Kazibwe soon after the resident saw the patient.  We reviewed the resident's history and exam and pertinent patient test results.  I agree with the assessment, diagnosis, and plan of care documented in the resident's note. 

## 2014-09-02 ENCOUNTER — Encounter: Payer: Self-pay | Admitting: Internal Medicine

## 2014-09-02 ENCOUNTER — Other Ambulatory Visit: Payer: Self-pay | Admitting: *Deleted

## 2014-09-05 MED ORDER — CARVEDILOL 25 MG PO TABS: 25.0000 mg | ORAL_TABLET | Freq: Two times a day (BID) | ORAL | Status: DC

## 2014-09-05 MED ORDER — SPIRONOLACTONE 25 MG PO TABS: 25.0000 mg | ORAL_TABLET | Freq: Every day | ORAL | Status: DC

## 2014-09-05 MED ORDER — AMLODIPINE BESYLATE 5 MG PO TABS: 5.0000 mg | ORAL_TABLET | Freq: Every day | ORAL | Status: DC

## 2014-09-05 MED ORDER — FUROSEMIDE 20 MG PO TABS: 20.0000 mg | ORAL_TABLET | Freq: Every day | ORAL | Status: DC

## 2014-09-07 ENCOUNTER — Encounter: Payer: Self-pay | Admitting: Internal Medicine

## 2014-10-04 ENCOUNTER — Encounter: Payer: Self-pay | Admitting: Internal Medicine

## 2014-10-06 ENCOUNTER — Other Ambulatory Visit: Payer: Self-pay | Admitting: *Deleted

## 2014-10-06 DIAGNOSIS — J209 Acute bronchitis, unspecified: Secondary | ICD-10-CM

## 2014-10-06 MED ORDER — MECLIZINE HCL 25 MG PO TABS: 25.0000 mg | ORAL_TABLET | Freq: Three times a day (TID) | ORAL | Status: DC | PRN

## 2014-10-06 NOTE — Telephone Encounter (Signed)
Will refill meclizine X 1 for vertigo, but it appears his symptoms have been persistent since late May early June.  Prior to further refills he should be seen asap with interpreter for persistent URI symptoms.   Thanks

## 2014-10-07 ENCOUNTER — Encounter: Payer: Self-pay | Admitting: Internal Medicine

## 2014-10-10 ENCOUNTER — Other Ambulatory Visit: Payer: Self-pay | Admitting: Internal Medicine

## 2014-10-13 ENCOUNTER — Encounter: Payer: Self-pay | Admitting: Internal Medicine

## 2014-11-09 ENCOUNTER — Encounter: Payer: Self-pay | Admitting: Internal Medicine

## 2014-12-02 ENCOUNTER — Telehealth: Payer: Self-pay | Admitting: Internal Medicine

## 2014-12-02 NOTE — Telephone Encounter (Signed)
Call to patient to confirm appointment for 12/05/14 at 1:30 lmtcb

## 2014-12-05 ENCOUNTER — Encounter: Payer: Self-pay | Admitting: Internal Medicine

## 2014-12-22 ENCOUNTER — Encounter: Payer: Self-pay | Admitting: Internal Medicine

## 2014-12-22 ENCOUNTER — Other Ambulatory Visit: Payer: Self-pay | Admitting: *Deleted

## 2014-12-25 MED ORDER — ENALAPRIL MALEATE 20 MG PO TABS: 20.0000 mg | ORAL_TABLET | Freq: Two times a day (BID) | ORAL | Status: DC

## 2014-12-29 ENCOUNTER — Ambulatory Visit (INDEPENDENT_AMBULATORY_CARE_PROVIDER_SITE_OTHER): Payer: Self-pay | Admitting: Internal Medicine

## 2014-12-29 ENCOUNTER — Encounter: Payer: Self-pay | Admitting: Internal Medicine

## 2014-12-29 VITALS — BP 149/77 | HR 60 | Wt 226.6 lb

## 2014-12-29 DIAGNOSIS — K0889 Other specified disorders of teeth and supporting structures: Secondary | ICD-10-CM | POA: Insufficient documentation

## 2014-12-29 DIAGNOSIS — Z0000 Encounter for general adult medical examination without abnormal findings: Secondary | ICD-10-CM | POA: Insufficient documentation

## 2014-12-29 DIAGNOSIS — Z23 Encounter for immunization: Secondary | ICD-10-CM

## 2014-12-29 DIAGNOSIS — I5022 Chronic systolic (congestive) heart failure: Secondary | ICD-10-CM

## 2014-12-29 MED ORDER — IBUPROFEN 600 MG PO TABS: 600.0000 mg | ORAL_TABLET | Freq: Three times a day (TID) | ORAL | Status: DC | PRN

## 2014-12-29 MED ORDER — FUROSEMIDE 40 MG PO TABS: 40.0000 mg | ORAL_TABLET | Freq: Every day | ORAL | Status: DC

## 2014-12-29 NOTE — Assessment & Plan Note (Signed)
Minimally volume overloaded on exam, with some abdominal distension. He was supposed to have a follow-up echo in 2 months from a May 2016 appt from the heart failure clinic, but this was never performed. Etiology of CHF is unclear. A cMRI was suggested to evaluate for [place], but the patient declined due to claustrophobia. - Increased furosemide to 40 mg daily - Ordered TTE - Will arrange follow-up with heart failure clinic.

## 2014-12-29 NOTE — Patient Instructions (Signed)
[name], it was a pleasure meeting you today. We are increasing you furosemide to 40 mg a day. We are scheduling you to have a picture of your heart to see how your heart failure is doing. I will give you a phone number for a Spanish-speaking dentist for your tooth pain. You can take 200 mg of Advil 3 times a day for tooth pain.   Insuficiencia cardaca  (Heart Failure)  Insuficiencia cardaca significa que el corazn tiene problemas para bombear la [place]. Esto dificulta el buen funcionamiento del organismo. La insuficiencia cardaca es una enfermedad de larga duracin (crnica). Es importante que se cuide mucho y que siga el plan de tratamiento que le indique el mdico. CUIDADOS EN EL [employer] medicamentos para el corazn tal como se los prescribi el mdico.  No deje de tomar medicamentos excepto que su mdico lo indique  No se saltee ninguna dosis del medicamento.  Provase de los medicamentos antes de que se acaben.  Tome medicamentos slo como lo indique su mdico o Social research officer, government.  Permanezca activo si el mdico lo indica. Las Armed forces logistics/support/administrative officer y los que tengan insuficiencia cardaca grave deben hablar con el mdico acerca de la actividad fsica.  Consuma alimentos saludables para el corazn. Elija alimentos que no contengan grasas trans y sean bajas en grasas saturadas, colesterol y sal (sodio). Esto incluye frutas frescas o congeladas y vegetales, pescado, carnes Rover, productos lcteos sin grasa o bajos en grasa, granos enteros y alimentos ricos en fibra. Las lentejas, arvejas y frijoles (legumbres) son tambin buenas opciones.  Limitar el consumo de sal segn lo aconsejado por su mdico.  Cocine en forma saludable. Prepare los alimentos asados, a la parrilla, al horno, hervidos, al vapor o salteados.  Limite los lquidos segn lo aconsejado por su mdico.  Controle su peso todas las [place]. Hgalo despus de hacer pis (orinar) y antes de tomar el desayuno.  Anote su peso para llevarlo a la consulta con el mdico.  Tmese la presin arterial y antela, si su mdico se lo indica.  Pregunte al mdico como controlarse el pulso. Controle su pulso segn las indicaciones.  Baje de peso si el mdico se lo indica.  Deje de fumar o mascar tabaco. No use goma de Theatre manager o parches para dejar de fumar sin la aprobacin de su mdico.  Programe y concurra a las citas con el mdico segn lo indicado.  Las mujeres no embarazadas no deben tomar ms de 1 bebida al [employer]. Los hombres no deben tomar ms de 2 bebidas al [employer]. Hable con su mdico acerca de su consumo de alcohol.  No consuma drogas.  Mantngase al da con las vacunas (immunizaciones).  Controle sus enfermedades segn lo indicado por su mdico.  Aprenda a Dealer.  Descanse cuando se sienta cansado.  Si hace mucho calor en el exterior:  Evite las actividades intensas.  Utilice aire acondicionado o ventiladores, o pngase en un lugar ms fresco.  Evite la cafena y el alcohol.  Use ropa holgada, ligera y de colores claros.  Si hace mucho fro en el exterior:  Evite las actividades intensas.  Vstase con ropa en capas.  Use mitones o guantes, un sombrero y [place] bufanda cuando salga.  Evite el alcohol.  Aprenda todo sobre la insuficiencia cardaca y [place] apoyo si lo necesita.  Obtenga ayuda para mantener o mejorar su calidad de vida y su capacidad para cuidarse a s mismo, si lo necesita. SOLICITE AYUDA SI:  Aumenta de peso rpidamente.  Le falta el aire ms que lo habitual.  No puede hacer sus actividades habituales.  Se cansa con facilidad.  Tose ms de lo normal, especialmente al realizar actividad fsica.  Observa que se le hinchan o le aumenta la hinchazn (inflamacin) en reas como las manos, los pies, los tobillos o el vientre (abdomen).  Le cuesta dormir debido a que Engineer, manufacturing systems.  Siente que el corazn palpita rpido  (palpitaciones).  Siente mareos o vahdos al pararse. SOLICITE AYUDA DE INMEDIATO SI:   Tiene dificultad para respirar.  Hay un cambio en su estado mental, como estar menos alerta o no poder concentrarse.  Siente dolor u opresin en el pecho.  Se desmaya. ASEGRESE DE QUE:   Comprende estas instrucciones.  Controlar su enfermedad.  Solicitar ayuda de inmediato si no mejora o si empeora.   Esta informacin no tiene Theme park manager el consejo del mdico. Asegrese de hacerle al mdico cualquier pregunta que tenga.   Document Released: 06/07/2008 Document Revised: 04/01/2014 Elsevier Interactive Patient Education [employer]

## 2014-12-29 NOTE — Assessment & Plan Note (Signed)
We cannot make a referral to our dental clinics due to lack of insurance. I have provided him the number of a Spanish speaking dentist in [place] who may be able to help him. I advised him to seek medical attention should he develop a fever or if there is new swelling in his mouth or jaw.

## 2014-12-29 NOTE — Progress Notes (Signed)
   Subjective:    Patient ID: Phillip [name], male    DOB: 06-Dec-1965, 49 y.o.   MRN: [id number]  HPI  [name] is a 49 yo with a PMH of CHF (25-30%) who comes to the clinic for a general medical checkup and to evaluate his tooth pain. History was obtained via a teleconferenced interpreter. He says he feels like his tooth "cracked' several weeks ago. He has not noticed any swelling or fever. He had been taking some tylenol for the tooth pain, which was only minimally effective. He does not regularly go to a dentist due to lack of insurance and language barriers.   Review of Systems  Constitutional: Negative for fever and fatigue.  HENT: Positive for dental problem. Negative for mouth sores and sore throat.        Right, superior molar pain.  Respiratory: Negative for chest tightness and shortness of breath.   Cardiovascular: Negative for chest pain, palpitations and leg swelling.  Gastrointestinal: Positive for abdominal distention. Negative for abdominal pain.  Genitourinary: Negative for decreased urine volume.  Neurological: Negative for syncope and light-headedness.  Psychiatric/Behavioral: Negative for dysphoric mood.       Objective:   Physical Exam  Constitutional: He appears well-developed and well-nourished. No distress.  HENT:  Mouth/Throat: Oropharynx is clear and moist.  No significant oral abnormalities. No swelling or damage noted near any molars. No sores or abscesses.  Eyes:  Corneal clouding on right with strabismus. He wears on eye patch.  Neck: No JVD present.  Cardiovascular: Normal rate and regular rhythm.   No murmur heard. Pulmonary/Chest: Effort normal and breath sounds normal. No respiratory distress. He has no wheezes.  No crackles heard.  Abdominal: Soft. Bowel sounds are normal. He exhibits distension.  Minimal distension. No fluid wave.  Neurological: He is alert. He displays normal reflexes.  Skin: Skin is warm and dry.  Psychiatric:  He has a normal mood and affect.  Vitals reviewed.         Assessment & Plan:  See problem-based assessment and plan for details.

## 2014-12-30 LAB — HIV ANTIBODY (ROUTINE TESTING W REFLEX): HIV Screen 4th Generation wRfx: NONREACTIVE

## 2015-01-06 NOTE — Progress Notes (Signed)
Internal Medicine Clinic Attending  I saw and evaluated the patient.  I personally confirmed the key portions of the history and exam documented by Dr. Ford and I reviewed pertinent patient test results.  The assessment, diagnosis, and plan were formulated together and I agree with the documentation in the resident's note. 

## 2015-03-02 ENCOUNTER — Encounter: Payer: Self-pay | Admitting: Internal Medicine

## 2015-03-02 ENCOUNTER — Telehealth: Payer: Self-pay

## 2015-03-02 ENCOUNTER — Ambulatory Visit (INDEPENDENT_AMBULATORY_CARE_PROVIDER_SITE_OTHER): Payer: Self-pay | Admitting: Internal Medicine

## 2015-03-02 VITALS — BP 117/68 | HR 60 | Temp 98.2°F | Wt 223.0 lb

## 2015-03-02 DIAGNOSIS — E785 Hyperlipidemia, unspecified: Secondary | ICD-10-CM

## 2015-03-02 DIAGNOSIS — R7303 Prediabetes: Secondary | ICD-10-CM

## 2015-03-02 DIAGNOSIS — N521 Erectile dysfunction due to diseases classified elsewhere: Secondary | ICD-10-CM

## 2015-03-02 DIAGNOSIS — N529 Male erectile dysfunction, unspecified: Secondary | ICD-10-CM | POA: Insufficient documentation

## 2015-03-02 DIAGNOSIS — Z0000 Encounter for general adult medical examination without abnormal findings: Secondary | ICD-10-CM

## 2015-03-02 DIAGNOSIS — Z23 Encounter for immunization: Secondary | ICD-10-CM

## 2015-03-02 LAB — GLUCOSE, CAPILLARY: Glucose-Capillary: 111 mg/dL — ABNORMAL HIGH (ref 65–99)

## 2015-03-02 LAB — POCT GLYCOSYLATED HEMOGLOBIN (HGB A1C): Hemoglobin A1C: 5.8

## 2015-03-02 MED ORDER — SPIRONOLACTONE 25 MG PO TABS: 12.5000 mg | ORAL_TABLET | Freq: Every day | ORAL | Status: DC

## 2015-03-02 MED ORDER — ATORVASTATIN CALCIUM 40 MG PO TABS: 40.0000 mg | ORAL_TABLET | Freq: Every day | ORAL | Status: DC

## 2015-03-02 MED ORDER — SILDENAFIL CITRATE 50 MG PO TABS: 50.0000 mg | ORAL_TABLET | ORAL | Status: DC | PRN

## 2015-03-02 NOTE — Addendum Note (Signed)
Addended by: Maura Crandall on: 03/02/2015 11:05 AM   Modules accepted: Orders

## 2015-03-02 NOTE — Assessment & Plan Note (Signed)
Given his complaints of new onset erectile dysfunction, I elected to screen him for diabetes again with an A1c. It was 5.8 today.

## 2015-03-02 NOTE — Assessment & Plan Note (Signed)
He just turned 35, so he is due for colorectal cancer screening. I provided him stool cards at this visit. He also got his DTaP today.

## 2015-03-02 NOTE — Progress Notes (Signed)
Patient ID: Phillip [name], male   DOB: 1965-05-14, 49 y.o.   MRN: [id number]   Subjective:   Patient ID: Phillip [name] male   DOB: June 09, 1965 49 y.o.   MRN: [id number]  HPI: [name] is a 49 y.o. 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  He states he has been feeling very well recently; specifically denying and angina, orthopnea, or lower extremity edema. He says he has difficulty sustaining an erection recently, and has been having less frequent erections at night. This started about 1 year ago shortly after he began taking carvedilol and spironolactone. He says this is impacting his life greatly. He is no longer married to his wife, and has sex about once a week, but he would like to have sex more often.  Please see the assessment and plan for the status of the patient's chronic medical problems.  Past Medical History  Diagnosis Date  . CHF (congestive heart failure) (HCC)   . Hypertension   . Chronic back pain     "neck down" (07/20/2013)  . GERD (gastroesophageal reflux disease)   . High cholesterol   . Cardiac insufficiency (HCC)   . Renal calculus    Current Outpatient Prescriptions  Medication Sig Dispense Refill  . amLODipine (NORVASC) 5 MG tablet Take 1 tablet (5 mg total) by mouth daily. 90 tablet 3  . benzonatate (TESSALON) 100 MG capsule Take 1 capsule (100 mg total) by mouth every 8 (eight) hours. (Patient not taking: Reported on 12/29/2014) 21 capsule 0  . carvedilol (COREG) 25 MG tablet Take 1 tablet (25 mg total) by mouth 2 (two) times daily with a meal. 180 tablet 3  . enalapril (VASOTEC) 20 MG tablet Take 1 tablet (20 mg total) by mouth 2 (two) times daily. 180 tablet 3  . furosemide (LASIX) 40 MG tablet Take 1 tablet (40 mg total) by mouth daily. 30 tablet 4  . guaiFENesin-dextromethorphan (ROBITUSSIN DM)  100-10 MG/5ML syrup Take 5 mLs by mouth every 4 (four) hours as needed for cough. (Patient not taking: Reported on 12/29/2014) 118 mL 1  . ibuprofen (ADVIL,MOTRIN) 600 MG tablet Take 1 tablet (600 mg total) by mouth 3 (three) times daily with meals as needed. 30 tablet 0  . meclizine (ANTIVERT) 25 MG tablet Take 1 tablet (25 mg total) by mouth 3 (three) times daily as needed for dizziness. (Patient not taking: Reported on 12/29/2014) 28 tablet 0  . nitroGLYCERIN (NITROSTAT) 0.4 MG SL tablet Place 1 tablet (0.4 mg total) under the tongue every 5 (five) minutes as needed for chest pain (CP or SOB). (Patient not taking: Reported on 12/29/2014) 30 tablet 3  . prednisoLONE acetate (PRED FORTE) 1 % ophthalmic suspension One drop, right eye  3x/day    . spironolactone (ALDACTONE) 25 MG tablet Take 1 tablet (25 mg total) by mouth daily. 90 tablet 3   No current facility-administered medications for this visit.   No family history on file. Social History   Social History  . Marital Status: Married    Spouse Name: N/A  . Number of Children: N/A  . Years of Education: N/A   Social History Main Topics  . Smoking status: Never Smoker   . Smokeless tobacco: Never Used  . Alcohol Use: No  . Drug Use: No  . Sexual Activity: Yes   Other Topics Concern  . Not on file  Social History Narrative   Review of Systems  Constitutional: Negative for fever, chills, weight loss and malaise/fatigue.  Eyes: Negative for blurred vision and double vision.  Respiratory: Negative for cough and shortness of breath.   Cardiovascular: Negative for chest pain, palpitations, orthopnea and leg swelling.  Gastrointestinal: Negative for abdominal pain.  Skin: Negative for rash.  Neurological: Negative for dizziness, focal weakness and headaches.   Objective:  Physical Exam: Filed Vitals:   03/02/15 0912  BP: 117/68  Pulse: 60  Temp: 98.2 F (36.8 C)  TempSrc: Oral  Weight: 223 lb (101.152 kg)  SpO2: 100%    General: resting in chair comfortably, avoiding eye contact and appearing sad when discussing his erectile dysfunction, but otherwise appropriately conversational HEENT: no scleral icterus, extra-ocular muscles intact, oropharynx without lesions Cardiac: regular rate and rhythm, no rubs, murmurs or gallops Pulm: breathing well, clear to auscultation bilaterally Abd: bowel sounds normal, soft, nondistended, non-tender Ext: warm and well perfused, without pedal edema Lymph: no cervical or supraclavicular lymphadenopathy Skin: no rash, hair, or nail changes Neuro: alert and oriented X3, cranial nerves II-XII grossly intact, moving all extremities well  Assessment & Plan:   Please see problem-based charting for my assessment and plan.

## 2015-03-02 NOTE — Assessment & Plan Note (Signed)
He tells me he has had difficulty sustaining an erection for the last year or so, and this is very distressing to him. He relates this to taking the new medications for his heart failure, and I agree with his assessment. His heart failure is nonischemic, he doesn't have other signs of atherosclerosis, and he still endorses nocturnal erections. Therefore, I believe this is not due to atherosclerosis, but primarily drug-related, specifically his carvedilol and spironolactone. I have halved his dose of spironolactone to 12.5 mg daily, and written him a prescription for sildenafil. He has not used the nitroglycerin since March. I've discontinued this medication and told him to throw the pills away, so long as he is taking the Viagra. At the next visit, I like to talk more about his mood and screen for depression with a PHQ9. If he continues to have erectile dysfunction despite using sildenafil, and he is adequately beta blocked, we may consider having his dose of carvedilol.

## 2015-03-02 NOTE — Assessment & Plan Note (Signed)
His calculated ASCVD score is greater than 10%; however, primary prevention with statin use in patients with heart failure and severe renal disease has not shown positive results in the literature. Additionally, his heart failure is nonischemic, and he has no signs of atherosclerosis. I believe his erectile dysfunction is primarily medication induced and not related to atherosclerosis. Therefore, we have decided to defer starting a statin for the time being.

## 2015-03-02 NOTE — Progress Notes (Signed)
Internal Medicine Clinic Attending  I saw and evaluated the patient.  I personally confirmed the key portions of the history and exam documented by Dr. Earnest Conroy and I reviewed pertinent patient test results.  The assessment, diagnosis, and plan were formulated together and I agree with the documentation in the resident's note.  Patient seen today for a chief complaint of erectile dysfunction. This is a new finding, lasting for less than one year. I think the etiology is most likely medication effect, either from spironolactone or carvedilol. His CHF is stable and doing well. The ED is significantly affecting his quality of life, he was tearful during our interview. We decided to try decreasing Cleda Daub to 12.5mg  daily and see if that helps. Next time we can think about decreasing Carvedilol. Will try sildenafil as well.

## 2015-03-09 ENCOUNTER — Other Ambulatory Visit (INDEPENDENT_AMBULATORY_CARE_PROVIDER_SITE_OTHER): Payer: Self-pay

## 2015-03-09 ENCOUNTER — Other Ambulatory Visit: Payer: Self-pay | Admitting: Internal Medicine

## 2015-03-09 DIAGNOSIS — Z0000 Encounter for general adult medical examination without abnormal findings: Secondary | ICD-10-CM

## 2015-03-09 DIAGNOSIS — Z1211 Encounter for screening for malignant neoplasm of colon: Secondary | ICD-10-CM

## 2015-03-09 LAB — POC HEMOCCULT BLD/STL (HOME/3-CARD/SCREEN)
Card #3 Fecal Occult Blood, POC: NEGATIVE
FECAL OCCULT BLD: NEGATIVE
FECAL OCCULT BLD: NEGATIVE

## 2015-03-10 ENCOUNTER — Encounter: Payer: Self-pay | Admitting: Internal Medicine

## 2015-03-14 NOTE — Telephone Encounter (Signed)
Encounter opened by mistake. closing

## 2015-03-21 ENCOUNTER — Encounter: Payer: Self-pay | Admitting: Internal Medicine

## 2015-04-03 ENCOUNTER — Encounter: Payer: Self-pay | Admitting: Internal Medicine

## 2015-04-04 ENCOUNTER — Ambulatory Visit (HOSPITAL_COMMUNITY)
Admission: RE | Admit: 2015-04-04 | Discharge: 2015-04-04 | Disposition: A | Discharge status: Home / Self Care | Payer: BLUE CROSS/BLUE SHIELD | Source: Ambulatory Visit | Attending: Internal Medicine | Admitting: Internal Medicine

## 2015-04-04 ENCOUNTER — Encounter (HOSPITAL_COMMUNITY): Payer: Self-pay | Admitting: Internal Medicine

## 2015-04-04 VITALS — BP 106/58 | HR 68 | Wt 219.5 lb

## 2015-04-04 DIAGNOSIS — E785 Hyperlipidemia, unspecified: Secondary | ICD-10-CM | POA: Insufficient documentation

## 2015-04-04 DIAGNOSIS — I5022 Chronic systolic (congestive) heart failure: Secondary | ICD-10-CM | POA: Diagnosis present

## 2015-04-04 DIAGNOSIS — I10 Essential (primary) hypertension: Secondary | ICD-10-CM | POA: Insufficient documentation

## 2015-04-04 DIAGNOSIS — Z79899 Other long term (current) drug therapy: Secondary | ICD-10-CM | POA: Diagnosis not present

## 2015-04-04 DIAGNOSIS — K219 Gastro-esophageal reflux disease without esophagitis: Secondary | ICD-10-CM | POA: Insufficient documentation

## 2015-04-04 DIAGNOSIS — I429 Cardiomyopathy, unspecified: Secondary | ICD-10-CM | POA: Insufficient documentation

## 2015-04-04 LAB — BASIC METABOLIC PANEL
Anion gap: 9 (ref 5–15)
BUN: 28 mg/dL — AB (ref 6–20)
CHLORIDE: 108 mmol/L (ref 101–111)
CO2: 25 mmol/L (ref 22–32)
CREATININE: 1.17 mg/dL (ref 0.61–1.24)
Calcium: 8.9 mg/dL (ref 8.9–10.3)
GFR calc Af Amer: >60 mL/min (ref >60)
GFR calc non Af Amer: >60 mL/min (ref >60)
Glucose, Bld: 102 mg/dL — ABNORMAL HIGH (ref 65–99)
Potassium: 3.8 mmol/L (ref 3.5–5.1)
SODIUM: 142 mmol/L (ref 135–145)

## 2015-04-04 NOTE — Progress Notes (Signed)
ADVANCED HF CLINIC NOTE  Patient ID: MENDEL BINSFELD, male   DOB: 01-Sep-1965, 50 y.o.   MRN: [id number] PCP: Dr Burtis Junes  50 yo with history of HTN and cardiomyopathy presents for cardiology evaluation.  He has been hypertensive since age 15.  He is from [place] and moved here about 3 years ago.  He does not speak [place].  In 2014, he apparently had chest pain and had a stress PET at [facility].  Cardiac cath at that time did not show obstructive disease.  He had an echo here in 4/15 that showed normal EF, 55-60%.  In 2/16, he was admitted twice with chest pain.  Both times, symptoms were thought to be atypical for ischemia. During the first admission, there was one troponin of 0.19 while others were normal.  During the second admission, there was one troponin to 0.05 while the others were normal.  Echo was done in 2/16, showing EF down to 30-35% with inferior and inferoseptal wall motion abnormality.  He underwent coronary angiography on 05/26/14 with normal coronaries, LVEF 35-40%  and LVEDP 26. Lasix was increased.   After cMRi was ordered to look for possible [place] CM. But he could not complete due to severe claustrophobia even with valium.   He returns for HF follow up -Interpreter present.  We have not seen him since this spring. Says he is doing very well. No dyspnea, orthopnea or PND. No swelling.  Working full-time in [employer] at nights. Only gets SOB with vigorous activity or a lot of steps.  Labs (2/16): HIV negative, K 3.6, creatinine 1.09, hemoglobin 13.3 Labs (3/16/201): Creatinine 0.95 Labs (06/22/2014): K 3.8 Creatinine 1.07 Labs 07/13/2014; K 4.2 Creatinine 1.07   PMH: 1. HTN: Diagnosed at age 50. 2. GERD 3. Hyperlipidemia 4. Nephrolithiasis 5. Cardiomyopathy: 2014 abnormal stress PET at [facility], had catheterization that per report showed no significant coronary disease.  Echo (4/15) with EF 55-60%.  Echo (2/16) with EF 30-35%, akinesis of the basal to mid inferior and inferoseptal walls,  apex poorly visualized, restrictive diastolic function, mild MR.  HIV negative in 2/16.   SH: Married, from [place], speaks only [place], works in [employer], nonsmoker, occasional ETOH.   FH: No h/o cardiomyopathy or CAD that he knows of.  Mother and sister with HTN.  ROS: All systems reviewed and negative except as per HPI.   Current Outpatient Prescriptions  Medication Sig Dispense Refill  . amLODipine (NORVASC) 5 MG tablet Take 1 tablet (5 mg total) by mouth daily. 90 tablet 3  . atorvastatin (LIPITOR) 40 MG tablet Take 1 tablet (40 mg total) by mouth daily. 30 tablet 11  . carvedilol (COREG) 25 MG tablet Take 1 tablet (25 mg total) by mouth 2 (two) times daily with a meal. 180 tablet 3  . enalapril (VASOTEC) 20 MG tablet Take 1 tablet (20 mg total) by mouth 2 (two) times daily. 180 tablet 3  . furosemide (LASIX) 40 MG tablet Take 1 tablet (40 mg total) by mouth daily. 30 tablet 4  . prednisoLONE acetate (PRED FORTE) 1 % ophthalmic suspension One drop, right eye  3x/day    . spironolactone (ALDACTONE) 25 MG tablet Take 0.5 tablets (12.5 mg total) by mouth daily. 90 tablet 3   No current facility-administered medications for this encounter.   BP 106/58 mmHg  Pulse 68  Wt 219 lb 8 oz (99.565 kg)  SpO2 97%' General: NAD Neck: No JVD, no thyromegaly or thyroid nodule.  Lungs: Clear to auscultation bilaterally with  normal respiratory effort. CV: Nondisplaced PMI.  Heart regular S1/S2, no S3/S4, 2/6 SEM RSB  No edema.  No carotid bruit.  Normal pedal pulses.  Abdomen: obese.s oft, nontender, no hepatosplenomegaly, no distention.  Skin: Intact without lesions or rashes.  Neurologic: Alert and oriented x 3.  Psych: Normal affect. Extremities: No clubbing or cyanosis.  HEENT: Normal.   Assessment/Plan: 1. Chronic systolic CHF: Echo (2/16) with EF 30-35%, down from 55-60% about a year ago.  Cath 05/26/14 normal coronaries EF 35-40%  Etiology of cardiomyopathy is uncertain. Likely  HTNive, --I did bedside echo today personally. EF 55-60% (suspect EF improved with improved BP control) --NYHA class I-II symptoms.  --Volume status ok  --Ideally would switch to [facility] but has difficulty with paying for medications. HF meds provided through HF fund. Has applied for Obamacare and waiting for card. Will continue enalapril for now. EF has recovered.  - Will see back in 4 months with formal echo. - Reinforced need for daily weights and reviewed use of sliding scale diuretics. - Check BMET today  2. HTN: BP much improved   [name]  MD  04/04/2015

## 2015-04-04 NOTE — Patient Instructions (Signed)
Lab today  Your physician recommends that you schedule a follow-up appointment in: 4 months with echocardiogram

## 2015-04-18 ENCOUNTER — Telehealth: Payer: Self-pay

## 2015-04-18 DIAGNOSIS — R059 Cough, unspecified: Secondary | ICD-10-CM

## 2015-04-18 DIAGNOSIS — R05 Cough: Secondary | ICD-10-CM

## 2015-04-18 NOTE — Congregational Nurse Program (Signed)
Congregational Nurse Program Note  Date of Encounter: 04/18/2015  Past Medical History: Past Medical History  Diagnosis Date  . CHF (congestive heart failure) (HCC)   . Hypertension   . Chronic back pain     "neck down" (07/20/2013)  . GERD (gastroesophageal reflux disease)   . High cholesterol   . Cardiac insufficiency (HCC)   . Renal calculus     Encounter Details:     CNP Questionnaire - 04/18/15 1537    Patient Demographics   Is this a new or existing patient? New   Patient is considered a/an Immigrant   Race Latino/Hispanic   Patient Assistance   Location of Patient Assistance [facility]   Patient's financial/insurance status Affordable Care Act   Uninsured Patient Yes   Interventions Counseled to make appt. with provider  if not better   Patient referred to apply for the following financial assistance Not Applicable   Food insecurities addressed Not Applicable   Transportation assistance No   Assistance securing medications Yes  call to Int. Med Clinic for advise of which OTC to purchase   Educational health offerings Hypertension;Cardiac disease   Encounter Details   Primary purpose of visit Acute Illness/Condition Visit  cough.  wants cough medicine   Was an Emergency Department visit averted? Not Applicable   Does patient have a medical provider? Yes   Patient referred to Other (comment)  pharmacy   Was a mental health screening completed? (GAINS tool) No   Does patient have dental issues? No   Since previous encounter, have you referred patient for abnormal blood pressure that resulted in a new diagnosis or medication change? No   Since previous encounter, have you referred patient for abnormal blood glucose that resulted in a new diagnosis or medication change? No       Vs 112/62 pulse 67   Pt. Comes in to get a script for cough med.  RN advises she cannot prescribe cough medicines suggested he try OTC but with the approval of his PCP.  RN called  Internal Medicine Clinic at pts. Request. Per Nedra Hai in triage this pt. has taken Robitussin in the past.  He was offered an appt. but pt. states he already has an appt. on Jan. 30th. He will keep that one and buy the Robitussin to take to calm his cough especially at night.

## 2015-04-18 NOTE — Telephone Encounter (Signed)
Rec'd call from [place] RN with [facility] [facility], pt complaining of cough, worse at night, and wants to know what he can take OTC.  Pt declined appointment any sooner than 1/30.  Medication history includes Robitussin-DM which pt is able to get OTC.  I advised RN that it would be best for pt to be seen, however I can advise PCP of this call.  He will keep his appointment on 1/30 (works 12 hour night shifts)

## 2015-04-24 ENCOUNTER — Ambulatory Visit (INDEPENDENT_AMBULATORY_CARE_PROVIDER_SITE_OTHER): Payer: BLUE CROSS/BLUE SHIELD | Admitting: Internal Medicine

## 2015-04-24 ENCOUNTER — Encounter: Payer: Self-pay | Admitting: Internal Medicine

## 2015-04-24 VITALS — BP 132/65 | HR 64 | Temp 97.8°F | Wt 218.9 lb

## 2015-04-24 DIAGNOSIS — I110 Hypertensive heart disease with heart failure: Secondary | ICD-10-CM | POA: Diagnosis not present

## 2015-04-24 DIAGNOSIS — I5022 Chronic systolic (congestive) heart failure: Secondary | ICD-10-CM | POA: Diagnosis not present

## 2015-04-24 DIAGNOSIS — N521 Erectile dysfunction due to diseases classified elsewhere: Secondary | ICD-10-CM

## 2015-04-24 DIAGNOSIS — Z6833 Body mass index (BMI) 33.0-33.9, adult: Secondary | ICD-10-CM

## 2015-04-24 DIAGNOSIS — N528 Other male erectile dysfunction: Secondary | ICD-10-CM

## 2015-04-24 DIAGNOSIS — E669 Obesity, unspecified: Secondary | ICD-10-CM | POA: Diagnosis not present

## 2015-04-24 DIAGNOSIS — I10 Essential (primary) hypertension: Secondary | ICD-10-CM

## 2015-04-24 MED ORDER — SPIRONOLACTONE 25 MG PO TABS: 12.5000 mg | ORAL_TABLET | Freq: Every day | ORAL | Status: DC

## 2015-04-24 MED ORDER — ENALAPRIL MALEATE 20 MG PO TABS: 20.0000 mg | ORAL_TABLET | Freq: Two times a day (BID) | ORAL | Status: DC

## 2015-04-24 MED ORDER — FUROSEMIDE 40 MG PO TABS: 40.0000 mg | ORAL_TABLET | Freq: Every day | ORAL | Status: DC

## 2015-04-24 MED ORDER — CARVEDILOL 25 MG PO TABS: 25.0000 mg | ORAL_TABLET | Freq: Two times a day (BID) | ORAL | Status: DC

## 2015-04-24 MED ORDER — AMLODIPINE BESYLATE 5 MG PO TABS: 5.0000 mg | ORAL_TABLET | Freq: Every day | ORAL | Status: DC

## 2015-04-24 NOTE — Assessment & Plan Note (Signed)
His blood pressure continues to be well controlled today. I've refilled some of his medications.

## 2015-04-24 NOTE — Progress Notes (Signed)
[facility] Subjective:   Patient ID: Phillip [name] male   DOB: 1965-04-07 50 y.o.   MRN: [id number]  HPI: Phillip [name] is a 50 y.o. 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  Erectile dysfunction: At the last visit, we decreased the spironolactone dose and prescribed him sildenafil. Since then, he has not had any "action," so he has not filled sildenafil. I told to give me a call if he wants [place] to change around his medications any further if he has problems.  Hypertension: He's been checking his blood pressure at home, and it is ranged in the 130s to 140s. He hasn't had any problems taking his medications, and would like some refills today  He is a nonsmoker, and I have reviewed his medications with him today.   Review of Systems  Constitutional: Negative for fever, chills and weight loss.  Respiratory: Negative for cough and shortness of breath.   Cardiovascular: Negative for chest pain, palpitations, orthopnea and leg swelling.  Psychiatric/Behavioral: Negative for depression. The patient is not nervous/anxious.     Objective:  Physical Exam: Filed Vitals:   04/24/15 1339  BP: 132/65  Pulse: 64  Temp: 97.8 F (36.6 C)  TempSrc: Oral  Weight: 218 lb 14.4 oz (99.292 kg)  SpO2: 100%   Physical exam: General: very nice hispanic man resting in bed comfortably, appropriately conversational HEENT: no scleral icterus, extra-ocular muscles intact, oropharynx without lesions Cardiac: regular rate and rhythm, no rubs, murmurs or gallops Pulm: breathing well, clear to auscultation bilaterally Abd: bowel sounds normal, soft, nondistended, non-tender Ext: warm and well perfused, without pedal edema Lymph: no cervical or supraclavicular lymphadenopathy  Assessment & Plan:  Case  discussed with Dr. Oswaldo Done  Chronic systolic heart failure About a month ago, he saw Dr. Jones Broom, who performed a bedside echo and reported as ejection fraction to have improved from 30-50%. He'll get a formal echo in April. He's been compliant with all of his medications, denies any weight gain, lower extremity edema, orthopnea.  Erectile dysfunction At the last visit, we dropped his dose of spironolactone. Since then, he hasn't had a sexual encounter, so he can't say whether it's helped or not. He got divorced back in November, and had a girlfriend who is no longer seen. I told him to give [place] a call back if he has problems with erections, and we can change his medications, specifically dropping his carvedilol dose if need be.  HTN (hypertension) His blood pressure continues to be well controlled today. I've refilled some of his medications.  Obesity He would like to start losing weight, and initiating for 10 pounds per month. He is very interested in talking to our dietitian, so I placed a referral for him to see Ms. Lupita Leash Plyler.    Medications Ordered Meds ordered this encounter  Medications  . amLODipine (NORVASC) 5 MG tablet    Sig: Take 1 tablet (5 mg total) by mouth daily.    Dispense:  90 tablet    Refill:  3  . carvedilol (COREG) 25 MG tablet    Sig: Take 1 tablet (25 mg total) by mouth 2 (two) times daily with a meal.    Dispense:  180 tablet    Refill:  3  . enalapril (VASOTEC) 20 MG tablet    Sig: Take 1 tablet (20 mg total) by mouth 2 (two)  times daily.    Dispense:  180 tablet    Refill:  3  . furosemide (LASIX) 40 MG tablet    Sig: Take 1 tablet (40 mg total) by mouth daily.    Dispense:  30 tablet    Refill:  4  . spironolactone (ALDACTONE) 25 MG tablet    Sig: Take 0.5 tablets (12.5 mg total) by mouth daily.    Dispense:  90 tablet    Refill:  3   Other Orders Orders Placed This Encounter  Procedures  . Amb ref to Medical Nutrition Therapy-MNT     Referral Priority:  Routine    Referral Type:  Consultation    Referral Reason:  Specialty Services Required    Requested Specialty:  Nutrition    Number of Visits Requested:  1   Follow Up: Return in about 6 months (around 10/22/2015) for general follow up.

## 2015-04-24 NOTE — Assessment & Plan Note (Signed)
At the last visit, we dropped his dose of spironolactone. Since then, he hasn't had a sexual encounter, so he can't say whether it's helped or not. He got divorced back in November, and had a girlfriend who is no longer seen. I told him to give [place] a call back if he has problems with erections, and we can change his medications, specifically dropping his carvedilol dose if need be.

## 2015-04-24 NOTE — Assessment & Plan Note (Signed)
About a month ago, he saw Dr. Jones Broom, who performed a bedside echo and reported as ejection fraction to have improved from 30-50%. He'll get a formal echo in April. He's been compliant with all of his medications, denies any weight gain, lower extremity edema, orthopnea.

## 2015-04-24 NOTE — Assessment & Plan Note (Signed)
He would like to start losing weight, and initiating for 10 pounds per month. He is very interested in talking to our dietitian, so I placed a referral for him to see Ms. Lupita Leash Plyler.

## 2015-04-26 NOTE — Progress Notes (Signed)
Internal Medicine Clinic Attending  Case discussed with Dr. Flores at the time of the visit.  We reviewed the resident's history and exam and pertinent patient test results.  I agree with the assessment, diagnosis, and plan of care documented in the resident's note. 

## 2015-05-17 ENCOUNTER — Ambulatory Visit (INDEPENDENT_AMBULATORY_CARE_PROVIDER_SITE_OTHER): Payer: BLUE CROSS/BLUE SHIELD | Admitting: Dietician

## 2015-05-17 VITALS — Ht 68.0 in | Wt 221.3 lb

## 2015-05-17 DIAGNOSIS — Z713 Dietary counseling and surveillance: Secondary | ICD-10-CM | POA: Diagnosis not present

## 2015-05-17 DIAGNOSIS — Z6833 Body mass index (BMI) 33.0-33.9, adult: Secondary | ICD-10-CM | POA: Diagnosis not present

## 2015-05-17 DIAGNOSIS — E669 Obesity, unspecified: Secondary | ICD-10-CM

## 2015-05-17 DIAGNOSIS — R7303 Prediabetes: Secondary | ICD-10-CM

## 2015-05-17 NOTE — Progress Notes (Signed)
  Medical Nutrition Therapy:  Appt start time: 1530 end time:  1630. Visit # 1  Assessment:  Primary concerns today: weight loss.  [name] lives with his 52 year old sone and works nights. He does most of the cooking and shopping for them and says his son wants to decrease his weight, so is willing to do this diet with him.He has cut down on regular soda and decreased portions sizes which resulted in small weight loss but is now at a plateau  Preferred Learning Style:  No preference indicated  Learning Readiness: Ready and Change in progress  ANTHROPOMETRICS: weight-221.3#, height-68", BMI- 33.6 WEIGHT HISTORY: has been obese for past 3 years SLEEP:not discussed today MEDICATIONS: reveiwed BLOOD SUGAR:not applicable DIETARY INTAKE: Usual eating pattern includes 2-3 meals and 2-3 snacks per day. Everyday foods include baked, fried foods, mostly chicken but some beef, pork and fish.  Avoided foods include vegetables.   24-hr recall:  B ( AM): sweet bread and coffee Snk ( AM): fruit L ( PM): rice, beans, tortillas. fish and chicken Snk ( PM): fruit D ( PM): meat, often fried, rice, tortillas, lettuce, tomatoes Beverages: water, regular soda, juice  Usual physical activity: walks 60- minutes 3 times a week  Estimated energy needs: 2500 calories to maintain,  1500 for 2 # per week weight loss 95 g protein 42-65 for maintenance  Fat grams  Progress Towards Goal(s):  In progress.   Nutritional Diagnosis:  NB-1.1 Food and nutrition-related knowledge deficit As related to lack of knowledge about energy intake/calroies and fat.  As evidenced by his report and lack of progres with weight loss despite making changes in his diet. .    Intervention:  Nutrition education about fat and calkorie counting. Coordination of care: none needed at this time  Teaching Method Utilized:Visual, Auditory and hands on  Handouts given during visit include: 1 week food and activity record and  how to eat healthy a weight loss in spanish Barriers to learning/adherence to lifestyle change: he says he has time at night to work on calories and fat grams and that this will be a lot of work Demonstrated degree of understanding via:  Teach Back   Monitoring/Evaluation:  Dietary intake, exercise, food record, and body weight in 2 week(s).

## 2015-05-17 NOTE — Patient Instructions (Addendum)
Un peso ms saludable para usted es 170-187 libras o 77-85 kilogramos.  Usted est haciendo bien en comer ms sano!

## 2015-05-31 ENCOUNTER — Ambulatory Visit: Payer: Self-pay | Admitting: Dietician

## 2015-06-13 ENCOUNTER — Ambulatory Visit (INDEPENDENT_AMBULATORY_CARE_PROVIDER_SITE_OTHER): Payer: BLUE CROSS/BLUE SHIELD | Admitting: Dietician

## 2015-06-13 VITALS — Ht 68.0 in | Wt 215.3 lb

## 2015-06-13 DIAGNOSIS — E669 Obesity, unspecified: Secondary | ICD-10-CM | POA: Diagnosis not present

## 2015-06-13 DIAGNOSIS — Z6832 Body mass index (BMI) 32.0-32.9, adult: Secondary | ICD-10-CM | POA: Diagnosis not present

## 2015-06-13 DIAGNOSIS — Z713 Dietary counseling and surveillance: Secondary | ICD-10-CM | POA: Diagnosis not present

## 2015-06-13 NOTE — Patient Instructions (Addendum)
Tiene divertido con MyFitnessPAL!!!    Recuento de caloras para bajar de peso (Calorie Counting for [employer] Loss)  Las caloras son energa que se obtiene de lo que se come y se bebe. El organismo [place] esta energa para mantenerlo Scientist, water quality. La cantidad de caloras que come tiene incidencia Gap Inc. Cuando come ms caloras de las que el cuerpo necesita, este acumula las caloras extra [place]. Cuando come Nation[employer] Mutual Insurance de las que el cuerpo [place], este quema grasa para obtener la energa que requiere. El recuento de caloras es el registro de la cantidad de caloras que come y Investment banker, operational. Si se asegura de comer menos caloras de las que el cuerpo necesita, debe bajar de [place]. Para que el recuento de caloras funcione, tendr que comer la cantidad de caloras adecuadas para usted en un da, para bajar una cantidad de peso saludable por semana. Una cantidad de peso saludable para bajar por semana suele ser [place] 1 y Enedina Finner (0,5 a 0,9kg). Un nutricionista puede determinar la cantidad de caloras que necesita por da y sugerirle cmo alcanzar su objetivo calrico.  CUL ES MI PLAN? Mi objetivo es comer __________ Garry Heater da.  Si como esta cantidad de caloras por da, debo bajar unas __________ Albertine Grates. QU DEBO SABER ACERCA DEL RECUENTO DE CALORAS? A fin de alcanzar su objetivo diario de caloras, tendr que:  Averiguar cuntas caloras hay en cada alimento que le Lobbyist. Intente hacerlo antes de comer.  Decida la cantidad que puede comer del alimento.  Anote lo que comi y cuntas caloras tena. Esta tarea se conoce como llevar un registro de comidas. DNDE ENCUENTRO INFORMACIN SOBRE LAS CALORAS? Es posible Veterinary surgeon cantidad de caloras que contiene un alimento en la etiqueta de informacin nutricional. Tenga en cuenta que toda la informacin que se incluye en una etiqueta se basa en una porcin especfica del alimento. Si un alimento  no tiene una etiqueta de informacin nutricional, intente buscar las caloras en Internet o pida ayuda al nutricionista. CMO DECIDO CUNTO COMER? Para decidir qu cantidad del alimento puede comer, tendr que tener en cuenta el nmero de caloras en una porcin y el tamao de una porcin. Es posible Scientist, water quality en la etiqueta de informacin nutricional. Si un alimento no tiene una etiqueta de informacin nutricional, busque los Terminous en Internet o pida ayuda al nutricionista. Recuerde que las caloras se calculan por porcin. Si opta por comer ms de una porcin de un alimento, tendr [employer] Inc las caloras por porcin por la cantidad de porciones que planea comer. Por ejemplo, la etiqueta de un envase de pan puede decir que el tamao de una porcin es [place], y que una porcin tiene 90caloras. Si come 1rodaja, habr comido 90caloras. Si come 2rodajas, habr comido 180caloras. CMO LLEVO UN REGISTRO DE COMIDAS? Despus de cada comida, registre la siguiente informacin en el registro de comidas:  Lo que comi.  La cantidad que comi.  La cantidad de caloras que tena.  Luego, sume las caloras. Tenga a Scientific laboratory technician de comidas, por ejemplo, en un anotador de bolsillo. Otra alternativa es usar una aplicacin en el telfono mvil o un sitio web. Algunos programas calcularn las caloras y [employer] Corporation la cantidad de caloras que le [place] vez que agregue un alimento al Engineer, maintenance (IT). CULES SON ALGUNOS CONSEJOS PARA EL RECUENTO DE CALORAS?  Use las caloras de los alimentos y las bebidas que lo sacien y no  lo dejen con apetito, por ejemplo, frutos secos y [address] de frutos secos, verduras, protenas magras y alimentos con alto contenido de fibra (ms de 5g de fibra por porcin).  Coma alimentos nutritivos y evite las caloras vacas. Las caloras vacas son aquellas que se obtienen de los alimentos o las bebidas que no contienen muchos nutrientes, como los  dulces y los refrescos. Es mejor comer una comida nutritiva altamente calrica (como un aguacate) que una con pocos nutrientes (como una bolsa de patatas fritas).  Sepa cuntas caloras tienen los alimentos que come con ms frecuencia. [employer] Global, no tiene que buscar este dato cada vez que los come.  Est atento a los International aid/development worker hipocalricos, pero que en realidad contienen muchas caloras, como los productos de [place], los refrescos y los dulces sin Holiday representative.  Preste atencin a las Limited Brands, Lubrizol Corporation refrescos, las bebidas a base de [place], las bebidas con alcohol y los jugos, que contienen muchas caloras, pero no le dan saciedad. Opte por las bebidas bajas en caloras, como el agua y las [address] dietticas.  Concntrese en tratar de contar las caloras de los alimentos que tienen la mayor cantidad de caloras. Registrar las caloras de una ensalada que solo contiene hortalizas es menos importante que calcular las de un batido de [place].  Encuentre un mtodo para controlar las caloras que funcione para usted. Sea creativo. La [employer]ley[employer]Davidson de las personas que alcanzan el xito encuentran mtodos para llevar un registro de cunto comen en un da, incluso si no cuentan cada calora. CULES SON ALGUNOS CONSEJOS PARA CONTROLAR LAS PORCIONES?  Sepa cuntas caloras hay en una porcin. Esto lo ayudar a saber cuntas porciones de un alimento determinado puede comer.  Use una taza medidora para medir los [place] [employer], lo que es muy til al principio. Con el tiempo, podr hacer un clculo estimativo de los tamaos de las porciones de algunos alimentos.  Dedique tiempo a poner porciones de diferentes alimentos en sus platos, tazones y tazas predilectos, a fin de saber cmo se ve una porcin.  Intente no comer directamente de [place] bolsa o una caja, ya que esto puede llevarlo a comer en exceso. Ponga la cantidad [employer] gustara comer en una taza o un plato, a fin de  asegurarse de que est comiendo la porcin correcta.  Use platos, vasos y tazones ms pequeos para no comer en exceso. Esta es una forma rpida y sencilla de poner en prctica el control de las porciones. Si el plato es ms pequeo, le caben menos alimentos.  Intente no hacer muchas tareas mientras come, como ver televisin o usar la computadora. Si es la hora de comer, sintese a Museum/gallery conservator y disfrute de Chemical engineer. Esto lo ayudar a que empiece a Public house manager cundo est satisfecho. Tambin le permitir estar ms consciente de lo que come y cunto est comiendo. CMO PUEDO HACER EL RECUENTO DE CALORAS CUANDO COMO AFUERA?  Pida porciones ms pequeas o porciones para nios.  Considere la posibilidad de Agricultural consultant un plato principal y las guarniciones, en lugar de pedir su propio plato principal.  Si pide su propio plato principal, coma solo la mitad. Pida una caja al comienzo de la comida y ponga all el resto del plato principal, para no sentir la tentacin de comerlo.  Busque las caloras en el men. Si se detallan las caloras, elija las opciones que contengan la menor cantidad.  Elija platos que incluyan verduras, frutas, cereales  integrales, productos lcteos con bajo contenido de [place] y Associate Professor. Centrarse en elegir con inteligencia alimentos de cada uno de los 5grupos que puede ayudarlo a seguir por el buen camino en los restaurantes.  Opte por los alimentos hervidos, asados, cocidos a la parrilla o al vapor.  Elija el agua, la [place] t helado sin azcar u otras bebidas que no contengan azcares agregados. Si desea una bebida alcohlica, escoja una opcin con menos caloras. Por ejemplo, un margarita normal puede [employer], y un vaso de vino tiene unas 150.  No coma alimentos que contengan mantequilla, estn empanados, fritos o que se sirvan con salsa a base de crema. Generalmente, los alimentos que se etiquetan como "crujientes" estn fritos, a menos que se  indique lo contrario.  Ordene los [employer] Stores, las salsas y los jarabes aparte, ya que suelen tener muchas caloras; por lo tanto, no los consuma en grandes cantidades.  Tenga cuidado con las [place]. Muchas personas piensan que las ensaladas son Neomia Dear opcin saludable, pero en muchas cosas, esto no es as. Hay muchas ensaladas que contienen tocino, pollo frito, grandes cantidades de [place], patatas fritas y Print production planner. Todos estos productos son altamente calricos. Si desea [place], elija una de hortalizas y pida carnes a la parrilla o un filete. Ordene el aderezo aparte o pida aceite de [place] y vinagre o limn para Emergency planning/management officer.  Haga un clculo estimativo de la cantidad de porciones que le sirven. Por ejemplo, una porcin de arroz cocido equivale a media taza o tiene el tamao aproximado de un molde de [place], o de media pelota de tenis. Conocer el tamao de las porciones lo ayudar a Theme park manager atento a la cantidad de comida que come Pitney Bowes. La lista que sigue le [place] el tamao de algunas porciones comunes a partir de objetos cotidianos.  1onza (28g) = 4dados apilados.  3onzas (85g) = de cartas.  1cucharadita = 1dado.  1cucharada = media pelota de tenis de mesa.  2cucharadas = 1pelota de tenis de mesa.  Media taza = 1pelota de tenis o de magdalena.  1taza = 1 pelota de bisbol.

## 2015-06-13 NOTE — Progress Notes (Signed)
  Medical Nutrition Therapy:  Appt start time: 1520 end time:  1610. Visit # 2  Assessment:  Primary concerns today: weight loss.  [name] has been keeping record ~ 4 days a week of his food intake and counting calories. He likes his new meal plan because he is saving money from not buying soda or red meat.  He used the cookbook loaned and copied some new recipes.  He weighs himself daily and has welked three times a week for at least 30 minutes.    ANTHROPOMETRICS: weight-215.3#, weight loss of 6# in the past month , BMI- 32.7 SLEEP:not discussed today DIETARY INTAKE: Everyday foods include baked, fried foods, mostly chicken but some beef, pork and fish.  Avoided foods include vegetables.   24-hr recall:  B ( AM): sweet bread and coffee Snk ( AM): fruit L ( PM): rice, beans, tortillas. fish and chicken, vegetables, soup Snk ( PM): fruit D ( PM): sandwich on whole wheat bread, lettuce, tomatoes Beverages: water, juice some   Estimated energy needs: 2500 calories to maintain,  1500 for 2 # per week weight loss,~ 2000 for 1 # per week 95 g protein 42-65 for maintenance  Fat grams  Progress Towards Goal(s):  In progress.   Nutritional Diagnosis:  NB-1.1 Food and nutrition-related knowledge deficit As related to lack of knowledge about energy intake/calories and fat is improving  As evidenced by his report and  progres with weight loss .    Intervention:  Nutrition education about My Fitness pal, importance of self monitoring and  Cutting fat and calories to achieve his goal of 200# Coordination of care:  Teaching Method Utilized:Visual, Auditory and hands on  Handouts given during visit include: 1 week food and activity record and how to eat healthy a weight loss in spanish Barriers to learning/adherence to lifestyle change: he says he is still determined to be ~ 200 #.  Demonstrated degree of understanding via:  Teach Back   Monitoring/Evaluation:  Dietary intake,  exercise, food record, and body weight in 4 week(s).

## 2015-06-14 ENCOUNTER — Encounter: Payer: Self-pay | Admitting: Internal Medicine

## 2015-06-15 ENCOUNTER — Encounter: Payer: Self-pay | Admitting: Internal Medicine

## 2015-06-15 ENCOUNTER — Ambulatory Visit (INDEPENDENT_AMBULATORY_CARE_PROVIDER_SITE_OTHER): Payer: BLUE CROSS/BLUE SHIELD | Admitting: Internal Medicine

## 2015-06-15 ENCOUNTER — Ambulatory Visit (HOSPITAL_COMMUNITY)
Admission: RE | Admit: 2015-06-15 | Discharge: 2015-06-15 | Disposition: A | Discharge status: Home / Self Care | Payer: BLUE CROSS/BLUE SHIELD | Source: Ambulatory Visit | Attending: Internal Medicine | Admitting: Internal Medicine

## 2015-06-15 VITALS — BP 110/63 | HR 64 | Temp 98.1°F | Ht 68.5 in | Wt 216.0 lb

## 2015-06-15 DIAGNOSIS — M7732 Calcaneal spur, left foot: Secondary | ICD-10-CM

## 2015-06-15 DIAGNOSIS — M722 Plantar fascial fibromatosis: Secondary | ICD-10-CM | POA: Insufficient documentation

## 2015-06-15 MED ORDER — MELOXICAM 7.5 MG PO TABS: 7.5000 mg | ORAL_TABLET | Freq: Every day | ORAL | Status: DC

## 2015-06-15 NOTE — Patient Instructions (Signed)
1. Please make a follow up appointment for 6 weeks.   2. Please take all medications as previously prescribed with the following changes:  Take Mobic 7.5 mg daily as needed for the pain.   3. If you have worsening of your symptoms or new symptoms arise, please call the clinic ([phone]), or go to the ER immediately if symptoms are severe.  You have done a great job in taking all your medications. Please continue to do this.  Plantar Fasciitis Plantar fasciitis is a painful foot condition that affects the heel. It occurs when the band of tissue that connects the toes to the heel bone (plantar fascia) becomes irritated. This can happen after exercising too much or doing other repetitive activities (overuse injury). The pain from plantar fasciitis can range from mild irritation to severe pain that makes it difficult for you to walk or move. The pain is usually worse in the morning or after you have been sitting or lying down for a while. CAUSES This condition may be caused by:  Standing for long periods of time.  Wearing shoes that do not fit.  Doing high-impact activities, including running, aerobics, and ballet.  Being overweight.  Having an abnormal way of walking (gait).  Having tight calf muscles.  Having high arches in your feet.  Starting a new athletic activity. SYMPTOMS The main symptom of this condition is heel pain. Other symptoms include:  Pain that gets worse after activity or exercise.  Pain that is worse in the morning or after resting.  Pain that goes away after you walk for a few minutes. DIAGNOSIS This condition may be diagnosed based on your signs and symptoms. Your health care provider will also do a physical exam to check for:  A tender area on the bottom of your foot.  A high arch in your foot.  Pain when you move your foot.  Difficulty moving your foot. You may also need to have imaging studies to confirm the diagnosis. These can  include:  X-rays.  Ultrasound.  MRI. TREATMENT  Treatment for plantar fasciitis depends on the severity of the condition. Your treatment may include:  Rest, ice, and over-the-counter pain medicines to manage your pain.  Exercises to stretch your calves and your plantar fascia.  A splint that holds your foot in a stretched, upward position while you sleep (night splint).  Physical therapy to relieve symptoms and prevent problems in the future.  Cortisone injections to relieve severe pain.  Extracorporeal shock wave therapy (ESWT) to stimulate damaged plantar fascia with electrical impulses. It is often used as a last resort before surgery.  Surgery, if other treatments have not worked after 12 months. HOME CARE INSTRUCTIONS  Take medicines only as directed by your health care provider.  Avoid activities that cause pain.  Roll the bottom of your foot over a bag of ice or a bottle of cold water. Do this for 20 minutes, 3-4 times a day.  Perform simple stretches as directed by your health care provider.  Try wearing athletic shoes with air-sole or gel-sole cushions or soft shoe inserts.  Wear a night splint while sleeping, if directed by your health care provider.  Keep all follow-up appointments with your health care provider. PREVENTION   Do not perform exercises or activities that cause heel pain.  Consider finding low-impact activities if you continue to have problems.  Lose weight if you need to. The best way to prevent plantar fasciitis is to avoid the activities that  aggravate your plantar fascia. SEEK MEDICAL CARE IF:  Your symptoms do not go away after treatment with home care measures.  Your pain gets worse.  Your pain affects your ability to move or do your daily activities.   This information is not intended to replace advice given to you by your health care provider. Make sure you discuss any questions you have with your health care provider.    Document Released: 12/04/2000 Document Revised: 11/30/2014 Document Reviewed: 01/19/2014 Elsevier Interactive Patient Education 2016 Elsevier Inc.   Fascitis plantar (Plantar Fasciitis) La fascitis plantar es una afeccin dolorosa que se produce en el taln. Ocurre cuando la banda de tejido que [employer] dedos con el hueso del taln (fascia plantar) se irrita. Esto puede ocurrir despus de Academic librarian ejercicio u otras actividades repetitivas (lesin por uso excesivo). El dolor de la fascitis plantar puede ser de leve (irritacin) a intenso, y en los casos ms agudos puede dificultar que la persona camine o se [place]. Por lo general, el dolor es peor a la maana o despus de Personal assistant sentado o acostado durante un perodo. CAUSAS Este trastorno puede ser causado por:  Estar de pie durante largos perodos.  Usar zapatos que no calcen bien.  Practicar actividades de alto impacto, como correr, o hacer ejercicios [employer] o ballet.  Tener sobrepeso.  Tener una forma de caminar (marcha) anormal.  Tener los msculos de la pantorrilla tensos.  Tener el arco alto en los pies.  Comenzar una nueva actividad fsica. SNTOMAS El sntoma principal de esta afeccin es el dolor en el taln. Otros sntomas pueden ser los siguientes:  Dolor que empeora despus de una actividad o un ejercicio.  Dolor ms intenso a la maana o despus de Lawyer.  Dolor que desaparece despus de caminar durante unos minutos. DIAGNSTICO Esta afeccin se puede diagnosticar en funcin de los signos y los sntomas. El mdico Location manager un examen fsico para controlar si tiene lo siguiente:  Un rea dolorida en la parte inferior del pie.  El arco alto.  Dolor al Doctor, general practice.  Dificultad para mover el pie. Tambin puede necesitar estudios por imgenes para confirmar el diagnstico. Estos pueden incluir los siguientes:  Radiografas.  Ecografa.  Resonancia magntica. TRATAMIENTO  El  tratamiento de la fascitis plantar depende de la gravedad de la afeccin. El tratamiento puede incluir lo siguiente:  Reposo, hielo y analgsicos de venta libre para Human resources officer.  Ejercicios para estirar las pantorrillas y la fascia plantar.  Una frula que [employer] estirado y [place] mientras usted duerme (frula nocturna).  Fisioterapia para East[employer]ak sntomas y Physiological scientist en el futuro.  Inyecciones de cortisona para Engineer, materials intenso.  Tratamiento con ondas de choque extracorpreas para estimular con impulsos elctricos la fascia plantar lesionada. Esto suele usarse como un ltimo recurso antes de la [place].  [place], si los otros tratamientos no han funcionado despus de . INSTRUCCIONES PARA EL CUIDADO EN EL HOGAR  Tome los medicamentos solamente como se lo haya indicado el mdico.  Evite las actividades que causan dolor.  Frote la parte inferior del pie sobre una bolsa de hielo o una botella de agua fra. Haga esto durante , de 3a 4veces al [employer].  Realice estiramientos simples como se lo haya indicado el mdico.  Trate de usar calzado deportivo con amortiguacin de aire o gel, o plantillas blandas.  Si el mdico se lo ha indicado, use una frula nocturna para dormir.  Cumpla con todas las visitas de control, segn le indique su mdico. PREVENCIN   No realice ejercicios ni actividades que le causen dolor en el taln.  Considere la posibilidad de Corporate investment banker actividades de bajo impacto si sigue teniendo problemas.  Pierda peso si lo necesita. La mejor forma de prevenir la fascitis plantar es evitar las actividades que lesionan ms la fascia plantar. SOLICITE ATENCIN MDICA SI:  Los sntomas no desaparecen despus del tratamiento en su casa.  El dolor [place].  El Artist la capacidad de moverse o de Education officer, environmental las actividades diarias.   Esta informacin no tiene Theme park manager el consejo del mdico. Asegrese  de hacerle al mdico cualquier pregunta que tenga.   Document Released: 12/19/2004 Document Revised: 07/26/2014 Elsevier Interactive Patient Education [employer]

## 2015-06-15 NOTE — Progress Notes (Signed)
Subjective:   Patient ID: Phillip [name] male   DOB: January 31, 1966 50 y.o.   MRN: [id number]  HPI: [name] is a 50 y.o. spanish speaking male w/ PMHx of HTN, chronic sCHF, GERD, presents to the clinic today for an acute visit for left heel pain. Patient says this has been an issue for a few months but has become acutely worse over the past few weeks. Patient works as a Geographical information systems officer at [employer] and is on his feet all the time. States the pain is a dull throbbing pain located in the anterior/medial portion of his heel and states it is the most painful if he is off of his feet for a period of time and then resumes ambulation/activity. Says it hurts the most first thing in the morning. No history of trauma or previous history of fracture. Says the other foot does not cause him discomfort. Has not tried anything in particular to alleviate the pain. Has not tried stretches or exercise.   Past Medical History  Diagnosis Date  . CHF (congestive heart failure) (HCC)   . Hypertension   . Chronic back pain     "neck down" (07/20/2013)  . GERD (gastroesophageal reflux disease)   . High cholesterol   . Cardiac insufficiency (HCC)   . Renal calculus   . Chronic systolic heart failure (HCC) 06/15/2014   Current Outpatient Prescriptions  Medication Sig Dispense Refill  . amLODipine (NORVASC) 5 MG tablet Take 1 tablet (5 mg total) by mouth daily. 90 tablet 3  . atorvastatin (LIPITOR) 40 MG tablet Take 1 tablet (40 mg total) by mouth daily. 30 tablet 11  . carvedilol (COREG) 25 MG tablet Take 1 tablet (25 mg total) by mouth 2 (two) times daily with a meal. 180 tablet 3  . enalapril (VASOTEC) 20 MG tablet Take 1 tablet (20 mg total) by mouth 2 (two) times daily. 180 tablet 3  . furosemide (LASIX) 40 MG tablet Take 1 tablet (40 mg total) by mouth daily. 30 tablet 4  . prednisoLONE acetate (PRED FORTE) 1 % ophthalmic suspension One drop, right eye  3x/day    . spironolactone  (ALDACTONE) 25 MG tablet Take 0.5 tablets (12.5 mg total) by mouth daily. 90 tablet 3   No current facility-administered medications for this visit.    Review of Systems: General: Denies fever, chills, diaphoresis, appetite change and fatigue.  Respiratory: Denies SOB, DOE, cough, and wheezing.   Cardiovascular: Denies chest pain and palpitations.  Gastrointestinal: Denies nausea, vomiting, abdominal pain, and diarrhea.  Genitourinary: Denies dysuria, increased frequency, and flank pain. Endocrine: Denies hot or cold intolerance, polyuria, and polydipsia. Musculoskeletal: Positive for left heel pain. Denies myalgias, back pain, joint swelling, and gait problem.  Skin: Denies pallor, rash and wounds.  Neurological: Denies dizziness, seizures, syncope, weakness, lightheadedness, numbness and headaches.  Psychiatric/Behavioral: Denies mood changes, and sleep disturbances.  Objective:   Physical Exam: Filed Vitals:   06/15/15 1326  BP: 110/63  Pulse: 64  Temp: 98.1 F (36.7 C)  TempSrc: Oral  Height: 5' 8.5" (1.74 m)  Weight: 216 lb (97.977 kg)  SpO2: 100%    General: Alert, cooperative, NAD. HEENT: PERRL, EOMI. Moist mucus membranes Neck: Full range of motion without pain, supple, no lymphadenopathy or carotid bruits Lungs: Clear to ascultation bilaterally, normal work of respiration, no wheezes, rales, rhonchi Heart: RRR, no murmurs, gallops, or rubs Abdomen: Soft, non-tender, non-distended, BS + Extremities: No cyanosis, clubbing, or edema. Left heel point  tenderness over medial/distal portion. No obvious swelling, erythema, or edema. Mild pain elicited with dorsiflexion of the toes.  Neurologic: Alert & oriented X3, cranial nerves II-XII intact, strength grossly intact, sensation intact to light touch   Assessment & Plan:   Please see problem based assessment and plan.

## 2015-06-16 NOTE — Assessment & Plan Note (Signed)
Patient with very classic description and findings suggestive of plantar fasciitis. On his feet most of the day, pain on the medial/distal portion of his heel, worse after period of rest when attempting to resume activity. Worst first thing in the AM. Has not tried anything for the pain. No h/o trauma. No issues with the other foot. Has been present for a few months, worsened overt the past 3 weeks. On exam, tenderness over the medial/distal portion of the heel. Mild pain elicited with dorsiflexion of the toes. No erythema, swelling, or increased warmth. XR shows no evidence of fracture, but does show inferior calcaneal spur, which could very well be associated with his plantar fasciitis.  -Counseled on various exercises, rest, etc. Given information in spanish about condition. Advised frozen bottle of water rolled on underside of foot 3-4 times daily.  -Given Rx for Mobic 7.5 mg daily prn for pain -If no improvement with conservative management, would refer to sports medicine for orthotics.

## 2015-06-16 NOTE — Progress Notes (Signed)
Internal Medicine Clinic Attending  Case discussed with Dr. Jones at the time of the visit.  We reviewed the resident's history and exam and pertinent patient test results.  I agree with the assessment, diagnosis, and plan of care documented in the resident's note.  

## 2015-06-19 ENCOUNTER — Encounter: Payer: Self-pay | Admitting: Internal Medicine

## 2015-06-20 ENCOUNTER — Telehealth: Payer: Self-pay | Admitting: *Deleted

## 2015-06-20 NOTE — Telephone Encounter (Addendum)
Fax from [facility] - Pt states he's taking Furosemide 40 mg 2 tabs a day, if so they need a new script. Pt's med list has 1 tab daily. Please clarify . Thanks.

## 2015-06-21 NOTE — Telephone Encounter (Signed)
He has never been Rx'd 40 mg 2 pills a day. I reviewed HF last note and they didn't change the dose either. Saw Dr Earnest Conroy in Jan 2017 - will need to wait on his response.

## 2015-06-22 ENCOUNTER — Encounter: Payer: Self-pay | Admitting: Internal Medicine

## 2015-06-22 ENCOUNTER — Other Ambulatory Visit: Payer: Self-pay | Admitting: Internal Medicine

## 2015-06-22 DIAGNOSIS — M722 Plantar fascial fibromatosis: Secondary | ICD-10-CM

## 2015-06-23 ENCOUNTER — Telehealth: Payer: Self-pay

## 2015-06-23 NOTE — Telephone Encounter (Signed)
Interpreter ID [phone] used to contact pt regarding x-ray results and referral to sports medicine per Dr. Yetta Barre  Pt needing update to letter to work No option for restrictions Needs new note to reflect that he was seen 3/23, unable work under restriction at work so must not work those 2 weeks due to foot If can't work, not paid, and then cannot pay his rent and other bills.    Can you do this?  If you cannot he requests appointment to speak in person.

## 2015-06-24 ENCOUNTER — Other Ambulatory Visit: Payer: Self-pay | Admitting: Internal Medicine

## 2015-06-24 ENCOUNTER — Encounter: Payer: Self-pay | Admitting: Internal Medicine

## 2015-06-24 DIAGNOSIS — N521 Erectile dysfunction due to diseases classified elsewhere: Secondary | ICD-10-CM

## 2015-06-24 NOTE — Telephone Encounter (Signed)
He appeared euvolemic our last visit. Please ask him to just take furosemide 40mg  daily; if his weight goes up 3 pounds or his ankles start to swell, he can take 80mg  daily until his weight gets back to his baseline.

## 2015-06-24 NOTE — Telephone Encounter (Signed)
I printed the letter and I'll drop it under Helen's door. Thanks Leigh.

## 2015-06-26 ENCOUNTER — Telehealth: Payer: Self-pay

## 2015-06-26 NOTE — Telephone Encounter (Signed)
Let's stick with furosemide 80mg  daily until he gets back to baseline weight. If he doesn't get back to his baseline after one week, please have him come in for an urgent appoint. I expect he'll be okay. Thanks Leigh.

## 2015-06-26 NOTE — Telephone Encounter (Signed)
Sent mychart message to pt informing letter is ready

## 2015-06-26 NOTE — Telephone Encounter (Signed)
Patient has gained 4 lbs and is endorsing swelling in abdomen.  Is the instruction to increase to 80mg  until back down to baseline weight OK or should we advise him of additional instructions?  He ran out of lasix 10 days ago and had trouble getting it at the pharmacy.  I have called [name] and they have ready for patient.

## 2015-06-26 NOTE — Telephone Encounter (Signed)
Pt called - no answer; left message via [facility] interpreter Angelique Blonder # [phone] " take furosemide 40mg  daily; if his weight goes up 3 pounds or his ankles start to swell, he can take 80mg  daily until his weight gets back to his baseline" per Dr Earnest Conroy.

## 2015-07-03 ENCOUNTER — Encounter: Payer: Self-pay | Admitting: Family Medicine

## 2015-07-03 ENCOUNTER — Ambulatory Visit (INDEPENDENT_AMBULATORY_CARE_PROVIDER_SITE_OTHER): Payer: BLUE CROSS/BLUE SHIELD | Admitting: Family Medicine

## 2015-07-03 VITALS — BP 113/65 | HR 72 | Ht 68.0 in | Wt 215.0 lb

## 2015-07-03 DIAGNOSIS — M722 Plantar fascial fibromatosis: Secondary | ICD-10-CM

## 2015-07-03 NOTE — Progress Notes (Signed)
Patient ID: Phillip [name], male   DOB: 24-Aug-1965, 50 y.o.   MRN: [id number]   Interpreter for visit is Maretta Los

## 2015-07-03 NOTE — Progress Notes (Signed)
Patient ID: Phillip [name], male   DOB: April 01, 1965, 50 y.o.   MRN: [id number]  [name] - 50 y.o. male MRN [id number]  Date of birth: [birth year]    SUBJECTIVE:     Chief Complaint: Left foot pain  HPI: One month of increasing left foot pain. Worse if he walks on it. Worse if he's been sitting or sleeping and then takes a step, that first step is very painful. Pain is in the heel radiates up to the middle side of his foot. Pain is sharp. Relieved by rest. She's not having any pain in his right foot. Pain is 4 out of 10. ROS:     No unusual weight change recently, fever, sweats, chills. He's noted no rash or skin lesions on his left foot.  PERTINENT  PMH / PSH FH / / SH:  Past Medical, Surgical, Social, and Family History Reviewed & Updated in the EMR.  Pertinent findings include:  Prediabetes, obesity, hypertension, history of CHF.  OBJECTIVE: BP 113/65 mmHg  Pulse 72  Ht 5\' 8"  (1.727 m)  Wt 215 lb (97.523 kg)  BMI 32.70 kg/m2  Physical Exam:  Vital signs are reviewed. GEN.: Well-developed overweight male no acute distress FEET: Bilaterally congenital pes planus. Left foot migrates more medially than the left on standing. Midfoot collapse area transverse arch is also poorly maintained. Tender to palpation over the origin the plantar fascia and at the medial nerve bundle posterior to the needle malleolus. Negative Tinel here. SKIN: No erythema, no unusual warmth, no lesions noted on the left foot or ankle VASCULAR: Dorsalis pedis pulses 2+ bilaterally equal as are posterior tibialis pulses.  ASSESSMENT & PLAN:  See problem based charting & AVS for pt instructions.

## 2015-07-03 NOTE — Assessment & Plan Note (Addendum)
Left, mild Discussed etiology and mechanical issues. Place him in green insoles with scaphoid pads bilaterally. Continue stretching with iced soda bottle 2-3 times a day. Follow-up 2-4 weeks. At that time we'll consider corticosteroid injection. He's not particularly excited about that at this point. I told him if he had significant improvement with the mechanical changes we would not need to do that. He has good shoes that are not overly warmth. I think with the addition of some scaphoid pad and arch support he'll do quite well.

## 2015-07-11 ENCOUNTER — Ambulatory Visit: Payer: Self-pay | Admitting: Dietician

## 2015-07-13 ENCOUNTER — Encounter: Payer: Self-pay | Admitting: Internal Medicine

## 2015-07-13 ENCOUNTER — Emergency Department (HOSPITAL_COMMUNITY)
Admission: EM | Admit: 2015-07-13 | Discharge: 2015-07-14 | Disposition: A | Discharge status: Home / Self Care | Payer: BLUE CROSS/BLUE SHIELD | Attending: Emergency Medicine | Admitting: Emergency Medicine

## 2015-07-13 ENCOUNTER — Emergency Department (HOSPITAL_COMMUNITY): Payer: BLUE CROSS/BLUE SHIELD

## 2015-07-13 ENCOUNTER — Encounter (HOSPITAL_COMMUNITY): Payer: Self-pay | Admitting: *Deleted

## 2015-07-13 DIAGNOSIS — R110 Nausea: Secondary | ICD-10-CM | POA: Insufficient documentation

## 2015-07-13 DIAGNOSIS — Z8719 Personal history of other diseases of the digestive system: Secondary | ICD-10-CM | POA: Diagnosis not present

## 2015-07-13 DIAGNOSIS — E7800 Pure hypercholesterolemia, unspecified: Secondary | ICD-10-CM | POA: Diagnosis not present

## 2015-07-13 DIAGNOSIS — R05 Cough: Secondary | ICD-10-CM | POA: Insufficient documentation

## 2015-07-13 DIAGNOSIS — Z9889 Other specified postprocedural states: Secondary | ICD-10-CM | POA: Insufficient documentation

## 2015-07-13 DIAGNOSIS — Z87442 Personal history of urinary calculi: Secondary | ICD-10-CM | POA: Diagnosis not present

## 2015-07-13 DIAGNOSIS — G8929 Other chronic pain: Secondary | ICD-10-CM | POA: Insufficient documentation

## 2015-07-13 DIAGNOSIS — I5022 Chronic systolic (congestive) heart failure: Secondary | ICD-10-CM | POA: Diagnosis not present

## 2015-07-13 DIAGNOSIS — R079 Chest pain, unspecified: Secondary | ICD-10-CM | POA: Insufficient documentation

## 2015-07-13 DIAGNOSIS — I10 Essential (primary) hypertension: Secondary | ICD-10-CM | POA: Insufficient documentation

## 2015-07-13 DIAGNOSIS — Z79899 Other long term (current) drug therapy: Secondary | ICD-10-CM | POA: Diagnosis not present

## 2015-07-13 MED ORDER — RANITIDINE HCL 150 MG/10ML PO SYRP
300.0000 mg | ORAL_SOLUTION | Freq: Once | ORAL | Status: AC
  Administered 2015-07-13: 300 mg via ORAL
  Filled 2015-07-13: qty 20

## 2015-07-13 MED ORDER — SUCRALFATE 1 GM/10ML PO SUSP
1.0000 g | Freq: Once | ORAL | Status: AC
  Administered 2015-07-13: 1 g via ORAL
  Filled 2015-07-13: qty 10

## 2015-07-13 NOTE — ED Provider Notes (Signed)
CSN: [id number]     Arrival date & time 07/13/15  2308 History  By signing my name below, I, Bethel Born, attest that this documentation has been prepared under the direction and in the presence of Tomasita Crumble, MD. Electronically Signed: Bethel Born, ED Scribe. 07/14/2015. 1:23 AM   Chief Complaint  Patient presents with  . Chest Pain    The history is provided by the patient. A language interpreter was used ([place]).   Phillip [name] is a 50 y.o. male with history of CHF, HTN, high cholesterol, and GERD who presents to the Emergency Department complaining of 8/10 in severity, central chest pain with onset just over an hour ago at work.  The pt works in a warehouse and at the onset of pain he was performing a pulling motion. Aspirin and 2 doses of NTG given by EMS provided minimal relief. Deep breathing exacerbates the pain.  Associated symptoms include variably productive cough and  nausea. Pt denies vomiting and recent fever. No history of MI or  DVT/PE. No recent surgery or long travel.   Past Medical History  Diagnosis Date  . CHF (congestive heart failure) (HCC)   . Hypertension   . Chronic back pain     "neck down" (07/20/2013)  . GERD (gastroesophageal reflux disease)   . High cholesterol   . Cardiac insufficiency (HCC)   . Renal calculus   . Chronic systolic heart failure (HCC) 06/15/2014   Past Surgical History  Procedure Laterality Date  . Eye surgery Right   . Corneal transplant Right ~ 2010  . Cataract extraction w/ intraocular lens implant Right 02/16/2014  . Cardiac catheterization  2014?  [name] Left heart catheterization with coronary angiogram N/A 05/26/2014    Procedure: LEFT HEART CATHETERIZATION WITH CORONARY ANGIOGRAM;  Surgeon: Laurey Morale, MD;  Location: [facility] CATH LAB;  Service: Cardiovascular;  Laterality: N/A;   No family history on file. Social History  Substance Use Topics  . Smoking status: Never Smoker   . Smokeless tobacco: Never Used  .  Alcohol Use: No    Review of Systems  10 Systems reviewed and all are negative for acute change except as noted in the HPI.  Allergies  Review of patient's allergies indicates no known allergies.  Home Medications   Prior to Admission medications   Medication Sig Start Date End Date Taking? Authorizing Provider  amLODipine (NORVASC) 5 MG tablet Take 1 tablet (5 mg total) by mouth daily. 04/24/15   Selina Cooley, MD  atorvastatin (LIPITOR) 40 MG tablet Take 1 tablet (40 mg total) by mouth daily. 03/02/15 03/01/16  Selina Cooley, MD  carvedilol (COREG) 25 MG tablet Take 1 tablet (25 mg total) by mouth 2 (two) times daily with a meal. 04/24/15   Selina Cooley, MD  enalapril (VASOTEC) 20 MG tablet Take 1 tablet (20 mg total) by mouth 2 (two) times daily. 04/24/15   Selina Cooley, MD  furosemide (LASIX) 40 MG tablet Take 1 tablet (40 mg total) by mouth daily. 04/24/15   Selina Cooley, MD  meloxicam (MOBIC) 7.5 MG tablet Take 1 tablet (7.5 mg total) by mouth daily. 06/15/15 06/14/16  Courtney Paris, MD  prednisoLONE acetate (PRED FORTE) 1 % ophthalmic suspension One drop, right eye  3x/day 08/11/14   Historical Provider, MD  spironolactone (ALDACTONE) 25 MG tablet Take 0.5 tablets (12.5 mg total) by mouth daily. 04/24/15   Selina Cooley, MD   BP 124/62 mmHg  Pulse 66  Temp(Src) 98.3 F (36.8  C) (Oral)  Resp 14  Wt 215 lb (97.523 kg)  SpO2 99% Physical Exam  Constitutional: He is oriented to person, place, and time. Vital signs are normal. He appears well-developed and well-nourished.  Non-toxic appearance. He does not appear ill. He appears distressed.  HENT:  Head: Normocephalic and atraumatic.  Nose: Nose normal.  Mouth/Throat: Oropharynx is clear and moist. No oropharyngeal exudate.  Eyes: Conjunctivae and EOM are normal. Pupils are equal, round, and reactive to light. No scleral icterus.  Neck: Normal range of motion. Neck supple. No tracheal deviation, no edema, no erythema and normal range of motion  present. No thyroid mass and no thyromegaly present.  Cardiovascular: Normal rate, regular rhythm, S1 normal, S2 normal, normal heart sounds, intact distal pulses and normal pulses.  Exam reveals no gallop and no friction rub.   No murmur heard. Pulmonary/Chest: Effort normal and breath sounds normal. No respiratory distress. He has no wheezes. He has no rhonchi. He has no rales.  Abdominal: Soft. Normal appearance and bowel sounds are normal. He exhibits no distension, no ascites and no mass. There is no hepatosplenomegaly. There is no tenderness. There is no rebound, no guarding and no CVA tenderness.  Musculoskeletal: Normal range of motion. He exhibits no edema or tenderness.  Lymphadenopathy:    He has no cervical adenopathy.  Neurological: He is alert and oriented to person, place, and time. He has normal strength. No cranial nerve deficit or sensory deficit.  Skin: Skin is warm, dry and intact. No petechiae and no rash noted. He is not diaphoretic. No erythema. No pallor.  Psychiatric: He has a normal mood and affect. His behavior is normal. Judgment normal.  Nursing note and vitals reviewed.   ED Course  Procedures (including critical care time) DIAGNOSTIC STUDIES: Oxygen Saturation is 99% on RA,  normal by my interpretation.    COORDINATION OF CARE: 11:15 PM Discussed treatment plan which includes lab work, CXR, EKG with pt at bedside and pt agreed to plan.  1:20 AM I re-evaluated the patient and provided an update on the results of his lab work and imaging. He feels significantly better. Plan for repeat troponin.     Labs Review Labs Reviewed  CBC WITH DIFFERENTIAL/PLATELET - Abnormal; Notable for the following:    Hemoglobin 12.3 (*)    HCT 37.9 (*)    All other components within normal limits  BASIC METABOLIC PANEL - Abnormal; Notable for the following:    BUN 28 (*)    Creatinine, Ser 1.25 (*)    All other components within normal limits  BRAIN NATRIURETIC PEPTIDE   I-STAT TROPOININ, ED  I-STAT TROPOININ, ED    Imaging Review Dg Chest 2 View  07/13/2015  CLINICAL DATA:  Mid to right chest pain radiating to the back. Increased with breathing. Hypertension. Nonsmoker. EXAM: CHEST  2 VIEW COMPARISON:  08/24/2014 FINDINGS: Normal heart size and pulmonary vascularity. No focal airspace disease or consolidation in the lungs. No blunting of costophrenic angles. No pneumothorax. Mediastinal contours appear intact. Degenerative changes in the spine. IMPRESSION: No active cardiopulmonary disease. Electronically Signed   By: Burman Nieves M.D.   On: 07/13/2015 23:41   I have personally reviewed and evaluated these images and lab results as part of my medical decision-making.   EKG Interpretation   Date/Time:  Friday July 14 2015 02:31:42 EDT Ventricular Rate:  56 PR Interval:  177 QRS Duration: 95 QT Interval:  443 QTC Calculation: 427 R Axis:   30 Text  Interpretation:  Sinus rhythm Baseline wander in lead(s) V1 No  significant change since last tracing Confirmed by Erroll Luna  [phone]) on 07/14/2015 3:00:20 AM      MDM   Final diagnoses:  None     Patient presents to the ED for chest pain.  He is PERC negative.  I do have concern for ACS given his PMH and current HPI.  He was given aspirin and nitro per EMS.  He was given zantac and sucralfate in the ED since he has a history of GERD as well.  EKG here does not show ischemia.  Will obtain blood work and CXR.   3:04 AM upon repeat evaluation, patient states his pain has resolved. Repeat troponin and EKG were performed 3 hours later and were negative as well. Heart score remains 3 for age and risk factors. He is low-risk ACS. Advised to see primary care within 3 days to complete his cardiac workup. He appears well in no acute distress, vital signs were within his normal limits and he is safe for discharge.  I personally performed the services described in this documentation, which was  scribed in my presence. The recorded information has been reviewed and is accurate.      Tomasita Crumble, MD 07/14/15 [phone]

## 2015-07-13 NOTE — ED Notes (Signed)
Pt arrives from work via ems. Per report, the pt had onset of cp tonight, worse with breathing in. Pt was given 4 asa and 1 nitro en route to the ED.

## 2015-07-13 NOTE — ED Notes (Signed)
Patient transported to X-ray 

## 2015-07-14 ENCOUNTER — Encounter: Payer: Self-pay | Admitting: Internal Medicine

## 2015-07-14 LAB — BASIC METABOLIC PANEL
ANION GAP: 9 (ref 5–15)
BUN: 28 mg/dL — ABNORMAL HIGH (ref 6–20)
CALCIUM: 9.7 mg/dL (ref 8.9–10.3)
CHLORIDE: 107 mmol/L (ref 101–111)
CO2: 28 mmol/L (ref 22–32)
CREATININE: 1.25 mg/dL — AB (ref 0.61–1.24)
GFR calc non Af Amer: >60 mL/min (ref >60)
GLUCOSE: 95 mg/dL (ref 65–99)
Potassium: 4.3 mmol/L (ref 3.5–5.1)
Sodium: 144 mmol/L (ref 135–145)

## 2015-07-14 LAB — CBC WITH DIFFERENTIAL/PLATELET
BASOS PCT: 0 %
Basophils Absolute: 0 10*3/uL (ref 0.0–0.1)
Eosinophils Absolute: 0.4 10*3/uL (ref 0.0–0.7)
Eosinophils Relative: 4 %
HEMATOCRIT: 37.9 % — AB (ref 39.0–52.0)
HEMOGLOBIN: 12.3 g/dL — AB (ref 13.0–17.0)
LYMPHS ABS: 2.9 10*3/uL (ref 0.7–4.0)
LYMPHS PCT: 32 %
MCH: 28.4 pg (ref 26.0–34.0)
MCHC: 32.5 g/dL (ref 30.0–36.0)
MCV: 87.5 fL (ref 78.0–100.0)
MONO ABS: 0.7 10*3/uL (ref 0.1–1.0)
MONOS PCT: 8 %
NEUTROS ABS: 5 10*3/uL (ref 1.7–7.7)
NEUTROS PCT: 56 %
Platelets: 362 10*3/uL (ref 150–400)
RBC: 4.33 MIL/uL (ref 4.22–5.81)
RDW: 14.3 % (ref 11.5–15.5)
WBC: 9.1 10*3/uL (ref 4.0–10.5)

## 2015-07-14 LAB — I-STAT TROPONIN, ED
TROPONIN I, POC: 0 ng/mL (ref 0.00–0.08)
Troponin i, poc: 0 ng/mL (ref 0.00–0.08)

## 2015-07-14 LAB — BRAIN NATRIURETIC PEPTIDE: B Natriuretic Peptide: 10.5 pg/mL (ref 0.0–100.0)

## 2015-07-14 NOTE — Discharge Instructions (Signed)
Nonspecific Chest Pain [name], your blood work, EKG, and chest xray did not show any signs of a heart attack.   Take zantac as needed for your heartburn.  See a primary care doctor within 3 days for complete the work up for your heart.  If any symptoms worsen, come back to the ED immediately. Thank you.   [name], su anlisis de [place], EKG y radiografa de trax no mostraron ningn signo de un ataque al corazn. Tome zantac segn sea necesario para su acidez estomacal. Consulte a un mdico de atencin primaria dentro de 3 das para completar el trabajo para su corazn. Si los sntomas empeoran, vuelva a la DE inmediatamente. Gracias.  It is often hard to find the cause of chest pain. There is always a chance that your pain could be related to something serious, such as a heart attack or a blood clot in your lungs. Chest pain can also be caused by conditions that are not life-threatening. If you have chest pain, it is very important to follow up with your doctor.  HOME CARE  If you were prescribed an antibiotic medicine, finish it all even if you start to feel better.  Avoid any activities that cause chest pain.  Do not use any tobacco products, including cigarettes, chewing tobacco, or electronic cigarettes. If you need help quitting, ask your doctor.  Do not drink alcohol.  Take medicines only as told by your doctor.  Keep all follow-up visits as told by your doctor. This is important. This includes any further testing if your chest pain does not go away.  Your doctor may tell you to keep your head raised (elevated) while you sleep.  Make lifestyle changes as told by your doctor. These may include:  Getting regular exercise. Ask your doctor to suggest some activities that are safe for you.  Eating a heart-healthy diet. Your doctor or a diet specialist (dietitian) can help you to learn healthy eating options.  Maintaining a healthy weight.  Managing diabetes,  if necessary.  Reducing stress. GET HELP IF:  Your chest pain does not go away, even after treatment.  You have a rash with blisters on your chest.  You have a fever. GET HELP RIGHT AWAY IF:  Your chest pain is worse.  You have an increasing cough, or you cough up blood.  You have severe belly (abdominal) pain.  You feel extremely weak.  You pass out (faint).  You have chills.  You have sudden, unexplained chest discomfort.  You have sudden, unexplained discomfort in your arms, back, neck, or jaw.  You have shortness of breath at any time.  You suddenly start to sweat, or your skin gets clammy.  You feel nauseous.  You vomit.  You suddenly feel light-headed or dizzy.  Your heart begins to beat quickly, or it feels like it is skipping beats. These symptoms may be an emergency. Do not wait to see if the symptoms will go away. Get medical help right away. Call your local emergency services (911 in the [place] Do not drive yourself to the hospital.   This information is not intended to replace advice given to you by your health care provider. Make sure you discuss any questions you have with your health care provider.   Document Released: 08/28/2007 Document Revised: 04/01/2014 Document Reviewed: 10/15/2013 Elsevier Interactive Patient Education [employer]

## 2015-07-21 ENCOUNTER — Ambulatory Visit: Payer: BLUE CROSS/BLUE SHIELD | Admitting: Family Medicine

## 2015-07-21 ENCOUNTER — Encounter (HOSPITAL_COMMUNITY): Payer: Self-pay | Admitting: Emergency Medicine

## 2015-07-21 ENCOUNTER — Emergency Department (HOSPITAL_COMMUNITY)
Admission: EM | Admit: 2015-07-21 | Discharge: 2015-07-22 | Disposition: A | Discharge status: Home / Self Care | Payer: BLUE CROSS/BLUE SHIELD | Attending: Emergency Medicine | Admitting: Emergency Medicine

## 2015-07-21 ENCOUNTER — Emergency Department (HOSPITAL_COMMUNITY): Payer: BLUE CROSS/BLUE SHIELD

## 2015-07-21 DIAGNOSIS — K2970 Gastritis, unspecified, without bleeding: Secondary | ICD-10-CM

## 2015-07-21 DIAGNOSIS — I5022 Chronic systolic (congestive) heart failure: Secondary | ICD-10-CM | POA: Diagnosis not present

## 2015-07-21 DIAGNOSIS — Z8639 Personal history of other endocrine, nutritional and metabolic disease: Secondary | ICD-10-CM | POA: Insufficient documentation

## 2015-07-21 DIAGNOSIS — J029 Acute pharyngitis, unspecified: Secondary | ICD-10-CM | POA: Diagnosis not present

## 2015-07-21 DIAGNOSIS — Z791 Long term (current) use of non-steroidal anti-inflammatories (NSAID): Secondary | ICD-10-CM | POA: Diagnosis not present

## 2015-07-21 DIAGNOSIS — Z9889 Other specified postprocedural states: Secondary | ICD-10-CM | POA: Diagnosis not present

## 2015-07-21 DIAGNOSIS — K219 Gastro-esophageal reflux disease without esophagitis: Secondary | ICD-10-CM | POA: Insufficient documentation

## 2015-07-21 DIAGNOSIS — Z79899 Other long term (current) drug therapy: Secondary | ICD-10-CM | POA: Insufficient documentation

## 2015-07-21 DIAGNOSIS — Z87442 Personal history of urinary calculi: Secondary | ICD-10-CM | POA: Diagnosis not present

## 2015-07-21 DIAGNOSIS — R1011 Right upper quadrant pain: Secondary | ICD-10-CM

## 2015-07-21 DIAGNOSIS — R1012 Left upper quadrant pain: Secondary | ICD-10-CM

## 2015-07-21 DIAGNOSIS — G8929 Other chronic pain: Secondary | ICD-10-CM | POA: Insufficient documentation

## 2015-07-21 DIAGNOSIS — R079 Chest pain, unspecified: Secondary | ICD-10-CM | POA: Diagnosis present

## 2015-07-21 LAB — CBC
HEMATOCRIT: 34.9 % — AB (ref 39.0–52.0)
Hemoglobin: 11.5 g/dL — ABNORMAL LOW (ref 13.0–17.0)
MCH: 28.9 pg (ref 26.0–34.0)
MCHC: 33 g/dL (ref 30.0–36.0)
MCV: 87.7 fL (ref 78.0–100.0)
Platelets: 439 10*3/uL — ABNORMAL HIGH (ref 150–400)
RBC: 3.98 MIL/uL — ABNORMAL LOW (ref 4.22–5.81)
RDW: 14.6 % (ref 11.5–15.5)
WBC: 9.2 10*3/uL (ref 4.0–10.5)

## 2015-07-21 LAB — BASIC METABOLIC PANEL
Anion gap: 10 (ref 5–15)
BUN: 28 mg/dL — AB (ref 6–20)
CHLORIDE: 105 mmol/L (ref 101–111)
CO2: 23 mmol/L (ref 22–32)
Calcium: 9 mg/dL (ref 8.9–10.3)
Creatinine, Ser: 1.3 mg/dL — ABNORMAL HIGH (ref 0.61–1.24)
GFR calc Af Amer: >60 mL/min (ref >60)
GLUCOSE: 89 mg/dL (ref 65–99)
POTASSIUM: 4.5 mmol/L (ref 3.5–5.1)
Sodium: 138 mmol/L (ref 135–145)

## 2015-07-21 LAB — I-STAT TROPONIN, ED: Troponin i, poc: 0 ng/mL (ref 0.00–0.08)

## 2015-07-21 MED ORDER — GI COCKTAIL ~~LOC~~
30.0000 mL | Freq: Once | ORAL | Status: AC
  Administered 2015-07-21: 30 mL via ORAL
  Filled 2015-07-21: qty 30

## 2015-07-21 NOTE — ED Notes (Signed)
C/o pressure across chest with sob, nausea, and "a little" vomiting since 6pm.

## 2015-07-21 NOTE — ED Notes (Signed)
Interrupter used for Assessment.

## 2015-07-21 NOTE — ED Provider Notes (Signed)
CSN: [id number]     Arrival date & time 07/21/15  1936 History   First MD Initiated Contact with Patient 07/21/15 2023     Chief Complaint  Patient presents with  . Chest Pain     (Consider location/radiation/quality/duration/timing/severity/associated sxs/prior Treatment) HPI 50 y.o. male with a hx of CHF, HTN, CKD, and GERD and was recently evaluated in the ED for evaluation of lower b/l chest and upper abdominal pain on 4/20. During this evaluation he was found to have a reassuring EKG with no signs of ischemia, and negative delta troponin. His sx were significantly improved by sucralfate and zantac suggestive of GI etiology of his sx. He had no improvement previously with nitro and ASA by EMS. He was recommended to f/u with his PCP with whom he has an appointment on 5/1. He returns to the tonight noting persistent, but significantly improved sx. He states that his pain is now about a 3/10, better from previous 8/10. He describes it as constant, sharp, worse with palpation, eating and deep breathing. He describes his pain as across the top of his abdomen and in his lower b/l chest. He has had associated nausea and small volume emesis with it. Denies diarrhea, fever, chills. He denies any SOB, diaphoresis, chest pressure. He states that he was not rx'd anything for his sx, but has been taking his normal BP medication regimen.  His history of taken with the help of an interpreter.   Past Medical History  Diagnosis Date  . CHF (congestive heart failure) (HCC)   . Hypertension   . Chronic back pain     "neck down" (07/20/2013)  . GERD (gastroesophageal reflux disease)   . High cholesterol   . Cardiac insufficiency (HCC)   . Renal calculus   . Chronic systolic heart failure (HCC) 06/15/2014   Past Surgical History  Procedure Laterality Date  . Eye surgery Right   . Corneal transplant Right ~ 2010  . Cataract extraction w/ intraocular lens implant Right 02/16/2014  . Cardiac catheterization   2014?  [name] Left heart catheterization with coronary angiogram N/A 05/26/2014    Procedure: LEFT HEART CATHETERIZATION WITH CORONARY ANGIOGRAM;  Surgeon: Laurey Morale, MD;  Location: [facility] CATH LAB;  Service: Cardiovascular;  Laterality: N/A;   No family history on file. Social History  Substance Use Topics  . Smoking status: Never Smoker   . Smokeless tobacco: Never Used  . Alcohol Use: No    Review of Systems  Constitutional: Positive for activity change and appetite change. Negative for fever, chills and diaphoresis.  HENT: Positive for sore throat (with bitter taste in mouth). Negative for congestion, rhinorrhea, sinus pressure, sneezing and trouble swallowing.   Respiratory: Negative for cough, chest tightness and shortness of breath.   Cardiovascular: Positive for chest pain. Negative for palpitations and leg swelling.  Gastrointestinal: Positive for nausea, vomiting and abdominal pain. Negative for diarrhea, constipation and blood in stool.  Genitourinary: Negative for dysuria and difficulty urinating.  Musculoskeletal: Negative for back pain and neck pain.  Skin: Negative for rash.  Neurological: Negative for dizziness, syncope, light-headedness and headaches.  All other systems reviewed and are negative.     Allergies  Review of patient's allergies indicates no known allergies.  Home Medications   Prior to Admission medications   Medication Sig Start Date End Date Taking? Authorizing Provider  amLODipine (NORVASC) 5 MG tablet Take 1 tablet (5 mg total) by mouth daily. 04/24/15  Yes Selina Cooley, MD  enalapril (VASOTEC)  20 MG tablet Take 1 tablet (20 mg total) by mouth 2 (two) times daily. 04/24/15  Yes Selina Cooley, MD  furosemide (LASIX) 40 MG tablet Take 1 tablet (40 mg total) by mouth daily. 04/24/15  Yes Selina Cooley, MD  meloxicam (MOBIC) 7.5 MG tablet Take 1 tablet (7.5 mg total) by mouth daily. 06/15/15 06/14/16 Yes Courtney Paris, MD  carvedilol (COREG) 25 MG tablet Take 1  tablet (25 mg total) by mouth 2 (two) times daily with a meal. Patient not taking: Reported on 07/21/2015 04/24/15   Selina Cooley, MD  omeprazole (PRILOSEC) 20 MG capsule Take 1 capsule (20 mg total) by mouth daily. 07/22/15   Francoise Ceo, DO   BP 100/66 mmHg  Pulse 51  Temp(Src) 97.2 F (36.2 C) (Oral)  Resp 17  SpO2 100% Physical Exam  Constitutional: He is oriented to person, place, and time. He appears well-developed and well-nourished. No distress.  HENT:  Head: Normocephalic and atraumatic.  Nose: Nose normal.  Mouth/Throat: Oropharynx is clear and moist.  Eyes: Conjunctivae and EOM are normal.  Right eye patched from prior eye surgery  Neck: Neck supple.  Cardiovascular: Normal rate, regular rhythm, normal heart sounds and intact distal pulses.   Pulmonary/Chest: Effort normal and breath sounds normal. He exhibits tenderness. He exhibits no laceration and no crepitus.    Abdominal: Soft. There is tenderness in the right upper quadrant, epigastric area and left upper quadrant. There is no rigidity, no rebound, no guarding, no CVA tenderness, no tenderness at McBurney's point and negative Murphy's sign.    Musculoskeletal: He exhibits no edema or tenderness.  Neurological: He is alert and oriented to person, place, and time. No cranial nerve deficit. Coordination normal.  Skin: Skin is warm and dry. No rash noted. He is not diaphoretic.  Nursing note and vitals reviewed.   ED Course  Procedures (including critical care time) Labs Review Labs Reviewed  BASIC METABOLIC PANEL - Abnormal; Notable for the following:    BUN 28 (*)    Creatinine, Ser 1.30 (*)    All other components within normal limits  CBC - Abnormal; Notable for the following:    RBC 3.98 (*)    Hemoglobin 11.5 (*)    HCT 34.9 (*)    Platelets 439 (*)    All other components within normal limits  HEPATIC FUNCTION PANEL - Abnormal; Notable for the following:    Bilirubin, Direct <0.1 (*)    All other  components within normal limits  LIPASE, BLOOD  I-STAT TROPOININ, ED    Imaging Review Dg Chest 2 View  07/21/2015  CLINICAL DATA:  50 year old male with history of chest pain. EXAM: CHEST  2 VIEW COMPARISON:  Chest x-ray 07/13/2015. FINDINGS: Mild diffuse peribronchial cuffing. Lung volumes are normal. No consolidative airspace disease. No pleural effusions. No pneumothorax. No pulmonary nodule or mass noted. Pulmonary vasculature and the cardiomediastinal silhouette are within normal limits. Atherosclerosis in the thoracic aorta. IMPRESSION: 1. Mild diffuse peribronchial cuffing, suggestive of acute bronchitis. 2. Atherosclerosis. Electronically Signed   By: Trudie Reed M.D.   On: 07/21/2015 21:07   I have personally reviewed and evaluated these images and lab results as part of my medical decision-making.   EKG Interpretation   Date/Time:  Friday July 21 2015 19:45:15 EDT Ventricular Rate:  73 PR Interval:  178 QRS Duration: 88 QT Interval:  404 QTC Calculation: 445 R Axis:   16 Text Interpretation:  Normal sinus rhythm Normal ECG No significant  change  was found Confirmed by CAMPOS  MD, Caryn Bee ([id number]) on 07/21/2015 9:58:08 PM      MDM  50 y.o. male with recent ED evaluation of upper abdominal and lower chest pain with negative cardiac evaluation and response GI medications returns to the ED noting improved but persistent sx. He has not been taking anything for his sx since his ED visit. Physical exam, as above with tenderness predominantly in the epigastrium but also in the RUQ and LUQ but no peritoneal signs. AF with VSS sitting in NAD. Labs resulted reassuringly with neg troponin, Cr. Similar to his previous measures, normal lipase and LFTs. CXR shows findings suggestive of mild bronchitis but denies any sx of cough. EKG shows NSR with normal intervals, similar to prior. He was given a gi cocktail and again experienced significant improvement in his sx. He was rx'd a PPI and was  recommended to follow up with his PCP on Monday, as scheduled. Given low risk for ACS with negative recent delta trop and sx suggestive of gastritis do not feel that a delta troponin is necessary at this time. He was then discharged home in good condition to follow up as an outpatient.   Final diagnoses:  Gastritis  LUQ abdominal pain  RUQ abdominal pain  Gastroesophageal reflux disease, esophagitis presence not specified      Francoise Ceo, DO 07/23/15 1308  Azalia Bilis, MD 07/24/15 [phone]

## 2015-07-22 LAB — HEPATIC FUNCTION PANEL
ALT: 41 U/L (ref 17–63)
AST: 27 U/L (ref 15–41)
Albumin: 4.1 g/dL (ref 3.5–5.0)
Alkaline Phosphatase: 47 U/L (ref 38–126)
Bilirubin, Direct: <0.1 mg/dL — ABNORMAL LOW (ref 0.1–0.5)
TOTAL PROTEIN: 8 g/dL (ref 6.5–8.1)
Total Bilirubin: 0.4 mg/dL (ref 0.3–1.2)

## 2015-07-22 LAB — LIPASE, BLOOD: LIPASE: 30 U/L (ref 11–51)

## 2015-07-22 MED ORDER — OMEPRAZOLE 20 MG PO CPDR: 20.0000 mg | DELAYED_RELEASE_CAPSULE | Freq: Every day | ORAL | Status: DC

## 2015-07-22 NOTE — ED Notes (Addendum)
Discharge instructions reviewed with interrupter.

## 2015-07-22 NOTE — Discharge Instructions (Signed)
Dolor abdominal en adultos  (Abdominal Pain, Adult)  El dolor de estómago (abdominal) puede tener muchas causas. La mayoría de las veces, el dolor de estómago no es peligroso. Muchos de estos casos de dolor de estómago pueden controlarse y tratarse en casa.  CUIDADOS EN EL HOGAR   · No tome medicamentos que lo ayuden a defecar (laxantes), salvo que su médico se lo indique.  · Solo tome los medicamentos que le haya indicado su médico.  · Coma o beba lo que le indique su médico. Su médico le dirá si debe seguir una dieta especial.  SOLICITE AYUDA SI:  · No sabe cuál es la causa del dolor de estómago.  · Tiene dolor de estómago cuando siente ganas de vomitar (náuseas) o tiene colitis (diarrea).  · Tiene dolor durante la micción o la evacuación.  · El dolor de estómago lo despierta de noche.  · Tiene dolor de estómago que empeora o mejora cuando come.  · Tiene dolor de estómago que empeora cuando come alimentos grasosos.  · Tiene fiebre.  SOLICITE AYUDA DE INMEDIATO SI:   · El dolor no desaparece en un plazo máximo de 2 horas.  · No deja de (vomitar).  · El dolor cambia y se localiza solo en la parte derecha o izquierda del estómago.  · La materia fecal es sanguinolenta o de aspecto alquitranado.  ASEGÚRESE DE QUE:   · Comprende estas instrucciones.  · Controlará su afección.  · Recibirá ayuda de inmediato si no mejora o si empeora.     Esta información no tiene como fin reemplazar el consejo del médico. Asegúrese de hacerle al médico cualquier pregunta que tenga.     Document Released: 06/07/2008 Document Revised: 04/01/2014  Elsevier Interactive Patient Education ©2016 Elsevier Inc.

## 2015-07-24 ENCOUNTER — Ambulatory Visit (INDEPENDENT_AMBULATORY_CARE_PROVIDER_SITE_OTHER): Payer: BLUE CROSS/BLUE SHIELD | Admitting: Internal Medicine

## 2015-07-24 ENCOUNTER — Encounter: Payer: Self-pay | Admitting: Internal Medicine

## 2015-07-24 VITALS — BP 101/65 | HR 62 | Temp 98.0°F | Ht 68.0 in | Wt 209.4 lb

## 2015-07-24 DIAGNOSIS — I10 Essential (primary) hypertension: Secondary | ICD-10-CM

## 2015-07-24 DIAGNOSIS — K922 Gastrointestinal hemorrhage, unspecified: Secondary | ICD-10-CM | POA: Insufficient documentation

## 2015-07-24 DIAGNOSIS — I5022 Chronic systolic (congestive) heart failure: Secondary | ICD-10-CM

## 2015-07-24 MED ORDER — FUROSEMIDE 40 MG PO TABS: ORAL_TABLET | ORAL | Status: DC

## 2015-07-24 MED ORDER — CARVEDILOL 25 MG PO TABS: 12.5000 mg | ORAL_TABLET | Freq: Two times a day (BID) | ORAL | Status: DC

## 2015-07-24 NOTE — Assessment & Plan Note (Signed)
About a month ago, he was prescribed meloxicam for plantar fasciitis. Two weeks following that, he began having epigastric pain and melena. He was seen in the emergency department twice, where acute coronary syndrome was ruled out. During those visits, his hemoglobin had dropped from 15-11.5. He was prescribed omeprazole that has helped his epigastric abdominal pain but he continues to have melena. Today, he was slightly hypertensive at 110/60, with a pulse of 62, but was not orthostatic.  He clinically has an upper gastrointestinal bleed. This is most likely NSAID induced gastritis, but could be Helicobacter pylori as he is from [place]. I referred him to [facility] for an endoscopy. I've asked him to stop taking the meloxicam, amlodipine and furosemide, and drop his carvedilol dose in half from 25-12.5 mg twice daily. He'll continue taking omperazole; if he has persistent epigastric pain or melena, I've asked him to call me, and I will prescribe him pantoprazole 40 mg twice daily.

## 2015-07-24 NOTE — Progress Notes (Signed)
Patient ID: Phillip [name], male   DOB: 1965-08-30, 50 y.o.   MRN: [id number] [facility] Subjective:   Patient ID: Phillip [name] male   DOB: 13-Dec-1965 50 y.o.   MRN: [id number]  HPI: [name] is a 50 y.o. 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   Epigastric abdominal pain and melena: He was seen in the emergency department twice in the last month for epigastric pain; acute coronary syndrome was ruled out with normal EKG and flat troponins. He was discharged on omeprazole  and his symptoms have improved. During this time, he has also noticed melena. A CBC in the emergency department showed a drop in his hemoglobin from 15 to 11. He's been feeling dizzy upon standing and has been taking all of his antihypertensives as prescribed.  I have reviewed his medications with him today, and he is not smoking.  Review of Systems  Constitutional: Negative for fever, chills, weight loss and malaise/fatigue.  Respiratory: Negative for shortness of breath.   Cardiovascular: Negative for chest pain.  Gastrointestinal: Positive for heartburn, nausea, abdominal pain and melena. Negative for blood in stool.  Neurological: Positive for dizziness. Negative for loss of consciousness.    Objective:  Physical Exam: Filed Vitals:   07/24/15 0910  BP: 107/61  Pulse: 62  Temp: 98 F (36.7 C)  TempSrc: Oral  Height: 5\' 8"  (1.727 m)  Weight: 209 lb 6.4 oz (94.983 kg)  SpO2: 100%   General: friendly [place] man resting in chair comfortably, appropriately conversational Cardiac: regular rate and rhythm, no rubs, murmurs or gallops Pulm: breathing well, clear to auscultation bilaterally Abd: bowel sounds normal, soft, nondistended, non-tender Ext: warm and well perfused, without pedal edema Skin: no rash,  hair, or nail changes Neuro: alert and oriented X3, cranial nerves II-XII grossly intact, moving all extremities well  Assessment & Plan:  Case discussed with Dr. Heide Spark  Upper GI bleed About a month ago, he was prescribed meloxicam for plantar fasciitis. Two weeks following that, he began having epigastric pain and melena. He was seen in the emergency department twice, where acute coronary syndrome was ruled out. During those visits, his hemoglobin had dropped from 15-11.5. He was prescribed omeprazole that has helped his epigastric abdominal pain but he continues to have melena. Today, he was slightly hypertensive at 110/60, with a pulse of 62, but was not orthostatic.  He clinically has an upper gastrointestinal bleed. This is most likely NSAID induced gastritis, but could be Helicobacter pylori as he is from [place]. I referred him to [facility] for an endoscopy. I've asked him to stop taking the meloxicam, amlodipine and furosemide, and drop his carvedilol dose in half from 25-12.5 mg twice daily. He'll continue taking omperazole; if he has persistent epigastric pain or melena, I've asked him to call me, and I will prescribe him pantoprazole 40 mg twice daily.   Medications Ordered Meds ordered this encounter  Medications  . carvedilol (COREG) 25 MG tablet    Sig: Take 0.5 tablets (12.5 mg total) by mouth 2 (two) times daily with a meal.    Dispense:  180 tablet    Refill:  3  . furosemide (LASIX) 40 MG tablet    Sig: Si el peso aumenta en 5 libras, tome 1 pldora al da    Dispense:  30 tablet    Refill:  4  Other Orders Orders Placed This Encounter  Procedures  . Ambulatory referral to Gastroenterology    Referral Priority:  Routine    Referral Type:  Consultation    Referral Reason:  Specialty Services Required    Number of Visits Requested:  1   Follow Up: Return in about 2 weeks (around 08/07/2015).

## 2015-07-24 NOTE — Progress Notes (Signed)
Internal Medicine Clinic Attending  Case discussed with Dr. Flores at the time of the visit.  We reviewed the resident's history and exam and pertinent patient test results.  I agree with the assessment, diagnosis, and plan of care documented in the resident's note. 

## 2015-08-04 ENCOUNTER — Ambulatory Visit: Payer: BLUE CROSS/BLUE SHIELD | Admitting: Family Medicine

## 2015-09-02 ENCOUNTER — Encounter: Payer: Self-pay | Admitting: Internal Medicine

## 2015-09-12 ENCOUNTER — Ambulatory Visit (HOSPITAL_BASED_OUTPATIENT_CLINIC_OR_DEPARTMENT_OTHER)
Admission: RE | Admit: 2015-09-12 | Discharge: 2015-09-12 | Disposition: A | Discharge status: Home / Self Care | Payer: BLUE CROSS/BLUE SHIELD | Source: Ambulatory Visit | Attending: Internal Medicine | Admitting: Internal Medicine

## 2015-09-12 ENCOUNTER — Encounter: Payer: Self-pay | Admitting: *Deleted

## 2015-09-12 ENCOUNTER — Ambulatory Visit (HOSPITAL_COMMUNITY)
Admission: RE | Admit: 2015-09-12 | Discharge: 2015-09-12 | Disposition: A | Discharge status: Home / Self Care | Payer: BLUE CROSS/BLUE SHIELD | Source: Ambulatory Visit | Attending: Internal Medicine | Admitting: Internal Medicine

## 2015-09-12 ENCOUNTER — Other Ambulatory Visit (HOSPITAL_COMMUNITY): Payer: Self-pay | Admitting: *Deleted

## 2015-09-12 ENCOUNTER — Encounter (HOSPITAL_COMMUNITY): Payer: Self-pay

## 2015-09-12 ENCOUNTER — Encounter (HOSPITAL_COMMUNITY): Payer: Self-pay | Admitting: Internal Medicine

## 2015-09-12 VITALS — BP 164/90 | HR 73 | Wt 211.2 lb

## 2015-09-12 DIAGNOSIS — I10 Essential (primary) hypertension: Secondary | ICD-10-CM

## 2015-09-12 DIAGNOSIS — I5022 Chronic systolic (congestive) heart failure: Secondary | ICD-10-CM

## 2015-09-12 DIAGNOSIS — I110 Hypertensive heart disease with heart failure: Secondary | ICD-10-CM | POA: Insufficient documentation

## 2015-09-12 DIAGNOSIS — I340 Nonrheumatic mitral (valve) insufficiency: Secondary | ICD-10-CM | POA: Insufficient documentation

## 2015-09-12 DIAGNOSIS — E7800 Pure hypercholesterolemia, unspecified: Secondary | ICD-10-CM | POA: Insufficient documentation

## 2015-09-12 LAB — ECHOCARDIOGRAM COMPLETE
EERAT: 10.52
EWDT: 218 ms
FS: 28 % (ref 28–44)
IVS/LV PW RATIO, ED: 0.85
LA diam index: 1.92 cm/m2
LA vol A4C: 51.8 ml
LA vol index: 28.3 mL/m2
LASIZE: 40 mm
LAVOL: 58.8 mL
LEFT ATRIUM END SYS DIAM: 40 mm
LV E/e' medial: 10.52
LV E/e'average: 10.52
LV SIMPSON'S DISK: 50
LV TDI E'MEDIAL: 6.09
LV dias vol index: 61 mL/m2
LV dias vol: 126 mL (ref 62–150)
LV e' LATERAL: 9.36 cm/s
LV sys vol index: 30 mL/m2
LVOT area: 2.54 cm2
LVOT diameter: 18 mm
LVSYSVOL: 63 mL — AB (ref 21–61)
MV Dec: 218
MVPG: 4 mmHg
MVPKAVEL: 78.1 m/s
MVPKEVEL: 98.5 m/s
PW: 11.1 mm — AB (ref 0.6–1.1)
Stroke v: 63 ml
TDI e' lateral: 9.36

## 2015-09-12 MED ORDER — SPIRONOLACTONE 25 MG PO TABS: 12.5000 mg | ORAL_TABLET | Freq: Every day | ORAL | Status: DC

## 2015-09-12 MED ORDER — CARVEDILOL 25 MG PO TABS: 12.5000 mg | ORAL_TABLET | Freq: Two times a day (BID) | ORAL | Status: DC

## 2015-09-12 NOTE — Addendum Note (Signed)
Encounter addended by: Modesta Messing, CMA on: 09/12/2015 12:31 PM<BR>     Documentation filed: Dx Association, Patient Instructions Section, Orders

## 2015-09-12 NOTE — Progress Notes (Signed)
Patient ID: Phillip [name], male   DOB: 1965-06-30, 50 y.o.   MRN: [id number]  ADVANCED HF CLINIC NOTE  Patient ID: Phillip [name], male   DOB: 1965/05/07, 50 y.o.   MRN: [id number] PCP: Dr Burtis Junes  50 yo with history of HTN and cardiomyopathy presents for cardiology evaluation.  He has been hypertensive since age 21.  He is from [place] and moved here about 3 years ago.  He does not speak [place].  In 2014, he apparently had chest pain and had a stress PET at [facility].  Cardiac cath at that time did not show obstructive disease.  He had an echo here in 4/15 that showed normal EF, 55-60%.  In 2/16, he was admitted twice with chest pain.  Both times, symptoms were thought to be atypical for ischemia. During the first admission, there was one troponin of 0.19 while others were normal.  During the second admission, there was one troponin to 0.05 while the others were normal.  Echo was done in 2/16, showing EF down to 30-35% with inferior and inferoseptal wall motion abnormality.  He underwent coronary angiography on 05/26/14 with normal coronaries, LVEF 35-40%  and LVEDP 26. Lasix was increased.   After cMRi was ordered to look for possible [place] CM. But he could not complete due to severe claustrophobia even with valium.   He returns for HF follow up -Interpreter present.  Says he is doing very well. No dyspnea, orthopnea or PND. No swelling. Not taking spiro and carvedilol. Says at last visit with IM his BP was a little low so carvedilol cut in half. He ran out last week and didn't pick up yet. Still taking enalapril. No longer working in [employer]. He is looking for work. Stressful. Denies dyspnea or CP. BP high over las 4 days 150/100.   Echo today reviewed personally EF 50%   Labs (2/16): HIV negative, K 3.6, creatinine 1.09, hemoglobin 13.3 Labs (3/16/201): Creatinine 0.95 Labs (06/22/2014): K 3.8 Creatinine 1.07 Labs 07/13/2014; K 4.2 Creatinine 1.07   PMH: 1. HTN: Diagnosed at age 20. 2.  GERD 3. Hyperlipidemia 4. Nephrolithiasis 5. Cardiomyopathy: 2014 abnormal stress PET at [facility], had catheterization that Phillip report showed no significant coronary disease.  Echo (4/15) with EF 55-60%.  Echo (2/16) with EF 30-35%, akinesis of the basal to mid inferior and inferoseptal walls, apex poorly visualized, restrictive diastolic function, mild MR.  HIV negative in 2/16.   SH: Married, from [place], speaks only [place], works in [employer], nonsmoker, occasional ETOH.   FH: No h/o cardiomyopathy or CAD that he knows of.  Mother and sister with HTN.  ROS: All systems reviewed and negative except as Phillip HPI.   Current Outpatient Prescriptions  Medication Sig Dispense Refill  . enalapril (VASOTEC) 20 MG tablet Take 1 tablet (20 mg total) by mouth 2 (two) times daily. 180 tablet 3   No current facility-administered medications for this encounter.   BP 164/90 mmHg  Pulse 73  Wt 211 lb 4 oz (95.822 kg)  SpO2 100%' General: NAD Neck: No JVD, no thyromegaly or thyroid nodule.  Lungs: Clear to auscultation bilaterally with normal respiratory effort. CV: Nondisplaced PMI.  Heart regular S1/S2, no S3/S4, 2/6 SEM RSB  No edema.  No carotid bruit.  Normal pedal pulses.  Abdomen: obese. soft, nontender, no hepatosplenomegaly, no distention.  Skin: Intact without lesions or rashes.  Neurologic: Alert and oriented x 3.  Psych: Normal affect. Extremities: No clubbing or cyanosis.  HEENT: Normal.  Assessment/Plan: 1. Chronic systolic CHF: Echo (2/16) with EF 30-35%, Cath 05/26/14 normal coronaries EF 35-40%  Etiology of cardiomyopathy is uncertain. Likely HTNive. Echo today reviewed personally EF 50% --LV function now low normal range. Much improved but BP high.  --NYHA class I-II symptoms.  --Volume status ok  - Reinforced need for daily weights and reviewed use of sliding scale diuretics. - Med changes as below.   2. HTN: BP elevated --Will resume carvedilol 12.5 bid as well as  spironolactone 12.5. Continue enalapril.  --Will need BP check and BMET in 1 week. Will arrange pharmacy visit  [name]  MD  09/12/2015

## 2015-09-12 NOTE — Patient Instructions (Signed)
Start Carvedilol 12.5 mg twice daily.  Start Spironolactone 12.5mg  daily.  Follow up with Pharmacist in 1 week (bmet,bp check)

## 2015-09-12 NOTE — Progress Notes (Signed)
Echocardiogram 2D Echocardiogram has been performed.  Nolon Rod 09/12/2015, 10:33 AM

## 2015-09-19 ENCOUNTER — Ambulatory Visit (HOSPITAL_COMMUNITY)
Admission: RE | Admit: 2015-09-19 | Discharge: 2015-09-19 | Disposition: A | Discharge status: Home / Self Care | Payer: BLUE CROSS/BLUE SHIELD | Source: Ambulatory Visit | Attending: Cardiology | Admitting: Cardiology

## 2015-09-19 ENCOUNTER — Telehealth (HOSPITAL_COMMUNITY): Payer: Self-pay | Admitting: Pharmacist

## 2015-09-19 DIAGNOSIS — I5022 Chronic systolic (congestive) heart failure: Secondary | ICD-10-CM | POA: Diagnosis present

## 2015-09-19 LAB — BASIC METABOLIC PANEL
Anion gap: 6 (ref 5–15)
BUN: 21 mg/dL — AB (ref 6–20)
CHLORIDE: 108 mmol/L (ref 101–111)
CO2: 25 mmol/L (ref 22–32)
CREATININE: 0.94 mg/dL (ref 0.61–1.24)
Calcium: 9.4 mg/dL (ref 8.9–10.3)
GFR calc Af Amer: >60 mL/min (ref >60)
GLUCOSE: 117 mg/dL — AB (ref 65–99)
POTASSIUM: 3.9 mmol/L (ref 3.5–5.1)
Sodium: 139 mmol/L (ref 135–145)

## 2015-09-19 MED ORDER — SPIRONOLACTONE 25 MG PO TABS: 25.0000 mg | ORAL_TABLET | Freq: Every day | ORAL | Status: DC

## 2015-09-19 NOTE — Telephone Encounter (Signed)
Reviewed medications with [name] today and noted that he is actually taking spironolactone 25 mg daily (instead of prescribed 12.5 mg daily). With stable BMET and improved BP, will continue this dose.   Tyler Deis. Bonnye Fava, PharmD, BCPS, CPP Clinical Pharmacist Pager: [phone] Phone: [phone] 09/19/2015 11:33 AM

## 2015-10-25 ENCOUNTER — Encounter: Payer: Self-pay | Admitting: Internal Medicine

## 2015-10-25 DIAGNOSIS — I10 Essential (primary) hypertension: Secondary | ICD-10-CM

## 2015-10-25 DIAGNOSIS — I5022 Chronic systolic (congestive) heart failure: Secondary | ICD-10-CM

## 2015-10-26 MED ORDER — SPIRONOLACTONE 25 MG PO TABS: 25.0000 mg | ORAL_TABLET | Freq: Every day | ORAL | 5 refills | Status: DC

## 2015-10-26 MED ORDER — ENALAPRIL MALEATE 20 MG PO TABS: 20.0000 mg | ORAL_TABLET | Freq: Two times a day (BID) | ORAL | 3 refills | Status: DC

## 2015-10-30 ENCOUNTER — Encounter: Payer: Self-pay | Admitting: Internal Medicine

## 2015-11-02 ENCOUNTER — Other Ambulatory Visit: Payer: Self-pay | Admitting: Pharmacist

## 2015-11-02 DIAGNOSIS — I10 Essential (primary) hypertension: Secondary | ICD-10-CM

## 2015-11-02 DIAGNOSIS — I5022 Chronic systolic (congestive) heart failure: Secondary | ICD-10-CM

## 2015-11-03 ENCOUNTER — Other Ambulatory Visit: Payer: Self-pay | Admitting: Pharmacist

## 2015-11-03 DIAGNOSIS — I5022 Chronic systolic (congestive) heart failure: Secondary | ICD-10-CM

## 2015-11-03 MED ORDER — ENALAPRIL MALEATE 20 MG PO TABS: 20.0000 mg | ORAL_TABLET | Freq: Two times a day (BID) | ORAL | 0 refills | Status: DC

## 2015-11-03 MED ORDER — SPIRONOLACTONE 25 MG PO TABS: 25.0000 mg | ORAL_TABLET | Freq: Every day | ORAL | 0 refills | Status: DC

## 2015-11-03 NOTE — Progress Notes (Signed)
One-time fills at [facility] through Internal Medicine Program for enalapril and spironolactone (patient recently picked up carvedilol).  Refill requests sent to Dr. Gala Romney for future fills through Heart Failure Program.  Patient notified and verbalized understanding.

## 2015-12-02 ENCOUNTER — Encounter: Payer: Self-pay | Admitting: Internal Medicine

## 2015-12-06 ENCOUNTER — Encounter: Payer: Self-pay | Admitting: Internal Medicine

## 2015-12-07 MED ORDER — SPIRONOLACTONE 25 MG PO TABS: 25.0000 mg | ORAL_TABLET | Freq: Every day | ORAL | 6 refills | Status: DC

## 2015-12-07 MED ORDER — CARVEDILOL 25 MG PO TABS: 12.5000 mg | ORAL_TABLET | Freq: Two times a day (BID) | ORAL | 6 refills | Status: DC

## 2015-12-07 MED ORDER — ENALAPRIL MALEATE 20 MG PO TABS: 20.0000 mg | ORAL_TABLET | Freq: Two times a day (BID) | ORAL | 6 refills | Status: DC

## 2015-12-19 ENCOUNTER — Encounter: Payer: Self-pay | Admitting: Pediatric Intensive Care

## 2015-12-19 DIAGNOSIS — I10 Essential (primary) hypertension: Secondary | ICD-10-CM

## 2015-12-19 NOTE — Congregational Nurse Program (Signed)
Congregational Nurse Program Note  Date of Encounter: 12/19/2015  Past Medical History: Past Medical History:  Diagnosis Date  . Cardiac insufficiency (HCC)   . CHF (congestive heart failure) (HCC)   . Chronic back pain    "neck down" (07/20/2013)  . Chronic systolic heart failure (HCC) 06/15/2014  . GERD (gastroesophageal reflux disease)   . High cholesterol   . Hypertension   . Renal calculus     Encounter Details:     CNP Questionnaire - 12/19/15 1516      Patient Demographics   Is this a new or existing patient? Existing   Patient is considered a/an Immigrant   Race Latino/Hispanic     Patient Assistance   Location of Patient Assistance [place]   Patient's financial/insurance status Self-Pay   Uninsured Patient Yes   Interventions Not Applicable   Patient referred to apply for the following financial assistance [employer] insecurities addressed Not Technical brewer No   Assistance securing medications No   Sales promotion account executive health;Hypertension     Encounter Details   Primary purpose of visit Acute Illness/Condition Visit   Was an Emergency Department visit averted? Not Applicable   Does patient have a medical provider? Yes   Patient referred to Follow up with established PCP   Was a mental health screening completed? (GAINS tool) No   Does patient have dental issues? No   Does patient have vision issues? No   Does your patient have an abnormal blood pressure today? No   Since previous encounter, have you referred patient for abnormal blood pressure that resulted in a new diagnosis or medication change? No   Does your patient have an abnormal blood glucose today? No   Since previous encounter, have you referred patient for abnormal blood glucose that resulted in a new diagnosis or medication change? No   Was there a life-saving intervention made? No     Client requests BP check via BSWI  [place]. Client states that he uses [facility] for primary care and also has a cardiologist who prescribes his medication. Client states that he has plenty of medication at this point. Client states that he feels depressed and wants assistance for treatment. CN recommended telling PCP about depression and symptoms. CN/BSWI also recommended returning to clinic for follow up as needed. CN/BSWI will follow up via phone with client after his PCP appointment.

## 2015-12-22 ENCOUNTER — Telehealth: Payer: Self-pay | Admitting: Internal Medicine

## 2015-12-22 NOTE — Telephone Encounter (Signed)
APT. REMINDER CALL, LMTCB °

## 2015-12-25 ENCOUNTER — Ambulatory Visit (INDEPENDENT_AMBULATORY_CARE_PROVIDER_SITE_OTHER): Payer: Self-pay | Admitting: Internal Medicine

## 2015-12-25 DIAGNOSIS — F332 Major depressive disorder, recurrent severe without psychotic features: Secondary | ICD-10-CM

## 2015-12-25 DIAGNOSIS — F32A Depression, unspecified: Secondary | ICD-10-CM | POA: Insufficient documentation

## 2015-12-25 DIAGNOSIS — F329 Major depressive disorder, single episode, unspecified: Secondary | ICD-10-CM | POA: Insufficient documentation

## 2015-12-25 DIAGNOSIS — F419 Anxiety disorder, unspecified: Secondary | ICD-10-CM | POA: Insufficient documentation

## 2015-12-25 MED ORDER — CITALOPRAM HYDROBROMIDE 10 MG PO TABS: 10.0000 mg | ORAL_TABLET | Freq: Every day | ORAL | 1 refills | Status: DC

## 2015-12-25 NOTE — Patient Instructions (Addendum)
Please take Citalopram every evening before bed. Please schedule a follow up appointment in 4 weeks. For your therapy, please call and make an appointment with one of the following:  [facility] [phone] [address] [place], [place] [address]  Vesta Mixer [phone] [address] [place], [place] [address]  If you have any questions or concerns, call our clinic at [phone] or after hours call [phone] and ask for the internal medicine resident on call. Thank you!   Trastorno Location manager (Major Depressive Disorder) El trastorno depresivo mayor es una enfermedad mental. Tambin se llamado depresin clnica o depresin unipolar. Produce sentimientos de tristeza, desesperanza o desamparo. Algunas personas con trastorno depresivo mayor no se sienten particularmente tristes, pero pierden Press photographer las cosas que solan disfrutar (anhedonia). Tambin puede causar sntomas fsicos. Interfiere en el trabajo, la escuela, las relaciones y otras actividades diarias normales. Puede variar en gravedad, pero es ms duradera y ms grave que la tristeza que todos sentimos de vez en cuando en nuestras vidas.  Muchas veces es desencadenada por sucesos estresantes o cambios importantes en la vida. Algunos ejemplos de estos factores desencadenantes son el divorcio, la prdida del trabajo o el hogar, [place], y la muerte de un familiar o amigo cercano. A veces aparece sin ninguna razn evidente. Las personas que tienen familiares con depresin mayor o con trastorno bipolar tienen ms riesgo de desarrollar depresin mayor con o sin factores de Librarian, academic. Puede ocurrir a Actuary. Puede ocurrir slo una vez en su vida(episodio nico de trastorno depresivo mayor). Puede ocurrir varias veces (trastorno depresivo mayor recurrente).  SNTOMAS  Las personas con trastorno depresivo mayor presentan anhedonia o estado de nimo deprimido casi [employer] durante al  menos 2 semanas o ms. Los sntomas son:   Sensacin de tristeza Designer, jewellery) o vaco.  Sentimientos de desesperanza o desamparo.  Lagrimeo o episodios de llanto ( es observado por los dems).  Irritabilidad (en nios y adolescentes). Adems del estado de nimo deprimido o la anhedonia o ambos, estos enfermos tienen al menos cuatro de los siguientes sntomas :   Dificultad para dormir o Engineer, site.   Cambio significativo (aumento o disminucin) en el apetito o Education officer, community.   Falta de energa o motivacin.  Sentimientos de culpa o desvalorizacin.   Dificultad para concentrarse, recordar o tomar decisiones.  Movimientos inusualmente lentos (retardo psicomotor retardation) o inquietud (segn lo observado por los dems).   Deseos recurrentes de muerte, pensamientos recurrentes de autoagresin (suicidio) o intento de suicidio. Las personas con trastorno depresivo mayor suelen tener pensamientos persistentes negativos acerca de s mismos, de otras personas y del mundo. Las personas con trastorno depresivo mayor grave pueden experimentar creencias o percepciones distorsionadas sobre el mundo (delirios psicticos). Tambin pueden ver u or cosas que no son reales(alucinaciones psicticas).  DIAGNSTICO  El diagnstico se realiza mediante una evaluacin hecha por el mdico. El mdico le preguntar acerca de los aspectos de su vida cotidiana, como el [place] de nimo, el sueo y el apetito, para ver si usted tiene los sntomas de Animator. Le har preguntas sobre su historial mdico y el consumo de alcohol o drogas, incluyendo medicamentos recetados. [employer] un examen fsico y Education administrator anlisis de [place]. Esto se debe a que ciertas enfermedades y el uso de determinadas sustancias pueden causar sntomas similares a la depresin (depresin secundaria). Su mdico tambin podra derivarlo a Proofreader salud mental para Neomia Dear evaluacin y [place].  TRATAMIENTO  Es  importante reconocer los sntomas y Forensic scientistbuscar tratamiento. Los siguientes tratamientos pPublic librarianueden indicarse:    Medicamentos - Generalmente se recetan antidepresivos. Los antidepresivos se piensa que corrigen los desequilibrios qumicos en el cerebro que se asocian comnmente a la depresin Forensic scientistmayor. Se pueden agregar otros tipos de medicamentos si los sntomas no responden a los antidepresivos solos o si hay ideas delirantes o alucinaciones psicticas.  Psicoterapia - ciertos tipos de psicoterapia pueden ser tiles en el tratamiento del trastorno de la depresin mayor, proporcionando apoyo, educacin y Optometristorientacin. Ciertos tipos de psicoterapia tambin pueden ayudar a superar los pensamientos negativos (terapia cognitivo conductual) y a problemas de relacin que desencadena la depresin mayor (terapia interpersonal). Un especialista en salud mental puede ayudarlo a determinar qu tratamiento es el mejor para usted. La Harley-Davidsonmayora de los pacientes mejoran con una combinacin de medicacin y psicoterapia. Los tratamientos que implican la estimulacin elctrica del cerebro pueden ser utilizados en situaciones con sntomas muy graves o cuando los medicamentos y la psicoterapia no funcionan despus de un [place]iempo. Estos tratamientos incluyen terapia electroconvulsiva, estimulacin magntica transcraneal y estimulacin del nervio vago.    Esta informacin no tiene Theme park managercomo fin reemplazar el consejo del mdico. Asegrese de hacerle al mdico cualquier pregunta que tenga.   Document Released: 07/06/2012 Document Revised: 04/01/2014 Elsevier Interactive Patient Education [employer]2016 Elsevier Inc.

## 2015-12-25 NOTE — Assessment & Plan Note (Signed)
Patient describes worsening depression and anhedonia for the past year. Depression PHQ score of 15 today in clinic. He endorses suicidal ideation but denies plan. He denies HI and audiovisual hallucinations. Patient has a history of a prior suicide attempt in his 72s after medication overdose. He has never taken an antidepressant and has not followed up with psychiatry since. He has been doing well until he was separated from his family 1 year ago. He lives at home with his 77 year old son and does not have access to firearms. Discussed with patient the risks and benefits of treatment with SSRI as well as the option for talk therapy.  -- Started citalopram 10 mg daily. Will up titrate as clinically indicated.  -- Gave patient resources for talk therapy ([facility]) and instructed to call for an appointment  -- Patient has agreed to call our clinic number or 911 if he has thoughts or plans of harming himself -- Follow up in 4 weeks

## 2015-12-25 NOTE — Progress Notes (Signed)
   CC: Depression  HPI:   [name] is a 50 y.o. who presents today with complaint of depression, lack of motivation, and anhedonia for the past 6-8 months. Patient says he separated from his family one year ago and has become increasingly depressed every since. He endorses suicidal ideation consistent with a general death wish but denies any active plan. He is very tearful. He denies homicidal ideation. He lives at home with his 48 year old son. There are no guns in the house and patient feels safe at home. He denies audiovisual hallucinations. He does not drink alcohol and denies illicit drug use. Patient has never been treated with an antidepressant but endorses a prior suicide attempt in his 3s. He says he attempted to overdose on a medication and was taken to the hospital. He was evaluated by a psychiatrist at that time but has not follow up. He has had no issues until this past year. History was provided by the patient with the help of an interpreter.   Past Medical History:  Diagnosis Date  . Cardiac insufficiency (HCC)   . CHF (congestive heart failure) (HCC)   . Chronic back pain    "neck down" (07/20/2013)  . Chronic systolic heart failure (HCC) 06/15/2014  . GERD (gastroesophageal reflux disease)   . High cholesterol   . Hypertension   . Renal calculus     Review of Systems:  All pertinent listed in HPI, otherwise negative.   Physical Exam:  Vitals:   12/25/15 1401  BP: 127/68  Pulse: 69  Temp: 98 F (36.7 C)  TempSrc: Oral  SpO2: 100%  Weight: 205 lb 14.4 oz (93.4 kg)   Physical Exam Constitutional: NAD, appears comfortable HEENT: R corneal scarring, patient wearing eye patch.  Cardiovascular: RRR, no murmurs, rubs, or gallops.  Pulmonary/Chest: CTAB, no wheezes, rales, or rhonchi.  Abdominal: Soft, non tender, non distended. +BS.  Neurological: A&Ox3, CN II - XII grossly intact.  Mental Status Exam: - Appearance & Behavior: well groomed,  cooperative - Motor activity: Normal - Speech: normal rate - Mood & Affect: Depressed mood with appropriate affect - Thought process: Linear and goal-directed  - Thought content: Endorses SI without plan, denies HI - Good insight    Assessment & Plan:   See Encounters Tab for problem based charting.  Patient seen with Dr. Rogelia Boga

## 2016-01-02 ENCOUNTER — Encounter: Payer: Self-pay | Admitting: Internal Medicine

## 2016-01-04 ENCOUNTER — Encounter: Payer: Self-pay | Admitting: Pediatric Intensive Care

## 2016-01-22 ENCOUNTER — Ambulatory Visit (INDEPENDENT_AMBULATORY_CARE_PROVIDER_SITE_OTHER): Payer: Self-pay | Admitting: Internal Medicine

## 2016-01-22 ENCOUNTER — Encounter: Payer: Self-pay | Admitting: Internal Medicine

## 2016-01-22 DIAGNOSIS — F332 Major depressive disorder, recurrent severe without psychotic features: Secondary | ICD-10-CM

## 2016-01-22 DIAGNOSIS — R0789 Other chest pain: Secondary | ICD-10-CM

## 2016-01-22 MED ORDER — CITALOPRAM HYDROBROMIDE 20 MG PO TABS: 10.0000 mg | ORAL_TABLET | Freq: Every day | ORAL | 2 refills | Status: DC

## 2016-01-22 NOTE — Progress Notes (Signed)
   CC: Depression follow up  HPI:  [name] is a 50 y.o. M who presents today for follow up of his depression. He was last seen in clinic one month ago at which time he endorsed worsening depression and suicidal ideation without plan. He was started on citalopram 10 mg daily. Today he reports he is tolerating this medicine well. He says his depression is much better and he feels like the medicine is helping. He now denies SI and says he is sleeping through the night. He does endorse multiple episodes of chest tightness over the last few days. Patient is wondering if this is related to his anxiety. He describes the chest pain as a pressure across his chest. It usually last for about 45 minutes an comes on at random, often at rest. It does not radiate. He was seen in the ED in April of this year for a similar complaint and was told it was likely related to his gastritis. Ischemic workup at that time was negative. History was obtained with the help of a translator.   Past Medical History:  Diagnosis Date  . Cardiac insufficiency (HCC)   . CHF (congestive heart failure) (HCC)   . Chronic back pain    "neck down" (07/20/2013)  . Chronic systolic heart failure (HCC) 06/15/2014  . GERD (gastroesophageal reflux disease)   . High cholesterol   . Hypertension   . Renal calculus     Review of Systems:  All pertinents listed in the HPI. Otherwise negative.   Vitals:   01/22/16 1500  BP: 118/71  Pulse: (!) 59  Temp: 98.2 F (36.8 C)  SpO2: 100%  Weight: 196 lb 9.6 oz (89.2 kg)  Height: 5\' 8"  (1.727 m)   Physical Exam Constitutional: NAD, appears comfortable HEENT: R corneal scarring, patient wearing eye patch.  Cardiovascular: RRR Pulmonary/Chest: CTAB Mental Status Exam: - Appearance & Behavior: well groomed, cooperative - Motor activity: Normal - Speech: normal rate - Mood & Affect: euthymic mood with appropriate affect - Thought process: Linear and goal-directed  -  Thought content: Denies SI and HI  - Good insight   Assessment & Plan:   See Encounters Tab for problem based charting.  Patient seen with Dr. Josem Kaufmann

## 2016-01-22 NOTE — Patient Instructions (Addendum)
Your citalopram (celexa) dose has been increased to 20 mg daily. You may take two pills every night of your current prescription and pick up your new prescription when you run out. A new prescription has been sent to your pharmacy. Please follow up in 3 months or sooner if you have issues. If you have any questions or concerns, call our clinic at [phone] or after hours call [phone] and ask for the internal medicine resident on call. Thank you!

## 2016-01-22 NOTE — Assessment & Plan Note (Addendum)
Mood has improved significantly since starting citalopram x 1 month. He is tolerating the medicine well without side effects. He says he no longer feels depressed and now denies SI. He does endorse some generalized anxiety. He is having intermittent episodes of chest tightness he believes is related to his anxiety. No identifiable triggers.  -- Will increase citalopram to 20 mg daily -- Follow up in 3 months

## 2016-01-23 NOTE — Assessment & Plan Note (Signed)
Patient endorses intermittent episodes of chest tightness over the past 5 days. Episodes occur at random, often at rest. The pressure is non radiating and non exertional. No identifiable trigger. It last for about 45 minutes and resolves without intervention. Patient has a history of HTN and nonischemic cardiomyopathy and follows with Dr. Gala Romney. Per cardiology clinic note, LHC 05/26/14 showed normal coronaries and EF 35-40%.  Etiology of cardiomyopathy is uncertain but thought to be HTNive. Patient had a recent ED visit in April of this year for a similar complaint of chest tightness and ischemic work up at that time was negative. His only other coronary risk factor is age. He is a non smoker. Etiology is likely under treated anxiety. Patient reports improvement in his depression since starting citalopram last month but continues to endorse symptoms of generalized anxiety.  -- Reassured patient that chest tightness is atypical and unlikely to be ACS  -- Will increase dose of citalopram today to see if chest tightness improves

## 2016-01-27 NOTE — Progress Notes (Signed)
I saw and evaluated the patient.  I personally confirmed the key portions of Dr. Verdia Kuba history and exam and reviewed pertinent patient test results.  The assessment, diagnosis, and plan were formulated together and I agree with the documentation in the resident's note.

## 2016-02-01 NOTE — Congregational Nurse Program (Signed)
Congregational Nurse Program Note  Date of Encounter: 01/04/2016  Past Medical History: Past Medical History:  Diagnosis Date  . Cardiac insufficiency (HCC)   . CHF (congestive heart failure) (HCC)   . Chronic back pain    "neck down" (07/20/2013)  . Chronic systolic heart failure (HCC) 06/15/2014  . GERD (gastroesophageal reflux disease)   . High cholesterol   . Hypertension   . Renal calculus     Encounter Details:     CNP Questionnaire - 01/04/16 1600      Patient Demographics   Is this a new or existing patient? Existing   Patient is considered a/an Immigrant   Race Latino/Hispanic     Patient Assistance   Location of Patient Assistance [employer]   Patient's financial/insurance status Self-Pay (Uninsured)   Uninsured Patient (Orange Research officer, trade union) Yes   Interventions Appt. has been completed   Patient referred to apply for the following financial assistance [employer] insecurities addressed Not Applicable   Transportation assistance No   Assistance securing medications No   Educational health offerings Hypertension     Encounter Details   Primary purpose of visit Chronic Illness/Condition Visit   Was an Emergency Department visit averted? Not Applicable   Does patient have a medical provider? Yes   Patient referred to Follow up with established PCP   Was a mental health screening completed? (GAINS tool) No   Does patient have dental issues? No   Does patient have vision issues? No   Does your patient have an abnormal blood pressure today? No   Since previous encounter, have you referred patient for abnormal blood pressure that resulted in a new diagnosis or medication change? No   Does your patient have an abnormal blood glucose today? No   Since previous encounter, have you referred patient for abnormal blood glucose that resulted in a new diagnosis or medication change? No   Was there a life-saving intervention made? No          Amb Nursing Assessment - 01/22/16 1625      Pre-visit preparation   Pre-visit preparation completed Yes     Pain Assessment   Pain Assessment No/denies pain   Pain Score 0-No pain     Nutrition Screen   BMI - recorded 29.29   Nutritional Status BMI 25 -29 Overweight   Nutritional Risks None   Diabetes No     Functional Status   Activities of Daily Living Independent   Ambulation Independent   Medication Administration Independent   Home Management Independent     Risk/Barriers  Assessment   Barriers to Care Management & Learning Language;Sensory defecits   Sensory deficits Vision     Abuse/Neglect Assessment   Do you feel unsafe in your current relationship? No   Do you feel physically threatened by others? No   Anyone hurting you at home, work, or school? No   Unable to ask? No     Patient Literacy   How often do you need to have someone help you when you read instructions, pamphlets, or other written materials from your doctor or pharmacy? 1 - Never   What is the last grade level you completed in school? 12th grade did not finish college     Web designer Needed? Yes   Interpreter Agency [employer]   Interpreter Name Lars Mage   Interpreter [place] [id number]   Patient Declined Interpreter  No   Patient signed [facility] waiver No     Comments   Information entered by : Filomena JunglingMamie Hague nt1     Client in for flu shot and BP check. Follow up with CN as needed.

## 2016-02-15 IMAGING — CR DG CHEST 1V PORT
1 series · 1 of 1 positions shown · non-contrast
Comparison: PA and lateral chest 03/18/2014.

CLINICAL DATA: Left chest pain.  Left hand numbness.

EXAM:
PORTABLE CHEST - 1 VIEW

[AP]
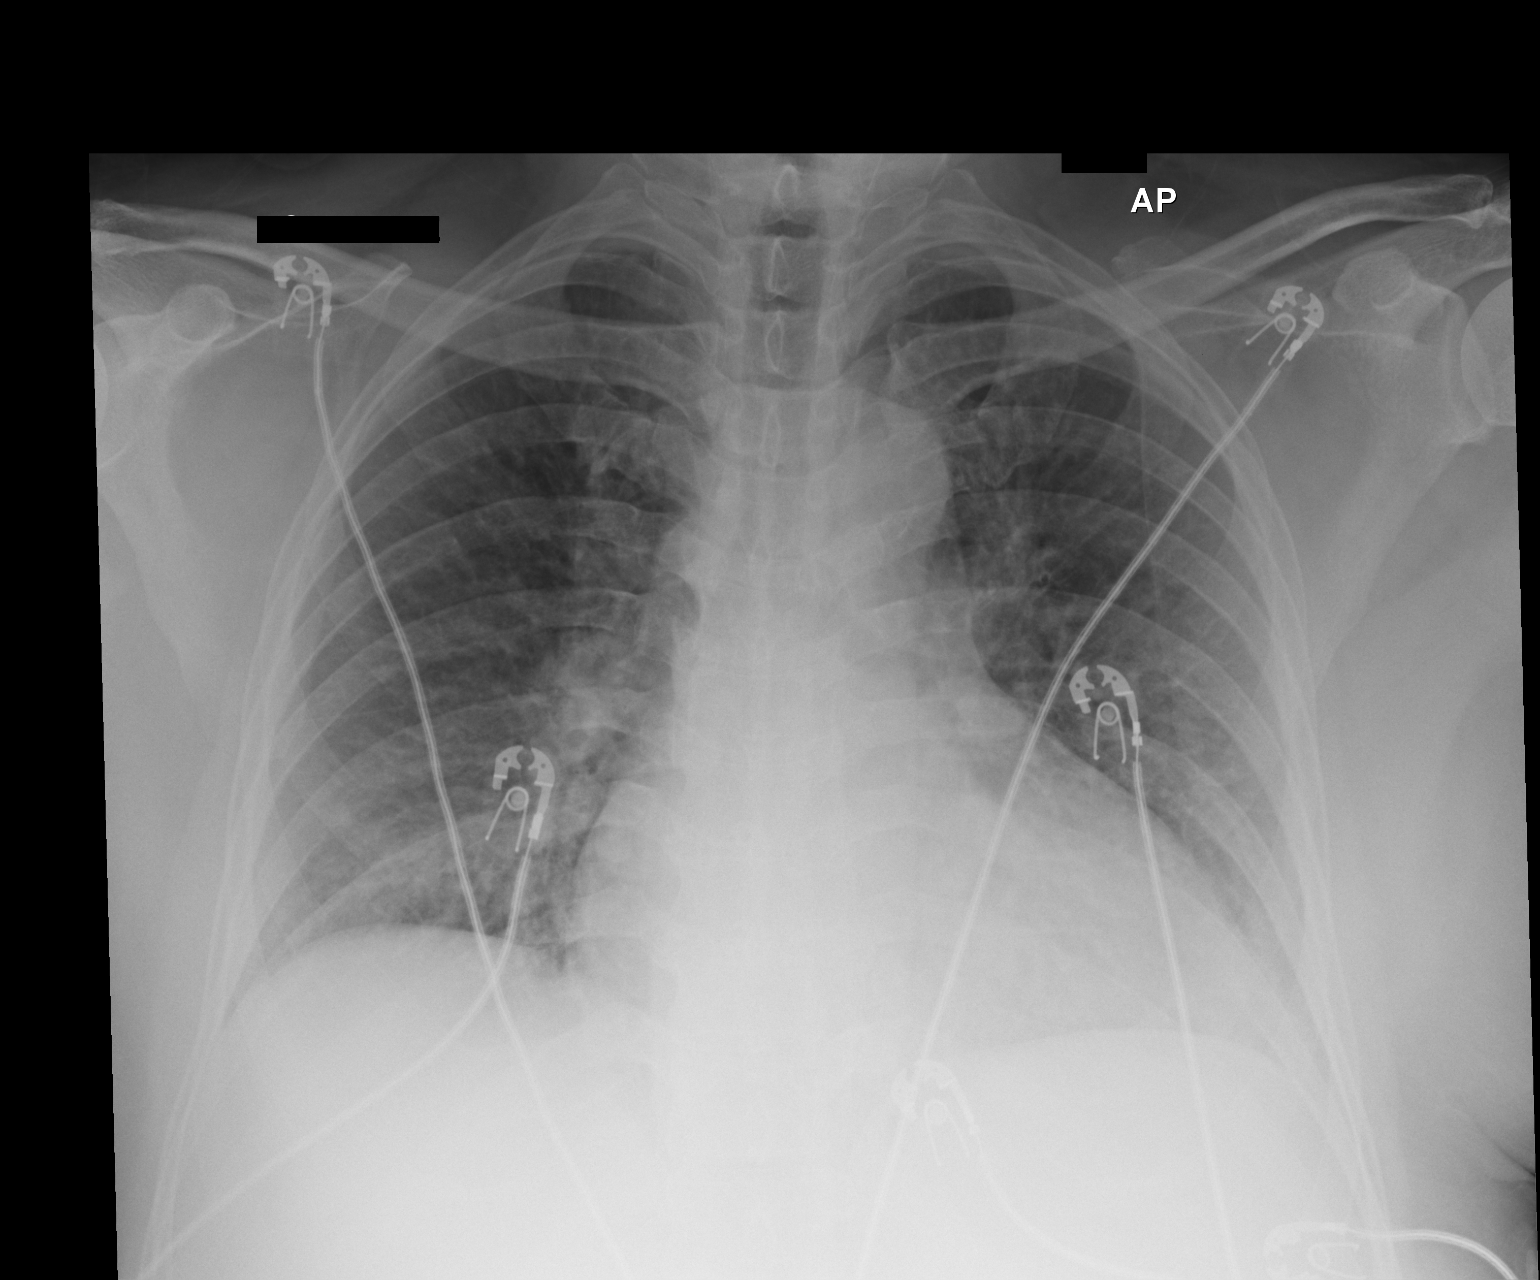

[1 of 1 positions shown; findings below may reference images not displayed]

FINDINGS: Lung volumes are lower than on the comparison study with crowding of
the bronchovascular structures. Lungs are clear. Heart size is
normal. No pneumothorax or pleural effusion.
IMPRESSION: No acute disease.

## 2016-02-22 IMAGING — CR DG CHEST 2V
2 series · 2 of 2 positions shown · non-contrast
Comparison: Single view of the chest 05/02/2014. PA and lateral
chest 03/19/2015.

CLINICAL DATA: Left chest pain extra side left chest and arm pain
tonight. Shortness of breath.

EXAM:
CHEST  2 VIEW

[chest pa]
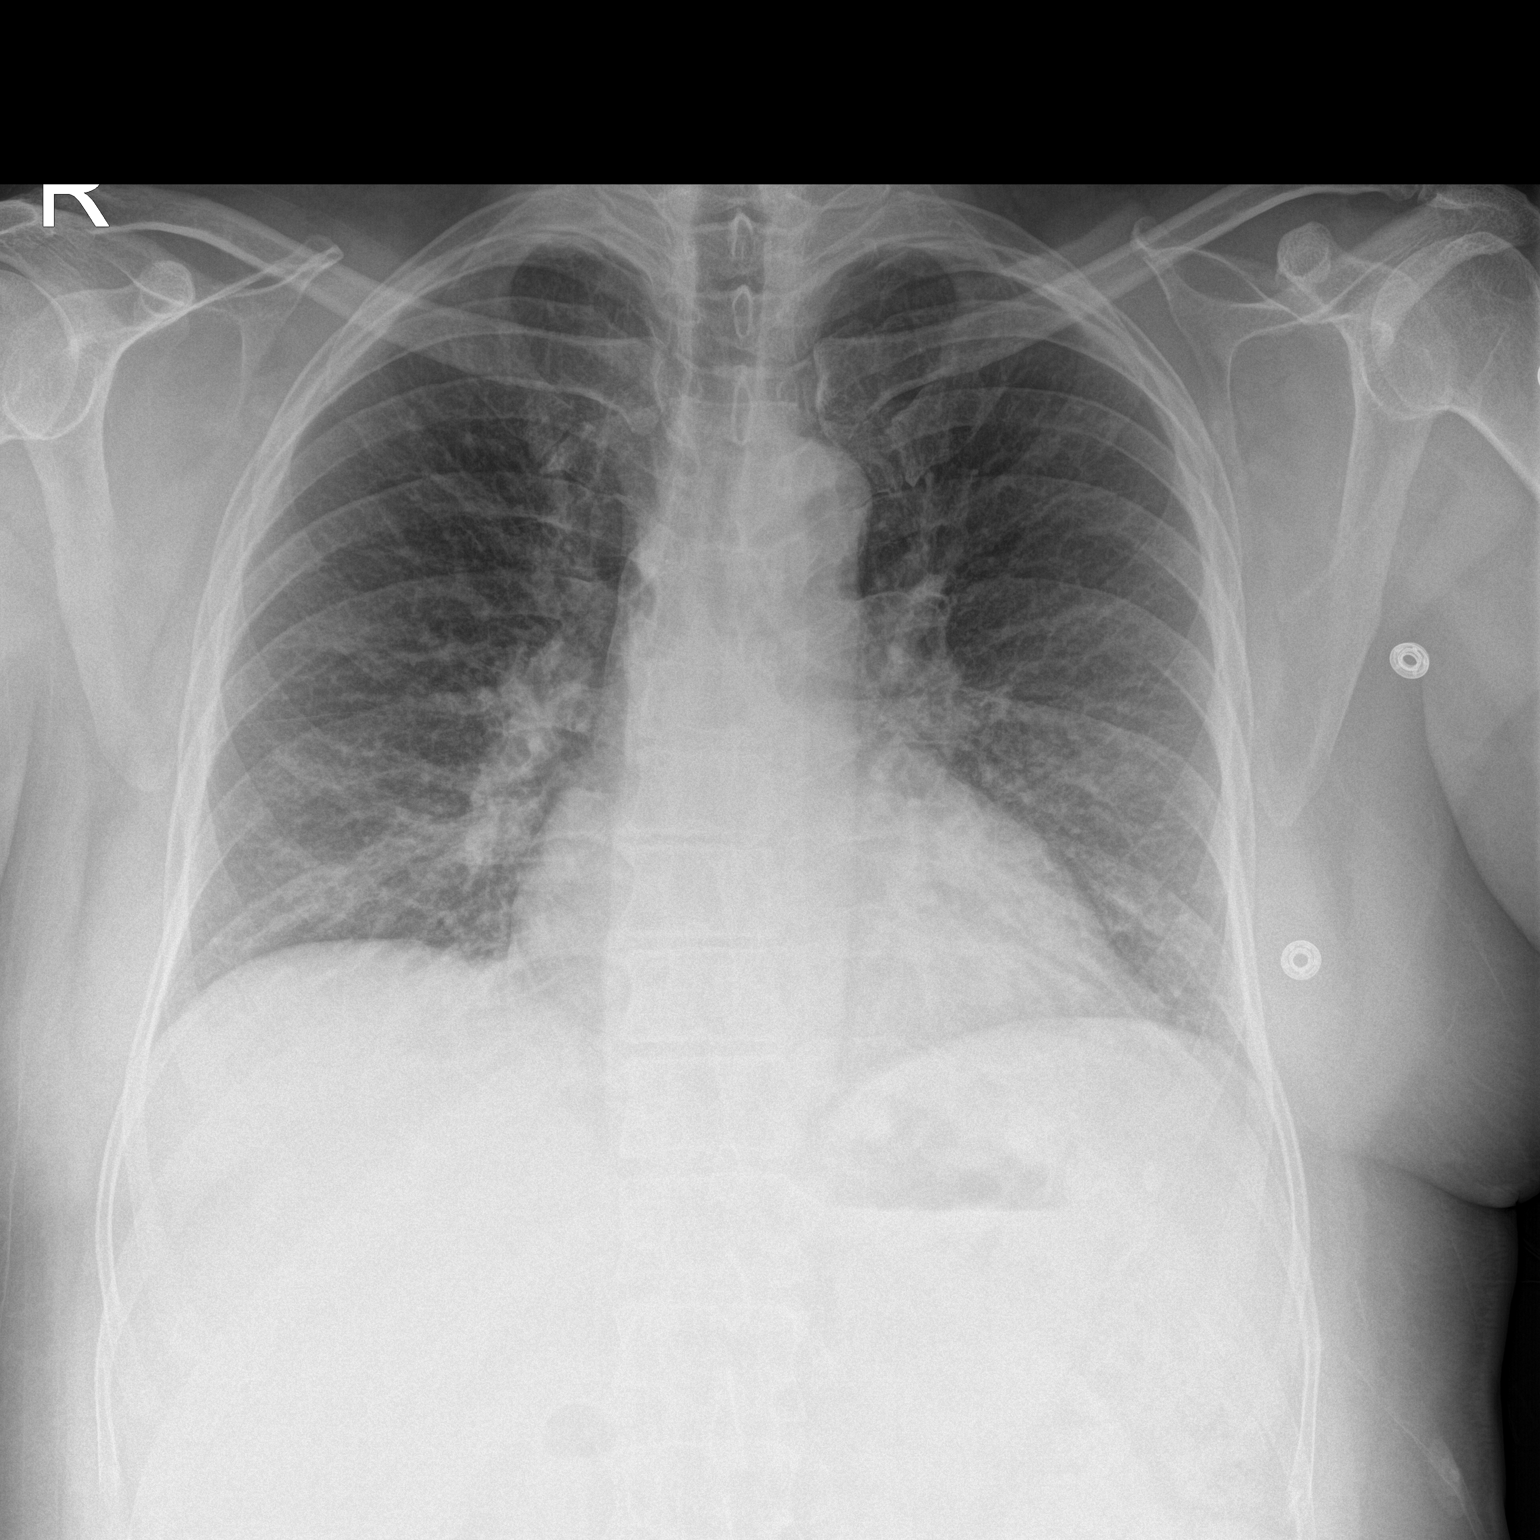

[chest lat]
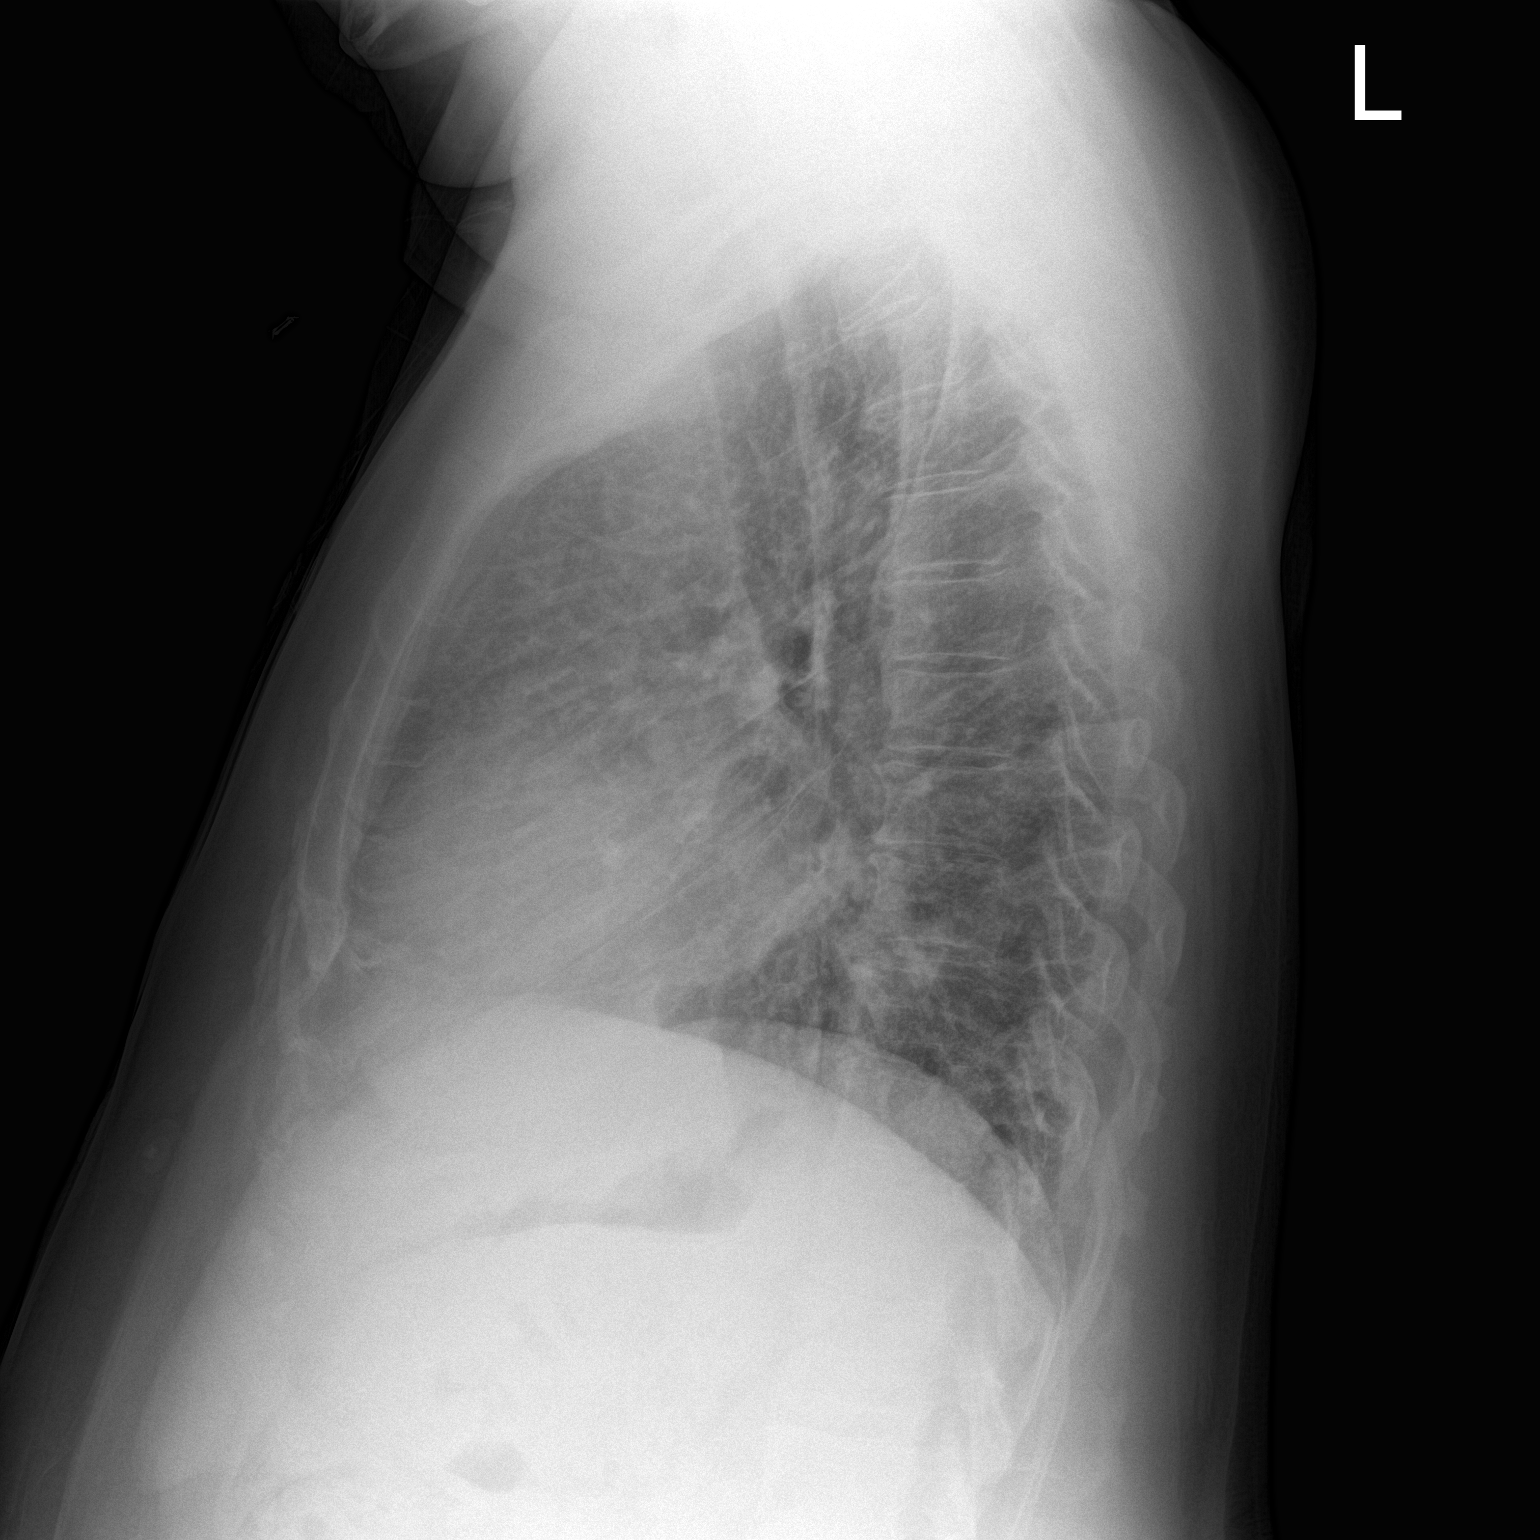

[2 of 2 positions shown; findings below may reference images not displayed]

FINDINGS: Lung volumes somewhat low with crowding of the bronchovascular
structures. No consolidative process, pneumothorax or effusion is
identified. No focal bony abnormality.
IMPRESSION: No acute finding in a low volume chest.

## 2016-02-29 ENCOUNTER — Encounter: Payer: Self-pay | Admitting: Pediatric Intensive Care

## 2016-03-14 ENCOUNTER — Encounter: Payer: Self-pay | Admitting: Pediatric Intensive Care

## 2016-04-02 NOTE — Congregational Nurse Program (Signed)
Congregational Nurse Program Note  Date of Encounter: 03/14/2016  Past Medical History: Past Medical History:  Diagnosis Date  . Cardiac insufficiency (HCC)   . CHF (congestive heart failure) (HCC)   . Chronic back pain    "neck down" (07/20/2013)  . Chronic systolic heart failure (HCC) 06/15/2014  . GERD (gastroesophageal reflux disease)   . High cholesterol   . Hypertension   . Renal calculus     Encounter Details:     CNP Questionnaire - 03/14/16 1400      Patient Demographics   Is this a new or existing patient? Existing   Patient is considered a/an Immigrant   Race Latino/Hispanic     Patient Assistance   Location of Patient Assistance [employer]   Patient's financial/insurance status Self-Pay (Uninsured)   Uninsured Patient (Orange Research officer, trade union) Yes   Interventions Not Applicable   Patient referred to apply for the following financial assistance [employer] insecurities addressed Not Technical brewer No   Assistance securing medications Yes   Type of Assistance [facility]   Educational health offerings Hypertension;Medications;Navigating the healthcare system     Encounter Details   Primary purpose of visit Navigating the Healthcare System;Chronic Illness/Condition Visit   Was an Emergency Department visit averted? Not Applicable   Does patient have a medical provider? Yes   Patient referred to Follow up with established PCP   Was a mental health screening completed? (GAINS tool) No   Does patient have dental issues? No   Does patient have vision issues? No   Does your patient have an abnormal blood pressure today? No   Since previous encounter, have you referred patient for abnormal blood pressure that resulted in a new diagnosis or medication change? No   Does your patient have an abnormal blood glucose today? No   Since previous encounter, have you referred patient for abnormal blood glucose that  resulted in a new diagnosis or medication change? No   Was there a life-saving intervention made? No     BP check- client needs to know if he has refills for medication at [facility].

## 2016-04-02 NOTE — Congregational Nurse Program (Signed)
Congregational Nurse Program Note  Date of Encounter: 02/29/2016  Past Medical History: Past Medical History:  Diagnosis Date  . Cardiac insufficiency (HCC)   . CHF (congestive heart failure) (HCC)   . Chronic back pain    "neck down" (07/20/2013)  . Chronic systolic heart failure (HCC) 06/15/2014  . GERD (gastroesophageal reflux disease)   . High cholesterol   . Hypertension   . Renal calculus     Encounter Details:     CNP Questionnaire - 03/14/16 1400      Patient Demographics   Is this a new or existing patient? Existing   Patient is considered a/an Immigrant   Race Latino/Hispanic     Patient Assistance   Location of Patient Assistance [employer]   Patient's financial/insurance status Self-Pay (Uninsured)   Uninsured Patient (Orange Research officer, trade union) Yes   Interventions Not Applicable   Patient referred to apply for the following financial assistance [employer] insecurities addressed Not Technical brewer No   Assistance securing medications Yes   Type of Assistance [facility]   Educational health offerings Hypertension;Medications;Navigating the healthcare system     Encounter Details   Primary purpose of visit Navigating the Healthcare System;Chronic Illness/Condition Visit   Was an Emergency Department visit averted? Not Applicable   Does patient have a medical provider? Yes   Patient referred to Follow up with established PCP   Was a mental health screening completed? (GAINS tool) No   Does patient have dental issues? No   Does patient have vision issues? No   Does your patient have an abnormal blood pressure today? No   Since previous encounter, have you referred patient for abnormal blood pressure that resulted in a new diagnosis or medication change? No   Does your patient have an abnormal blood glucose today? No   Since previous encounter, have you referred patient for abnormal blood glucose that  resulted in a new diagnosis or medication change? No   Was there a life-saving intervention made? No     BP check

## 2016-05-30 ENCOUNTER — Encounter: Payer: Self-pay | Admitting: Pediatric Intensive Care

## 2016-07-02 NOTE — Congregational Nurse Program (Signed)
Congregational Nurse Program Note  Date of Encounter: 05/30/2016  Past Medical History: Past Medical History:  Diagnosis Date  . Cardiac insufficiency (HCC)   . CHF (congestive heart failure) (HCC)   . Chronic back pain    "neck down" (07/20/2013)  . Chronic systolic heart failure (HCC) 06/15/2014  . GERD (gastroesophageal reflux disease)   . High cholesterol   . Hypertension   . Renal calculus     Encounter Details:  Via interpreter Jomarie Longs, discussed causes of hypertension and diet changes which can be made to control BP. Client has follow up appointment with provider next month and is regularly taking medication. Follow up with CN for BP check as needed.

## 2016-07-08 ENCOUNTER — Encounter: Payer: Self-pay | Admitting: Internal Medicine

## 2016-07-08 ENCOUNTER — Ambulatory Visit (INDEPENDENT_AMBULATORY_CARE_PROVIDER_SITE_OTHER): Payer: BLUE CROSS/BLUE SHIELD | Admitting: Internal Medicine

## 2016-07-08 VITALS — BP 121/63 | HR 70 | Temp 98.2°F | Wt 213.3 lb

## 2016-07-08 DIAGNOSIS — Z79899 Other long term (current) drug therapy: Secondary | ICD-10-CM

## 2016-07-08 DIAGNOSIS — F332 Major depressive disorder, recurrent severe without psychotic features: Secondary | ICD-10-CM

## 2016-07-08 DIAGNOSIS — F3340 Major depressive disorder, recurrent, in remission, unspecified: Secondary | ICD-10-CM | POA: Diagnosis not present

## 2016-07-08 DIAGNOSIS — I10 Essential (primary) hypertension: Secondary | ICD-10-CM | POA: Diagnosis not present

## 2016-07-08 DIAGNOSIS — Z947 Corneal transplant status: Secondary | ICD-10-CM | POA: Diagnosis not present

## 2016-07-08 NOTE — Assessment & Plan Note (Signed)
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 He last followed up in 2016. Patient wears an eye patch over the affected eye because of constant irritation and light sensitivity. He is concerned today that he still has stiches in his eye. Exam with ophthalmoscope revealed some corneal scarring but no evidence of retained sutures. Patient reports he has transportation issues and cannot make it to [facility] to follow up. Will refer to a local ophthalmologist for further evaluation.  -- Ophthalmology referral

## 2016-07-08 NOTE — Patient Instructions (Addendum)
[name],  It was a pleasure to see you today. I have referred you to a local eye doctor for your eye pain. Please continue to take your blood pressure medicine as previously prescribed. It is ok to stop the depression medicine, citalopram. If you depression returns, please give me a call and let me know. Otherwise, you may follow up in 6 months or sooner if you need me. If you have any questions or concerns, call our clinic at [phone] or after hours call [phone] and ask for the internal medicine resident on call. Thank you!  - Dr. Antony Contras    Phillip [name],  Fue un placer verte hoy. Lo he derivado a un Clinical biochemist para su dolor de ojo. Contine tomando su medicamento para la presin arterial segn lo recetado previamente. Est bien dejar el medicamento para la depresin, citalopram. Si su depresin regresa, llmeme y avseme. De lo contrario, puede hacer un seguimiento en 6 meses o antes si me necesita. Si tiene alguna pregunta o inquietud, llame a [name] al [phone] o despus del horario de atencin llame al [phone] y pregunte por el residente de medicina interna de turno. Gracias!  - Dr. Antony Contras

## 2016-07-08 NOTE — Assessment & Plan Note (Signed)
Well controlled today, BP 121/63. Continue current regimen.  -- Carvedilol 12.5 mg BID -- Enalapril 20 mg BID -- Spironolactone 25 mg daily

## 2016-07-08 NOTE — Progress Notes (Signed)
   CC: Depression follow up   HPI:  [name] is a 51 y.o. male with past medical history outlined below here for Depression follow up. For the details of today's visit, please refer to the assessment and plan.  Past Medical History:  Diagnosis Date  . Cardiac insufficiency (HCC)   . CHF (congestive heart failure) (HCC)   . Chronic back pain    "neck down" (07/20/2013)  . Chronic systolic heart failure (HCC) 06/15/2014  . GERD (gastroesophageal reflux disease)   . High cholesterol   . Hypertension   . Renal calculus     Review of Systems:  All pertinents listed in HPI, otherwise negative  Physical Exam:  Vitals:   07/08/16 1322  BP: 121/63  Pulse: 70  Temp: 98.2 F (36.8 C)  TempSrc: Oral  SpO2: 100%  Weight: 213 lb 4.8 oz (96.8 kg)    Constitutional: NAD, appears comfortable HEENT: Atraumatic, normocephalic. PERRL, anicteric sclera. Right eye exam with ophthalmoscope reveals corneal scarring, no evidence of retained sutures or foreign objects. Left eye appears normal. Fundus and retinal artery only minimally visualized.  Cardiovascular: RRR, no murmurs, rubs, or gallops.  Pulmonary/Chest: CTAB, no wheezes, rales, or rhonchi Abdominal: Soft, non tender, non distended. +BS.  Extremities: Warm and well perfused. No edema.  Neurological: A&Ox3, CN II - XII grossly intact.  Psychiatric: Normal mood and affect  Assessment & Plan:   See Encounters Tab for problem based charting.  Patient discussed with Dr. Oswaldo Done

## 2016-07-08 NOTE — Assessment & Plan Note (Signed)
Patient was seen in October of 2017 for depression with suicidal ideation. He was started on citalopram 10 mg daily. He followed up 1 month later and noted significant improvement in his mood, but continued to experience symptoms of generalized anxiety and his dose was increased to 20 mg daily. Since his last visit, patient reports that all of his symptoms resolved and he stopped the medicine one month ago because he felt like he not longer needed it. Today he denies depressed mood, anxiety, and suicidal ideation. He reports he has gained a little weight but is overall very happy with his life. Patient has agreed to inform me if his depression or suicidal thoughts return. Since he is doing well off the medicine, we will hold off on treatment for now.  -- If depression resumes, please restart citalopram as he responded well to this medicine in the past  -- Continue to monitor off treatment for now

## 2016-07-09 NOTE — Progress Notes (Signed)
Internal Medicine Clinic Attending  Case discussed with Dr. Guilloud at the time of the visit.  We reviewed the resident's history and exam and pertinent patient test results.  I agree with the assessment, diagnosis, and plan of care documented in the resident's note.  

## 2016-07-22 ENCOUNTER — Telehealth: Payer: Self-pay | Admitting: *Deleted

## 2016-07-22 NOTE — Telephone Encounter (Signed)
UNABLE TO LEAVE MESSAGE ON PATIENT PHONE/ FULL.  CALLED WIFE NUMBER , LEFT MESSAGE FOR Phillip [name] TO CALL CLINIC. [facility] BEEN TRYING TO GET IN CONTACT WITH HIM / PHONE FULL.  [phone].

## 2016-10-03 NOTE — Addendum Note (Signed)
Addended by: Neomia Dear on: 10/03/2016 06:11 PM   Modules accepted: Orders

## 2016-10-28 ENCOUNTER — Encounter: Payer: Self-pay | Admitting: Internal Medicine

## 2016-10-28 ENCOUNTER — Ambulatory Visit (INDEPENDENT_AMBULATORY_CARE_PROVIDER_SITE_OTHER): Payer: BLUE CROSS/BLUE SHIELD | Admitting: Internal Medicine

## 2016-10-28 VITALS — BP 132/69 | HR 73 | Temp 98.3°F | Ht 68.0 in | Wt 207.6 lb

## 2016-10-28 DIAGNOSIS — R21 Rash and other nonspecific skin eruption: Secondary | ICD-10-CM | POA: Insufficient documentation

## 2016-10-28 MED ORDER — TRIAMCINOLONE ACETONIDE 0.1 % EX CREA: TOPICAL_CREAM | CUTANEOUS | 0 refills | Status: DC

## 2016-10-28 NOTE — Progress Notes (Signed)
   CC: Skin rash and itching  HPI:  [name] is a 51 y.o.  male with a past medical history of conditions listed below presenting to the clinic complaining of skin rash and itching. Please see problem based charting for the status of the patient's current and chronic medical conditions.   Past Medical History:  Diagnosis Date  . Cardiac insufficiency (HCC)   . CHF (congestive heart failure) (HCC)   . Chronic back pain    "neck down" (07/20/2013)  . Chronic systolic heart failure (HCC) 06/15/2014  . GERD (gastroesophageal reflux disease)   . High cholesterol   . Hypertension   . Renal calculus    Review of Systems: Pertinent positives mentioned in HPI. Remainder of all ROS negative.   Physical Exam:  Vitals:   10/28/16 1508  BP: 132/69  Pulse: 73  Temp: 98.3 F (36.8 C)  TempSrc: Oral  SpO2: 98%  Weight: 207 lb 9.6 oz (94.2 kg)  Height: 5\' 8"  (1.727 m)   Physical Exam  Constitutional: He is oriented to person, place, and time. He appears well-developed and well-nourished. No distress.  HENT:  Head: Normocephalic and atraumatic.  Eyes: Right eye exhibits no discharge. Left eye exhibits no discharge.  Cardiovascular: Normal rate, regular rhythm and intact distal pulses.   Pulmonary/Chest: Effort normal and breath sounds normal. No respiratory distress. He has no wheezes. He has no rales.  Abdominal: Soft. Bowel sounds are normal. He exhibits no distension. There is no tenderness.  Musculoskeletal: He exhibits no edema.  Neurological: He is alert and oriented to person, place, and time.  Skin: Skin is warm and dry.  Erythematous, dry, scaly areas of skin noted in the antecubital fossa bilaterally. Areas of erythema and hypopigmentation extending to the arm and forearm. Please see image.  Dry skin noted at the mandible bilaterally. No skin rash noted on the trunk.      Assessment & Plan:   See Encounters Tab for problem based charting.  Patient seen  with Dr. Heide Spark

## 2016-10-28 NOTE — Assessment & Plan Note (Addendum)
History of present illness Patient reports having 3-4 month history of a rash on his arms that itches a lot. Also states his face itches at the level of the jaw line bilaterally. States he had a rash on his face in December 2017 for which he used an anti-fungal ointment until February 2018 prescribed to him by his niece who is a doctor in his country. Reports self-medicating with over-the-counter products including Vaseline, Benadryl, and Qualizol-HC ointment but to no avail. He is also complaining of occasional itching in his neck and chest area. Denies any change in his skin care products (soaps, lotions) or laundry detergent. Denies having any fevers chills, or joint pains. States he lives with his son at home who does not have any skin problems. Patient is a native [place] and has not traveled abroad recently.  Assessment Erythematous, dry, scaly areas of skin noted in the antecubital fossa bilaterally. Areas of erythema and hypopigmentation extend to the arm and forearm. Also noted to have dry skin at the level of the mandible bilaterally. No other skin rash noted on the trunk. His current presentation is likely secondary to eczema. Contact dermatitis less likely to cause symmetric bilateral upper extremity skin changes.  Plan -Trial of triamcinolone 0.1% cream twice daily for 1 week. Instructed not to apply it to his face. -Return to the clinic in 1 week for a follow-up. If there is no improvement noted at the follow-up visit, consider referral to dermatology.

## 2016-10-30 NOTE — Progress Notes (Signed)
Internal Medicine Clinic Attending  I saw and evaluated the patient.  I personally confirmed the key portions of the history and exam documented by Dr. Rathore and I reviewed pertinent patient test results.  The assessment, diagnosis, and plan were formulated together and I agree with the documentation in the resident's note.  

## 2016-11-04 ENCOUNTER — Ambulatory Visit: Payer: BLUE CROSS/BLUE SHIELD

## 2016-11-05 ENCOUNTER — Ambulatory Visit (INDEPENDENT_AMBULATORY_CARE_PROVIDER_SITE_OTHER): Payer: BLUE CROSS/BLUE SHIELD | Admitting: Internal Medicine

## 2016-11-05 VITALS — BP 133/95 | HR 92 | Temp 98.1°F | Wt 205.2 lb

## 2016-11-05 DIAGNOSIS — I10 Essential (primary) hypertension: Secondary | ICD-10-CM

## 2016-11-05 DIAGNOSIS — I5022 Chronic systolic (congestive) heart failure: Secondary | ICD-10-CM

## 2016-11-05 DIAGNOSIS — R21 Rash and other nonspecific skin eruption: Secondary | ICD-10-CM | POA: Diagnosis not present

## 2016-11-05 MED ORDER — ENALAPRIL MALEATE 20 MG PO TABS: 20.0000 mg | ORAL_TABLET | Freq: Two times a day (BID) | ORAL | 6 refills | Status: DC

## 2016-11-05 MED ORDER — CARVEDILOL 25 MG PO TABS: 12.5000 mg | ORAL_TABLET | Freq: Two times a day (BID) | ORAL | 6 refills | Status: DC

## 2016-11-05 MED ORDER — TRIAMCINOLONE ACETONIDE 0.1 % EX CREA: TOPICAL_CREAM | CUTANEOUS | 0 refills | Status: DC

## 2016-11-05 MED ORDER — SPIRONOLACTONE 25 MG PO TABS: 25.0000 mg | ORAL_TABLET | Freq: Every day | ORAL | 6 refills | Status: DC

## 2016-11-05 NOTE — Patient Instructions (Signed)
Fue un Control and instrumentation engineer.   Continue usando la crema triamcinolone en las areas del salpullido. Recuerdo no untarsela en la cara.   Le rellene todos sus medicamentos de la presion y los envie a la farmacia de [place].   Llame a la clinica si sus sintomas empeoran.

## 2016-11-06 NOTE — Assessment & Plan Note (Signed)
Patient see on 8/6 for bilateral, erythematous, dry rash on arms that was consistent with eczema. He was prescribed triamcinolone cream which he has been compliant with. Reports significant improvement in rash and near complete resolution of itching. No erythema or dry skin on exam, but areas of hypopigmentation noted. Will continue current treatment.   - Refilled triamcinolone 0.1%. Advised patient not to use on face.  - Instructed patient to call [facility] clinic for an appt if symptoms worsen

## 2016-11-06 NOTE — Assessment & Plan Note (Signed)
Refilled coreg 1.5 bid, spironolactone 25, and enalapril 20 bid

## 2016-11-06 NOTE — Progress Notes (Signed)
   CC: Rash follow up   HPI:  [name] is a 51 y.o. male with PMH as described below who presents to clinic for rash follow up. Please see problem based assessment and plan for further details.   Past Medical History:  Diagnosis Date  . Cardiac insufficiency (HCC)   . CHF (congestive heart failure) (HCC)   . Chronic back pain    "neck down" (07/20/2013)  . Chronic systolic heart failure (HCC) 06/15/2014  . GERD (gastroesophageal reflux disease)   . High cholesterol   . Hypertension   . Renal calculus    Review of Systems:   Review of Systems  Constitutional: Negative for chills and fever.  Skin: Positive for itching and rash.    Physical Exam:  Vitals:   11/05/16 1610  BP: (!) 133/95  Pulse: 92  Temp: 98.1 F (36.7 C)  TempSrc: Oral  SpO2: 99%  Weight: 205 lb 3.2 oz (93.1 kg)   General: pleasant male, well-nourished, well-developed, in NAD  HENT: NCAT, neck supple and FROM, MMM, OP clear without exudates or erythema, nl dentition  Cardiac: regular rate and rhythm, nl S1/S2, no murmurs, rubs or gallops  Pulm: CTAB, no wheezes or crackles, no increased work of breathing   Ext: warm and well perfused, no peripheral edema  Derm: scattered hypopigmented macules and patches, no erythema or dry skin noted     Assessment & Plan:   See Encounters Tab for problem based charting.  Patient seen with Dr. Cleda Daub

## 2016-11-06 NOTE — Progress Notes (Signed)
Internal Medicine Clinic Attending  I saw and evaluated the patient.  I personally confirmed the key portions of the history and exam documented by Dr. Santos-Sanchez and I reviewed pertinent patient test results.  The assessment, diagnosis, and plan were formulated together and I agree with the documentation in the resident's note. 

## 2016-11-07 ENCOUNTER — Ambulatory Visit: Payer: BLUE CROSS/BLUE SHIELD

## 2017-05-27 ENCOUNTER — Encounter: Payer: Self-pay | Admitting: Pediatric Intensive Care

## 2017-06-05 ENCOUNTER — Encounter: Payer: Self-pay | Admitting: Pediatric Intensive Care

## 2017-06-16 ENCOUNTER — Other Ambulatory Visit: Payer: Self-pay

## 2017-06-16 ENCOUNTER — Encounter: Payer: Self-pay | Admitting: Internal Medicine

## 2017-06-16 ENCOUNTER — Ambulatory Visit (INDEPENDENT_AMBULATORY_CARE_PROVIDER_SITE_OTHER): Payer: BLUE CROSS/BLUE SHIELD | Admitting: Internal Medicine

## 2017-06-16 VITALS — BP 120/73 | HR 76 | Temp 98.6°F | Ht 68.0 in | Wt 206.9 lb

## 2017-06-16 DIAGNOSIS — I43 Cardiomyopathy in diseases classified elsewhere: Secondary | ICD-10-CM | POA: Diagnosis not present

## 2017-06-16 DIAGNOSIS — Z0000 Encounter for general adult medical examination without abnormal findings: Secondary | ICD-10-CM | POA: Diagnosis not present

## 2017-06-16 DIAGNOSIS — R1031 Right lower quadrant pain: Secondary | ICD-10-CM | POA: Diagnosis not present

## 2017-06-16 DIAGNOSIS — I110 Hypertensive heart disease with heart failure: Secondary | ICD-10-CM | POA: Diagnosis not present

## 2017-06-16 DIAGNOSIS — R1013 Epigastric pain: Secondary | ICD-10-CM | POA: Diagnosis not present

## 2017-06-16 DIAGNOSIS — Z79899 Other long term (current) drug therapy: Secondary | ICD-10-CM | POA: Diagnosis not present

## 2017-06-16 DIAGNOSIS — R109 Unspecified abdominal pain: Secondary | ICD-10-CM | POA: Insufficient documentation

## 2017-06-16 DIAGNOSIS — I5022 Chronic systolic (congestive) heart failure: Secondary | ICD-10-CM

## 2017-06-16 DIAGNOSIS — K922 Gastrointestinal hemorrhage, unspecified: Secondary | ICD-10-CM

## 2017-06-16 DIAGNOSIS — R1084 Generalized abdominal pain: Secondary | ICD-10-CM

## 2017-06-16 DIAGNOSIS — I10 Essential (primary) hypertension: Secondary | ICD-10-CM

## 2017-06-16 MED ORDER — ENALAPRIL MALEATE 20 MG PO TABS: 20.0000 mg | ORAL_TABLET | Freq: Two times a day (BID) | ORAL | 11 refills | Status: DC

## 2017-06-16 MED ORDER — SPIRONOLACTONE 25 MG PO TABS: 25.0000 mg | ORAL_TABLET | Freq: Every day | ORAL | 11 refills | Status: DC

## 2017-06-16 MED ORDER — CARVEDILOL 25 MG PO TABS: 12.5000 mg | ORAL_TABLET | Freq: Two times a day (BID) | ORAL | 11 refills | Status: DC

## 2017-06-16 NOTE — Assessment & Plan Note (Signed)
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 Appears well compensated today. Denies exertional symptoms. Has been medically optimized on coreg, enalapril, and spironolactone. EF has improved from 30-35% in 2016 to 50% in 2017. He denies lower extremity swelling and weight gain.  -- Continue current regimen

## 2017-06-16 NOTE — Patient Instructions (Addendum)
[name],  It was a pleasure to see you. I have refilled your blood pressure medicines. I will call you with the results of your blood work today. I have referred you to a gastroenterologist (stomach doctor) for your abdominal pain and for a colonoscopy.   Please follow up with me again in 1 year or sooner if you have any issues.  If you have any questions or concerns, call our clinic at [phone] or after hours call [phone] and ask for the internal medicine resident on call. Thank you!  - Dr. Antony Contras

## 2017-06-16 NOTE — Progress Notes (Signed)
   CC: HTN follow up, abdominal pain  HPI:  [name] is a 52 y.o. male with past medical history outlined below here for HTN follow up, abdominal pain. For the details of today's visit, please refer to the assessment and plan.  Past Medical History:  Diagnosis Date  . Cardiac insufficiency (HCC)   . CHF (congestive heart failure) (HCC)   . Chronic back pain    "neck down" (07/20/2013)  . Chronic systolic heart failure (HCC) 06/15/2014  . GERD (gastroesophageal reflux disease)   . High cholesterol   . Hypertension   . Renal calculus     Review of Systems  Cardiovascular: Negative for chest pain and leg swelling.  Gastrointestinal: Positive for abdominal pain.     Physical Exam:  Vitals:   06/16/17 1525  BP: 120/73  Pulse: 76  Temp: 98.6 F (37 C)  TempSrc: Oral  SpO2: 100%  Weight: 206 lb 14.4 oz (93.8 kg)  Height: 5\' 8"  (1.727 m)    Constitutional: NAD, appears comfortable Cardiovascular: RRR, no murmurs, rubs, or gallops.  Pulmonary/Chest: CTAB, no wheezes, rales, or rhonchi.  Abdominal: Soft, non tender, non distended. +BS.  Extremities: Warm and well perfused.  No edema.  Psychiatric: Normal mood and affect  Assessment & Plan:   See Encounters Tab for problem based charting.  Patient discussed with Dr. Heide Spark

## 2017-06-16 NOTE — Assessment & Plan Note (Signed)
GI referral for colonoscopy. Hep C screen.

## 2017-06-16 NOTE — Assessment & Plan Note (Addendum)
Patient has been experiencing atypical, generalized, and intermittent abdominal pain for the past few weeks. Describes the pain as epigastric radiating to bilateral sides and RLQ. Exam is benign and reassuring. He denies N/V. Reports regular, daily BMs. Denies melena and hematochezia. Reports pain is not daily but occurs every 2-3 days. Worse in the morning and improves after eating. Unclear reason for pain. He is due for a colonoscopy. Will refer to GI for this and further evaluation. Will also check CMP.  -- F/u CMP -- GI referral   ADDENDUM: CMP unremarkable. On further chart review, I see there was some concern for NSAID gastritis vs H pylori in 2016 with a drop in his hemoglobin. He was uninsured at the time and referred to [facility] for EGD. I do not see these records available. Although pain is atypical for gastritis / PUD, will start a trial of omeprazole. Sent prescription to pharmacy. F/u with GI. Called patient with the help of an interpreter, left voicemail with lab result and lab.

## 2017-06-16 NOTE — Assessment & Plan Note (Signed)
Well controlled. Continue current regimen. -- Refilled coreg 12.5 mg BID -- Refilled enalapril 20 mg BID -- Refilled spironolactone 25 mg daily

## 2017-06-17 LAB — CMP14 + ANION GAP
A/G RATIO: 1.4 (ref 1.2–2.2)
ALT: 31 IU/L (ref 0–44)
ANION GAP: 14 mmol/L (ref 10.0–18.0)
AST: 22 IU/L (ref 0–40)
Albumin: 4.4 g/dL (ref 3.5–5.5)
Alkaline Phosphatase: 60 IU/L (ref 39–117)
BUN/Creatinine Ratio: 24 — ABNORMAL HIGH (ref 9–20)
BUN: 25 mg/dL — AB (ref 6–24)
Bilirubin Total: <0.2 mg/dL (ref 0.0–1.2)
CALCIUM: 9.8 mg/dL (ref 8.7–10.2)
CO2: 22 mmol/L (ref 20–29)
CREATININE: 1.05 mg/dL (ref 0.76–1.27)
Chloride: 106 mmol/L (ref 96–106)
GFR calc Af Amer: 93 mL/min/{1.73_m2} (ref >59)
GFR, EST NON AFRICAN AMERICAN: 80 mL/min/{1.73_m2} (ref >59)
GLUCOSE: 101 mg/dL — AB (ref 65–99)
Globulin, Total: 3.2 g/dL (ref 1.5–4.5)
POTASSIUM: 4.6 mmol/L (ref 3.5–5.2)
Sodium: 142 mmol/L (ref 134–144)
Total Protein: 7.6 g/dL (ref 6.0–8.5)

## 2017-06-17 LAB — HEPATITIS C ANTIBODY: Hep C Virus Ab: <0.1 s/co ratio (ref 0.0–0.9)

## 2017-06-17 MED ORDER — OMEPRAZOLE 20 MG PO CPDR: 20.0000 mg | DELAYED_RELEASE_CAPSULE | Freq: Every day | ORAL | 2 refills | Status: DC

## 2017-06-17 NOTE — Progress Notes (Signed)
Internal Medicine Clinic Attending  Case discussed with Dr. Guilloud at the time of the visit.  We reviewed the resident's history and exam and pertinent patient test results.  I agree with the assessment, diagnosis, and plan of care documented in the resident's note.  

## 2017-06-17 NOTE — Addendum Note (Signed)
Addended by: Burnell Blanks on: 06/17/2017 01:14 PM   Modules accepted: Orders

## 2017-06-19 ENCOUNTER — Telehealth: Payer: Self-pay | Admitting: *Deleted

## 2017-06-19 NOTE — Congregational Nurse Program (Signed)
Congregational Nurse Program Note  Date of Encounter: 05/27/2017  Past Medical History: Past Medical History:  Diagnosis Date  . Cardiac insufficiency (HCC)   . CHF (congestive heart failure) (HCC)   . Chronic back pain    "neck down" (07/20/2013)  . Chronic systolic heart failure (HCC) 06/15/2014  . GERD (gastroesophageal reflux disease)   . High cholesterol   . Hypertension   . Renal calculus     Encounter Details: CNP Questionnaire - 05/27/17 1600      Questionnaire   Patient Status  Immigrant    Race  Hispanic or Latino    Location Patient Served At  [employer] [facility]    Uninsured  Not Applicable    Food  No food insecurities    Housing/Utilities  Yes, have permanent housing    Transportation  No transportation needs    Interpersonal Safety  Yes, feel physically and emotionally safe where you currently live    Medication  No medication insecurities    Medical Provider  Yes    Referrals  [facility];Other;Primary Care Provider/Clinic    ED Visit Averted  Not Applicable    Life-Saving Intervention Made  Not Applicable      Clinical Intake - 06/16/17 1716      Functional Status   Activities of Daily Living  Independent    Ambulation  Independent    Medication Administration  Independent    Home Management  Independent      Language Assistant   Interpreter Needed?  Yes    Interpreter Agency  [employer]    Interpreter Name  sylvia    Interpreter ID  [phone]    Patient Declined Interpreter   No    Patient signed [facility] waiver  No      Comments   Information entered by :  Filomena Jungling NT1     Via interpreter Cicero Duck- client in for BP check. He would like assistance with making PCP appointment and follow up with ophthalmologist.

## 2017-06-19 NOTE — Telephone Encounter (Signed)
CALLED PATIENT TO FOLLOWUP ON GI REFERRAL FROM 2017 / PATIENT DNKA.  SPOKE WITH HIS CO-WORKER ([phone]) . PATIENT STATED THAT WE CAN CONTACT HIS CO WORKER WHEN NEED TO MAKE APPOINTMENT/ HIS CO WORKER SPEAKS ENGLISH.

## 2017-06-25 ENCOUNTER — Other Ambulatory Visit: Payer: Self-pay | Admitting: Gastroenterology

## 2017-06-25 DIAGNOSIS — R1084 Generalized abdominal pain: Secondary | ICD-10-CM

## 2017-06-30 NOTE — Congregational Nurse Program (Signed)
Congregational Nurse Program Note  Date of Encounter: 06/05/2017  Past Medical History: Past Medical History:  Diagnosis Date  . Cardiac insufficiency (HCC)   . CHF (congestive heart failure) (HCC)   . Chronic back pain    "neck down" (07/20/2013)  . Chronic systolic heart failure (HCC) 06/15/2014  . GERD (gastroesophageal reflux disease)   . High cholesterol   . Hypertension   . Renal calculus     Encounter Details: CNP Questionnaire - 06/05/17 1500      Questionnaire   Patient Status  Immigrant    Race  Hispanic or Latino    Location Patient Served At  [facility]    Uninsured  Not Applicable    Food  No food insecurities    Housing/Utilities  Yes, have permanent housing    Transportation  No transportation needs    Interpersonal Safety  Yes, feel physically and emotionally safe where you currently live    Medication  No medication insecurities    Medical Provider  Yes    Referrals  [facility];Primary Care Provider/Clinic    ED Visit Averted  Not Applicable    Life-Saving Intervention Made  Not Applicable      Clinical Intake - 06/16/17 1716      Functional Status   Activities of Daily Living  Independent    Ambulation  Independent    Medication Administration  Independent    Home Management  Independent      Language Assistant   Interpreter Needed?  Yes    Interpreter Agency  [employer]    Interpreter Name  sylvia    Interpreter ID  [id number]    Patient Declined Interpreter   No    Patient signed [facility] waiver  No      Comments   Information entered by :  Filomena Jungling NT1     Via interpreter Ladona Ridgel- client needs BP check. Requests assistance with 6 month follow up at Internal Medicine clinic.

## 2017-07-04 ENCOUNTER — Other Ambulatory Visit: Payer: Self-pay

## 2017-07-07 ENCOUNTER — Other Ambulatory Visit: Payer: Self-pay

## 2017-10-01 ENCOUNTER — Observation Stay (HOSPITAL_COMMUNITY)
Admission: EM | Admit: 2017-10-01 | Discharge: 2017-10-03 | Disposition: A | Discharge status: Home / Self Care | Payer: BLUE CROSS/BLUE SHIELD | Attending: Internal Medicine | Admitting: Internal Medicine

## 2017-10-01 ENCOUNTER — Other Ambulatory Visit: Payer: Self-pay

## 2017-10-01 ENCOUNTER — Encounter (HOSPITAL_COMMUNITY): Payer: Self-pay | Admitting: Emergency Medicine

## 2017-10-01 ENCOUNTER — Emergency Department (HOSPITAL_COMMUNITY): Payer: BLUE CROSS/BLUE SHIELD

## 2017-10-01 ENCOUNTER — Observation Stay (HOSPITAL_BASED_OUTPATIENT_CLINIC_OR_DEPARTMENT_OTHER): Payer: BLUE CROSS/BLUE SHIELD

## 2017-10-01 ENCOUNTER — Observation Stay (HOSPITAL_COMMUNITY): Payer: BLUE CROSS/BLUE SHIELD

## 2017-10-01 DIAGNOSIS — E7800 Pure hypercholesterolemia, unspecified: Secondary | ICD-10-CM | POA: Insufficient documentation

## 2017-10-01 DIAGNOSIS — R509 Fever, unspecified: Secondary | ICD-10-CM | POA: Diagnosis not present

## 2017-10-01 DIAGNOSIS — I429 Cardiomyopathy, unspecified: Secondary | ICD-10-CM

## 2017-10-01 DIAGNOSIS — Z8249 Family history of ischemic heart disease and other diseases of the circulatory system: Secondary | ICD-10-CM | POA: Insufficient documentation

## 2017-10-01 DIAGNOSIS — A419 Sepsis, unspecified organism: Principal | ICD-10-CM | POA: Insufficient documentation

## 2017-10-01 DIAGNOSIS — Z79899 Other long term (current) drug therapy: Secondary | ICD-10-CM | POA: Insufficient documentation

## 2017-10-01 DIAGNOSIS — K219 Gastro-esophageal reflux disease without esophagitis: Secondary | ICD-10-CM | POA: Insufficient documentation

## 2017-10-01 DIAGNOSIS — I110 Hypertensive heart disease with heart failure: Secondary | ICD-10-CM | POA: Diagnosis not present

## 2017-10-01 DIAGNOSIS — I959 Hypotension, unspecified: Secondary | ICD-10-CM | POA: Insufficient documentation

## 2017-10-01 DIAGNOSIS — R0600 Dyspnea, unspecified: Secondary | ICD-10-CM | POA: Diagnosis not present

## 2017-10-01 DIAGNOSIS — I5022 Chronic systolic (congestive) heart failure: Secondary | ICD-10-CM | POA: Insufficient documentation

## 2017-10-01 DIAGNOSIS — R05 Cough: Secondary | ICD-10-CM | POA: Diagnosis not present

## 2017-10-01 DIAGNOSIS — R918 Other nonspecific abnormal finding of lung field: Secondary | ICD-10-CM | POA: Diagnosis not present

## 2017-10-01 DIAGNOSIS — N179 Acute kidney failure, unspecified: Secondary | ICD-10-CM | POA: Insufficient documentation

## 2017-10-01 DIAGNOSIS — R609 Edema, unspecified: Secondary | ICD-10-CM

## 2017-10-01 DIAGNOSIS — Z87442 Personal history of urinary calculi: Secondary | ICD-10-CM | POA: Insufficient documentation

## 2017-10-01 DIAGNOSIS — R059 Cough, unspecified: Secondary | ICD-10-CM

## 2017-10-01 DIAGNOSIS — E669 Obesity, unspecified: Secondary | ICD-10-CM | POA: Diagnosis not present

## 2017-10-01 DIAGNOSIS — J189 Pneumonia, unspecified organism: Secondary | ICD-10-CM

## 2017-10-01 LAB — BASIC METABOLIC PANEL
ANION GAP: 6 (ref 5–15)
BUN: 20 mg/dL (ref 6–20)
CO2: 24 mmol/L (ref 22–32)
CREATININE: 1.1 mg/dL (ref 0.61–1.24)
Calcium: 8.2 mg/dL — ABNORMAL LOW (ref 8.9–10.3)
Chloride: 108 mmol/L (ref 98–111)
GFR calc Af Amer: >60 mL/min (ref >60)
GFR calc non Af Amer: >60 mL/min (ref >60)
Glucose, Bld: 91 mg/dL (ref 70–99)
Potassium: 4 mmol/L (ref 3.5–5.1)
SODIUM: 138 mmol/L (ref 135–145)

## 2017-10-01 LAB — COMPREHENSIVE METABOLIC PANEL
ALT: 35 U/L (ref 0–44)
ANION GAP: 12 (ref 5–15)
AST: 27 U/L (ref 15–41)
Albumin: 3.9 g/dL (ref 3.5–5.0)
Alkaline Phosphatase: 50 U/L (ref 38–126)
BUN: 24 mg/dL — ABNORMAL HIGH (ref 6–20)
CHLORIDE: 107 mmol/L (ref 98–111)
CO2: 20 mmol/L — ABNORMAL LOW (ref 22–32)
CREATININE: 1.36 mg/dL — AB (ref 0.61–1.24)
Calcium: 9.3 mg/dL (ref 8.9–10.3)
GFR, EST NON AFRICAN AMERICAN: 58 mL/min — AB (ref >60)
Glucose, Bld: 119 mg/dL — ABNORMAL HIGH (ref 70–99)
POTASSIUM: 4 mmol/L (ref 3.5–5.1)
Sodium: 139 mmol/L (ref 135–145)
Total Bilirubin: 0.8 mg/dL (ref 0.3–1.2)
Total Protein: 8 g/dL (ref 6.5–8.1)

## 2017-10-01 LAB — CBC WITH DIFFERENTIAL/PLATELET
Abs Immature Granulocytes: 0.1 10*3/uL (ref 0.0–0.1)
BASOS ABS: 0.1 10*3/uL (ref 0.0–0.1)
Basophils Relative: 0 %
EOS PCT: 1 %
Eosinophils Absolute: 0.1 10*3/uL (ref 0.0–0.7)
HCT: 43.3 % (ref 39.0–52.0)
HEMOGLOBIN: 14.2 g/dL (ref 13.0–17.0)
Immature Granulocytes: 1 %
LYMPHS PCT: 4 %
Lymphs Abs: 0.7 10*3/uL (ref 0.7–4.0)
MCH: 28.7 pg (ref 26.0–34.0)
MCHC: 32.8 g/dL (ref 30.0–36.0)
MCV: 87.5 fL (ref 78.0–100.0)
Monocytes Absolute: 0.6 10*3/uL (ref 0.1–1.0)
Monocytes Relative: 4 %
Neutro Abs: 14.7 10*3/uL — ABNORMAL HIGH (ref 1.7–7.7)
Neutrophils Relative %: 90 %
Platelets: 275 10*3/uL (ref 150–400)
RBC: 4.95 MIL/uL (ref 4.22–5.81)
RDW: 14.2 % (ref 11.5–15.5)
WBC: 16.2 10*3/uL — AB (ref 4.0–10.5)

## 2017-10-01 LAB — URINALYSIS, ROUTINE W REFLEX MICROSCOPIC
BILIRUBIN URINE: NEGATIVE
GLUCOSE, UA: NEGATIVE mg/dL
Ketones, ur: NEGATIVE mg/dL
LEUKOCYTES UA: NEGATIVE
NITRITE: NEGATIVE
PH: 5 (ref 5.0–8.0)
Protein, ur: 30 mg/dL — AB
SPECIFIC GRAVITY, URINE: 1.024 (ref 1.005–1.030)

## 2017-10-01 LAB — RESPIRATORY PANEL BY PCR
Adenovirus: NOT DETECTED
Bordetella pertussis: NOT DETECTED
CORONAVIRUS 229E-RVPPCR: NOT DETECTED
Chlamydophila pneumoniae: NOT DETECTED
Coronavirus HKU1: NOT DETECTED
Coronavirus NL63: NOT DETECTED
Coronavirus OC43: NOT DETECTED
INFLUENZA B-RVPPCR: NOT DETECTED
Influenza A: NOT DETECTED
MYCOPLASMA PNEUMONIAE-RVPPCR: NOT DETECTED
Metapneumovirus: NOT DETECTED
PARAINFLUENZA VIRUS 1-RVPPCR: NOT DETECTED
Parainfluenza Virus 2: NOT DETECTED
Parainfluenza Virus 3: NOT DETECTED
Parainfluenza Virus 4: NOT DETECTED
Respiratory Syncytial Virus: NOT DETECTED
Rhinovirus / Enterovirus: NOT DETECTED

## 2017-10-01 LAB — I-STAT CG4 LACTIC ACID, ED: LACTIC ACID, VENOUS: 1.25 mmol/L (ref 0.5–1.9)

## 2017-10-01 LAB — LACTIC ACID, PLASMA: LACTIC ACID, VENOUS: 0.7 mmol/L (ref 0.5–1.9)

## 2017-10-01 LAB — TROPONIN I

## 2017-10-01 LAB — PROTIME-INR
INR: 1.08
PROTHROMBIN TIME: 13.9 s (ref 11.4–15.2)

## 2017-10-01 LAB — HIV ANTIBODY (ROUTINE TESTING W REFLEX): HIV Screen 4th Generation wRfx: NONREACTIVE

## 2017-10-01 LAB — GROUP A STREP BY PCR: Group A Strep by PCR: NOT DETECTED

## 2017-10-01 LAB — PROCALCITONIN: Procalcitonin: 0.28 ng/mL

## 2017-10-01 LAB — BRAIN NATRIURETIC PEPTIDE: B Natriuretic Peptide: 70.4 pg/mL (ref 0.0–100.0)

## 2017-10-01 MED ORDER — SODIUM CHLORIDE 0.9 % IV BOLUS (SEPSIS)
1000.0000 mL | Freq: Once | INTRAVENOUS | Status: AC
  Administered 2017-10-01: 1000 mL via INTRAVENOUS

## 2017-10-01 MED ORDER — ACETAMINOPHEN 325 MG PO TABS
650.0000 mg | ORAL_TABLET | Freq: Once | ORAL | Status: AC | PRN
  Administered 2017-10-01: 650 mg via ORAL
  Filled 2017-10-01: qty 2

## 2017-10-01 MED ORDER — PANTOPRAZOLE SODIUM 40 MG PO TBEC
40.0000 mg | DELAYED_RELEASE_TABLET | Freq: Every day | ORAL | Status: DC
  Administered 2017-10-01 – 2017-10-03 (×3): 40 mg via ORAL
  Filled 2017-10-01 (×3): qty 1

## 2017-10-01 MED ORDER — SODIUM CHLORIDE 0.9 % IV SOLN
500.0000 mg | INTRAVENOUS | Status: DC
  Administered 2017-10-01: 500 mg via INTRAVENOUS
  Filled 2017-10-01: qty 500

## 2017-10-01 MED ORDER — ENOXAPARIN SODIUM 40 MG/0.4ML ~~LOC~~ SOLN
40.0000 mg | SUBCUTANEOUS | Status: DC
  Administered 2017-10-01 – 2017-10-03 (×3): 40 mg via SUBCUTANEOUS
  Filled 2017-10-01 (×3): qty 0.4

## 2017-10-01 MED ORDER — BENZONATATE 100 MG PO CAPS
100.0000 mg | ORAL_CAPSULE | Freq: Three times a day (TID) | ORAL | Status: DC | PRN
  Administered 2017-10-01: 100 mg via ORAL
  Filled 2017-10-01: qty 1

## 2017-10-01 MED ORDER — SODIUM CHLORIDE 0.9 % IV SOLN
INTRAVENOUS | Status: DC
  Administered 2017-10-01: 06:00:00 via INTRAVENOUS

## 2017-10-01 MED ORDER — SODIUM CHLORIDE 0.9 % IV SOLN
2.0000 g | INTRAVENOUS | Status: DC
  Administered 2017-10-01 – 2017-10-02 (×2): 2 g via INTRAVENOUS
  Filled 2017-10-01 (×2): qty 20

## 2017-10-01 MED ORDER — AZITHROMYCIN 500 MG IV SOLR
250.0000 mg | INTRAVENOUS | Status: DC
  Administered 2017-10-02: 250 mg via INTRAVENOUS
  Filled 2017-10-01 (×2): qty 250

## 2017-10-01 NOTE — Progress Notes (Signed)
Subjective: Hospital day 1  Overnight: No acute events with the exception of his presenting symptoms of hypotension and febrile to 101.7  Today, patient was evaluated at bedside and reports that his shortness of breath has significantly improved.  He denies any recurrent fevers.  He does however endorse pleuritic chest pain, rating pain a 7 out of 10, headache which started yesterday but has improved and chills.  On further questioning he reports that he experienced sudden onset shortness of breath, fevers, chills and cough yesterday around 3 PM and was subsequently brought to the ED by his son around midnight.  He recently returned from [place] but however denies any history of diarrhea, fever, chills during the trip. He also denies myalgia but however complains of right lower extremity pain without any evidence of swelling.  Objective:  Vital signs in last 24 hours: Vitals:   10/01/17 0450 10/01/17 0459 10/01/17 1232 10/01/17 1545  BP:  101/62 133/75 (!) 111/56  Pulse:  68 70 79  Resp:  18 18 16   Temp:  98.2 F (36.8 C) 98.2 F (36.8 C) (!) 100.5 F (38.1 C)  TempSrc:  Oral Oral Oral  SpO2:  99% 98% 93%  Weight:      Height: 5' 9.69" (1.77 m)      Physical exams: Constitutional: In no acute distress, lying in bed HEENT: Atraumatic, normocephalic Eyes: Nonicteric, no pallor Cardiovascular: RRR, no murmurs, gallops or rubs Respiratory: CTA BL, no wheezes, crackles or rhonchi Abdomen: Soft, non-distended, nontender to palpation Extremities: Right and left cough equal in size, nontender to palpation, nonswollen  Assessment/Plan:  Active Problems:   Pneumonia   Sepsis (HCC)  Phillip [name] is a 52 year old Hispanic male with a past medical history significant for systolic heart failure diagnosed in April 2004 EF 50% (2017), hypertension, hyperlipidemia, obesity and major depressive disorder who presented with sudden onset fever, cough and shortness of breath.  Sepsis  secondary to community-acquired pneumonia Patient had a sudden onset dyspnea, nonproductive cough, headache, chills and fever prior to presenting to the ED on 10/01/2017.  In the ED he was febrile to 101.7, leukocytosis of 16.2 and hypotensive with recorded BP 90s/50s.  Initial chest x-ray in the ED revealed heterogeneous bibasilar opacities.  Initial lab results reviewed procalcitonin of 0.28 and lactic acid of 0.7. Due to concern of sepsis secondary to CAP, started on ceftriaxone 2 g and azithromycin 500 mg.  -Status post 3L liters NS bolus with maintenance which has been discontinued -On azithromycin 250 mg IV -On ceftriaxone 2 g IV -Borderline procalcitonin 0.28 -Lactic acid 0.7 -HIV nonreactive -Leukocytosis of 16.2.  We will continue to follow -Blood cultures pending -Negative group a strep PCR -UA negative   Pulmonary embolism, unlikely -Patient recently travelled to [place] and reports pain in his right lower extremity in the setting of sudden onset shortness of breath and general pain however physical exams did not show any swelling -His Geneva score was 3 and Wells criteria score was 5 putting him at a moderate risk for PE -Lower extremity Doppler was negative for DVT -Normal EKG   Systolic heart failure secondary to hypertensive cardiomyopathy Most recent echo done on June/20/2017 showed ejection fraction 50%.  Repeat chest x-ray showed progressive interstitial thickening throughout the lung suspicious of interstitial edema.  I was concerned for CHF exacerbation and ordered BNP, troponin, echo.   -BNP 70.4 -Troponin negative -Echo pending -Status post 4 L IV fluid which has been discontinued -If patient becomes hypoxic worsening as of  breath, consider diuresis -Patient is currently taking carvedilol 12.5 mg twice daily and enalapril 20 mg twice daily -Per chart review he had a [facility] cath [facility] in 2014.     Acute kidney injury, improved -Creatinine of 1.36 and BUN of 24  on admission from baseline of 1.05 -Currently creatinine is 1.10 -Status post 3L liters NS bolus with maintenance which has been discontinued   Hypertension -Hypertensive to 90s/50s on admission in the setting of sepsis -Status post 3 L normal saline with maintenance -Blood pressure stable -We will hold home antihypertensives carvedilol 12.5 mg twice daily and enalapril 20 mg twice daily   FEN:  Replace electrolytes as needed, heart healthy diet VTE ppx: Lovenox  Code Status: FULL     Yvette Rack, MD 10/01/2017, 5:21 PM Pager: [phone]

## 2017-10-01 NOTE — ED Provider Notes (Signed)
[facility] EMERGENCY DEPARTMENT Provider Note   CSN: [id number] Arrival date & time: 10/01/17  0053     History   Chief Complaint Chief Complaint  Patient presents with  . Cough  . URI    HPI Phillip [name] is a 52 y.o. male.  Patient presents to the emergency department for evaluation of fever and cough.  Symptoms began this afternoon.  Patient reports cough is slightly productive, he feels short of breath.  He has been experiencing some nasal drainage and feels chills and sweats as well as generalized body aches.  He feels weak and dizzy.  His son recently was treated for strep.  He traveled to [place] in May, but no recent travel.     Past Medical History:  Diagnosis Date  . Cardiac insufficiency (HCC)   . CHF (congestive heart failure) (HCC)   . Chronic back pain    "neck down" (07/20/2013)  . Chronic systolic heart failure (HCC) 06/15/2014  . GERD (gastroesophageal reflux disease)   . High cholesterol   . Hypertension   . Renal calculus     Patient Active Problem List   Diagnosis Date Noted  . Abdominal pain 06/16/2017  . Skin rash 10/28/2016  . History of corneal transplant 07/08/2016  . MDD (major depressive disorder) 12/25/2015  . Upper GI bleed 07/24/2015  . Plantar fasciitis 06/15/2015  . Obesity 04/24/2015  . Erectile dysfunction 03/02/2015  . Healthcare maintenance 12/29/2014  . Chronic systolic heart failure (HCC) 06/15/2014  . Hyperlipidemia 05/03/2014  . HTN (hypertension) 11/15/2012  . Chest pain 11/15/2012    Past Surgical History:  Procedure Laterality Date  . CARDIAC CATHETERIZATION  2014?  [name] CATARACT EXTRACTION W/ INTRAOCULAR LENS IMPLANT Right 02/16/2014  . CORNEAL TRANSPLANT Right ~ 2010  . EYE SURGERY Right   . LEFT HEART CATHETERIZATION WITH CORONARY ANGIOGRAM N/A 05/26/2014   Procedure: LEFT HEART CATHETERIZATION WITH CORONARY ANGIOGRAM;  Surgeon: Laurey Morale, MD;  Location: [facility] CATH LAB;  Service:  Cardiovascular;  Laterality: N/A;        Home Medications    Prior to Admission medications   Medication Sig Start Date End Date Taking? Authorizing Provider  carvedilol (COREG) 25 MG tablet Take 0.5 tablets (12.5 mg total) by mouth 2 (two) times daily with a meal. HF program 06/16/17   Reymundo Poll, MD  enalapril (VASOTEC) 20 MG tablet Take 1 tablet (20 mg total) by mouth 2 (two) times daily. HF program 06/16/17   Reymundo Poll, MD  omeprazole (PRILOSEC) 20 MG capsule Take 1 capsule (20 mg total) by mouth daily. For abdominal pain 06/17/17   Reymundo Poll, MD  spironolactone (ALDACTONE) 25 MG tablet Take 1 tablet (25 mg total) by mouth daily. HF program 06/16/17   Reymundo Poll, MD  triamcinolone cream (KENALOG) 0.1 % Apply to affected areas on arms twice daily. 11/05/16   Burna Cash, MD    Family History No family history on file.  Social History Social History   Tobacco Use  . Smoking status: Never Smoker  . Smokeless tobacco: Never Used  Substance Use Topics  . Alcohol use: No    Alcohol/week: 0.0 oz  . Drug use: No     Allergies   Patient has no known allergies.   Review of Systems Review of Systems  Constitutional: Positive for chills, fatigue and fever.  Respiratory: Positive for cough and shortness of breath.   Musculoskeletal: Positive for myalgias.  All other systems reviewed and are negative.  Physical Exam Updated Vital Signs BP 116/66 (BP Location: Right Arm)   Pulse 97   Temp (!) 101.7 F (38.7 C) (Oral)   Resp 18   Ht 5\' 9"  (1.753 m)   Wt 95.3 kg (210 lb)   SpO2 98%   BMI 31.01 kg/m   Physical Exam  Constitutional: He is oriented to person, place, and time. He appears well-developed and well-nourished. No distress.  HENT:  Head: Normocephalic and atraumatic.  Right Ear: Hearing normal.  Left Ear: Hearing normal.  Nose: Nose normal.  Mouth/Throat: Oropharynx is clear and moist and mucous membranes are normal.    Eyes: Pupils are equal, round, and reactive to light. Conjunctivae and EOM are normal.  Neck: Normal range of motion. Neck supple.  Cardiovascular: Regular rhythm, S1 normal and S2 normal. Exam reveals no gallop and no friction rub.  No murmur heard. Pulmonary/Chest: Effort normal and breath sounds normal. No respiratory distress. He exhibits no tenderness.  Abdominal: Soft. Normal appearance and bowel sounds are normal. There is no hepatosplenomegaly. There is no tenderness. There is no rebound, no guarding, no tenderness at McBurney's point and negative Murphy's sign. No hernia.  Musculoskeletal: Normal range of motion.  Neurological: He is alert and oriented to person, place, and time. He has normal strength. No cranial nerve deficit or sensory deficit. Coordination normal. GCS eye subscore is 4. GCS verbal subscore is 5. GCS motor subscore is 6.  Skin: Skin is warm, dry and intact. No rash noted. No cyanosis.  Psychiatric: He has a normal mood and affect. His speech is normal and behavior is normal. Thought content normal.  Nursing note and vitals reviewed.    ED Treatments / Results  Labs (all labs ordered are listed, but only abnormal results are displayed) Labs Reviewed  COMPREHENSIVE METABOLIC PANEL - Abnormal; Notable for the following components:      Result Value   CO2 20 (*)    Glucose, Bld 119 (*)    BUN 24 (*)    Creatinine, Ser 1.36 (*)    GFR calc non Af Amer 58 (*)    All other components within normal limits  CBC WITH DIFFERENTIAL/PLATELET - Abnormal; Notable for the following components:   WBC 16.2 (*)    Neutro Abs 14.7 (*)    All other components within normal limits  URINALYSIS, ROUTINE W REFLEX MICROSCOPIC - Abnormal; Notable for the following components:   APPearance HAZY (*)    Hgb urine dipstick SMALL (*)    Protein, ur 30 (*)    Bacteria, UA RARE (*)    All other components within normal limits  GROUP A STREP BY PCR  CULTURE, BLOOD (ROUTINE X 2)   CULTURE, BLOOD (ROUTINE X 2)  PROTIME-INR  I-STAT CG4 LACTIC ACID, ED  I-STAT CG4 LACTIC ACID, ED    EKG None  Radiology Dg Chest 2 View  Result Date: 10/01/2017 CLINICAL DATA:  Cough and fever since yesterday. EXAM: CHEST - 2 VIEW COMPARISON:  Radiographs 07/21/2015 FINDINGS: The cardiomediastinal contours are normal. Heterogeneous bibasilar opacities, slightly more prominent on the left. Pulmonary vasculature is normal. No confluent consolidation, pleural effusion, or pneumothorax. No acute osseous abnormalities are seen. IMPRESSION: Heterogeneous bibasilar opacities, left greater than right, favoring atelectasis, however pneumonia is considered in the setting of cough and fever. Electronically Signed   By: Rubye Oaks M.D.   On: 10/01/2017 01:55    Procedures Procedures (including critical care time)  Medications Ordered in ED Medications  sodium chloride  0.9 % bolus 1,000 mL (has no administration in time range)    And  sodium chloride 0.9 % bolus 1,000 mL (has no administration in time range)    And  sodium chloride 0.9 % bolus 1,000 mL (has no administration in time range)  cefTRIAXone (ROCEPHIN) 2 g in sodium chloride 0.9 % 100 mL IVPB (has no administration in time range)  azithromycin (ZITHROMAX) 500 mg in sodium chloride 0.9 % 250 mL IVPB (has no administration in time range)  acetaminophen (TYLENOL) tablet 650 mg (650 mg Oral Given 10/01/17 0127)     Initial Impression / Assessment and Plan / ED Course  I have reviewed the triage vital signs and the nursing notes.  Pertinent labs & imaging results that were available during my care of the patient were reviewed by me and considered in my medical decision making (see chart for details).     Patient presents to the emergency department for evaluation of fever, chills, cough as well as generalized malaise and myalgias.  He was febrile at arrival.  Patient found to have a leukocytosis, mild tachycardia.  He initially  had normal blood pressure, but he has become hypotensive here in the ER.  Lactic acid is not significantly elevated.  He does meet criteria for sepsis, initiated on IV Rocephin and Zithromax for community-acquired pneumonia seen on chest x-ray.  CRITICAL CARE Performed by: Gilda Crease   Total critical care time: 30 minutes  Critical care time was exclusive of separately billable procedures and treating other patients.  Critical care was necessary to treat or prevent imminent or life-threatening deterioration.  Critical care was time spent personally by me on the following activities: development of treatment plan with patient and/or surrogate as well as nursing, discussions with consultants, evaluation of patient's response to treatment, examination of patient, obtaining history from patient or surrogate, ordering and performing treatments and interventions, ordering and review of laboratory studies, ordering and review of radiographic studies, pulse oximetry and re-evaluation of patient's condition.   Final Clinical Impressions(s) / ED Diagnoses   Final diagnoses:  Community acquired pneumonia, unspecified laterality  Sepsis, due to unspecified organism [facility])    ED Discharge Orders    None       Lonnie Reth, Canary Brim, MD 10/01/17 [phone]

## 2017-10-01 NOTE — H&P (Signed)
Date: 10/01/2017               Patient Name:  Phillip [name] MRN: [id number]  DOB: Dec 29, 1965 Age / Sex: 52 y.o., male   PCP: Reymundo Poll, MD         Medical Service: Internal Medicine Teaching Service         Attending Physician: Dr. Burns Spain, MD    First Contact: Dr. Dortha Schwalbe Pager: [phone]  Second Contact: Dr. Samuella Cota Pager: [phone]       After Hours (After 5p/  First Contact Pager: [phone]  weekends / holidays): Second Contact Pager: [phone]   Chief Complaint: Fever, cough, SOB  History of Present Illness:  This is a 52 year old spanish speaking male with PMH of HTN, CHF, HLD, obesity, and MDD presenting with fever, cough, and SOB. He was accompanied by his son who assisted with translation. This started at 3PM this afternoon. He complains of generalized pain, chills, fever of 100.6, dry cough, headache and SOB. He also complains of generalized fatigue for the past month. He also describes a syncopal like episode where he was became dizzy and had blurry vision and he went to lay down on his bed and passed out / fell asleep for 5 hours. He took some tylenol for the pain. This is the first time that something like this has happened to him. He has had recent travel to [place] in May 2019. He denied any recent hospitalization. His son was recently sick with strep throat.   In the ED he was febrile to 101.7, hypotensive to 91/49, HR of 97, and RR of 18, O2 sat of 98% on RA. Labs showed AKI, leukocytosis, and a LA of 1.25. Group A strep PCR negative. He had a clean U/A. Blood cultures taken. CXR showed bibasilar opacitites possibly suggestive of pneumonia. He was started empirically on ceftriaxone and azithromycin. Given NS bolus and tylenol. Admitted to internal medicine.    Meds:  Scheduled Meds: . enoxaparin (LOVENOX) injection  40 mg Subcutaneous Q24H  . pantoprazole  40 mg Oral Daily   Continuous Infusions: . sodium chloride    . [START ON 10/02/2017]  azithromycin    . cefTRIAXone (ROCEPHIN)  IV Stopped (10/01/17 0345)   PRN Meds:.  Current Meds  Medication Sig  . carvedilol (COREG) 25 MG tablet Take 0.5 tablets (12.5 mg total) by mouth 2 (two) times daily with a meal. HF program  . enalapril (VASOTEC) 20 MG tablet Take 1 tablet (20 mg total) by mouth 2 (two) times daily. HF program  . spironolactone (ALDACTONE) 25 MG tablet Take 1 tablet (25 mg total) by mouth daily. HF program  . triamcinolone cream (KENALOG) 0.1 % Apply to affected areas on arms twice daily. (Patient taking differently: Apply 1 application topically 2 (two) times daily as needed (rash). )     Allergies: Allergies as of 10/01/2017  . (No Known Allergies)   Past Medical History:  Diagnosis Date  . Cardiac insufficiency (HCC)   . CHF (congestive heart failure) (HCC)   . Chronic back pain    "neck down" (07/20/2013)  . Chronic systolic heart failure (HCC) 06/15/2014  . GERD (gastroesophageal reflux disease)   . High cholesterol   . Hypertension   . Renal calculus     Family History: Family history of HTN.   Social History: Denies smoking, EtOH, and drug use. Lives at home with son.   Review of Systems: A complete ROS was  negative except as per HPI.   Physical Exam: Blood pressure 101/62, pulse 68, temperature 98.2 F (36.8 C), temperature source Oral, resp. rate 18, height 5' 9.69" (1.77 m), weight 210 lb (95.3 kg), SpO2 99 %. Physical Exam  Constitutional: He is oriented to person, place, and time.  Ill appearing, well developed, non-acute distress.   HENT:  Head: Normocephalic and atraumatic.  Eyes: Pupils are equal, round, and reactive to light. EOM and lids are normal.  Neck: Trachea normal and normal range of motion.  Cardiovascular: Normal rate, regular rhythm, normal heart sounds and normal pulses.  Pulmonary/Chest: He has decreased breath sounds. He has no wheezes. He has no rhonchi.  Increased respiratory effort  Abdominal: Soft. Normal  appearance and bowel sounds are normal. There is no tenderness.  Musculoskeletal: Normal range of motion.  Neurological: He is alert and oriented to person, place, and time.  Moves all extremities.  Skin: Skin is warm and dry.  Psychiatric: Mood and affect normal.    CXR: personally reviewed my interpretation is bibasilar opacities   Assessment & Plan by Problem:  This is a 52 year old spanish speaking male with PMH of HTN, CHF, HLD, obesity, and MDD presenting with fever, cough, and SOB. Admitted for possible pneumonia.  Sepsis with presumed pneumonia: The patient is presenting with fever, dry cough, fatigue, SOB, and generalized pain. Found to be febrile, hypotensive, and tachypneic in the ED. His labs are notable for a WBC count of 16 and a normal lactic acid. His CXR showed questionable bibasilar pneumonia. He meets the criteria for sepsis. He did not have any recent hospitalizations. But he reported recent travel in May to [place]. He son was recently sick with strep throat. His group A strep came back negative. His signs and symptoms are consistent with pneumonia which could be bacterial vs viral. He was started empirically on ceftriaxone and azithromycin and given 3 NS bolus and was still hypotensive when we evaluated him.  - S/p 3L NS bolus - Continue maintenance fluids  - Continue empiric ceftriaxone and azithromycin - Repeat CXR after IV resuscitation - Was given 1 dose tylenol  - Trend procalcitonin  - F/u on blood cultures - Daily CBC - Trend fever  AKI: On admission BUN 24 and Cr 1.36. Patient is s/p 3L NS and on maintenance fluids.  - Repeat BMP in AM  HTN: Patient was hypotensive on admission in setting of sepsis. 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 On carvedilol, enalapril, and spironolactone at home.  - Hold home medications.   FEN: 150cc/hr x10, replete lytes prn,  Heart healthy diet VTE ppx: Lovenox  Code Status: FULL    Dispo: Admit patient to Observation with expected length of stay less than 2 midnights.  Signed: Claudean Severance, MD 10/01/2017, 5:13 AM  Pager: [phone]

## 2017-10-01 NOTE — ED Triage Notes (Signed)
Pt reports non-productive cough that started at 3pm today accompanied by chest pain when coughing, dizziness, generalized body aches, nasal drainage, chills, sore throat and fever of 101F. Pt took Tylenol and fever went down to 100.6. Pt reports 6/10 pain. Pt's son recently had strep throat.

## 2017-10-01 NOTE — Progress Notes (Signed)
Pts son in the room, pt speaks only spanish. Pts son asked if his father was going to be started back on home meds: carvedilol, enalapril, and spironolactone. RN paged Dr. Maury Dus response.  Blane Ohara RN

## 2017-10-01 NOTE — Progress Notes (Signed)
LE venous duplex prelim: negative for DVT. Jayelle Page Eunice, RDMS, RVT  

## 2017-10-01 NOTE — ED Notes (Signed)
Patient transported to X-ray 

## 2017-10-02 ENCOUNTER — Observation Stay (HOSPITAL_BASED_OUTPATIENT_CLINIC_OR_DEPARTMENT_OTHER): Payer: BLUE CROSS/BLUE SHIELD

## 2017-10-02 DIAGNOSIS — A419 Sepsis, unspecified organism: Secondary | ICD-10-CM | POA: Diagnosis not present

## 2017-10-02 DIAGNOSIS — I5022 Chronic systolic (congestive) heart failure: Secondary | ICD-10-CM | POA: Diagnosis not present

## 2017-10-02 DIAGNOSIS — I509 Heart failure, unspecified: Secondary | ICD-10-CM

## 2017-10-02 DIAGNOSIS — I429 Cardiomyopathy, unspecified: Secondary | ICD-10-CM | POA: Diagnosis not present

## 2017-10-02 DIAGNOSIS — R05 Cough: Secondary | ICD-10-CM

## 2017-10-02 DIAGNOSIS — I110 Hypertensive heart disease with heart failure: Secondary | ICD-10-CM | POA: Diagnosis not present

## 2017-10-02 LAB — BASIC METABOLIC PANEL
Anion gap: 9 (ref 5–15)
BUN: 17 mg/dL (ref 6–20)
CO2: 25 mmol/L (ref 22–32)
Calcium: 8.4 mg/dL — ABNORMAL LOW (ref 8.9–10.3)
Chloride: 103 mmol/L (ref 98–111)
Creatinine, Ser: 1.16 mg/dL (ref 0.61–1.24)
GFR calc Af Amer: >60 mL/min (ref >60)
GFR calc non Af Amer: >60 mL/min (ref >60)
GLUCOSE: 91 mg/dL (ref 70–99)
POTASSIUM: 3.9 mmol/L (ref 3.5–5.1)
Sodium: 137 mmol/L (ref 135–145)

## 2017-10-02 LAB — CBC
HCT: 38.1 % — ABNORMAL LOW (ref 39.0–52.0)
Hemoglobin: 12.4 g/dL — ABNORMAL LOW (ref 13.0–17.0)
MCH: 28.8 pg (ref 26.0–34.0)
MCHC: 32.5 g/dL (ref 30.0–36.0)
MCV: 88.4 fL (ref 78.0–100.0)
Platelets: 217 10*3/uL (ref 150–400)
RBC: 4.31 MIL/uL (ref 4.22–5.81)
RDW: 14.2 % (ref 11.5–15.5)
WBC: 6.7 10*3/uL (ref 4.0–10.5)

## 2017-10-02 LAB — ECHOCARDIOGRAM COMPLETE
Height: 69.685 in
Weight: 3360 oz

## 2017-10-02 MED ORDER — AZITHROMYCIN 250 MG PO TABS
250.0000 mg | ORAL_TABLET | Freq: Every day | ORAL | Status: DC
  Administered 2017-10-03: 250 mg via ORAL
  Filled 2017-10-02: qty 1

## 2017-10-02 MED ORDER — FUROSEMIDE 10 MG/ML IJ SOLN
20.0000 mg | Freq: Once | INTRAMUSCULAR | Status: AC
  Administered 2017-10-02: 20 mg via INTRAVENOUS
  Filled 2017-10-02: qty 2

## 2017-10-02 NOTE — Progress Notes (Signed)
Subjective: Hospital day 2  Overnight: Complaint of cough and received Tessalon 100 mg.  Today [name], was examined at bedside and he continues to endorse a dry persistent cough not improved with cough suppressant.  He also reports of pleuritic chest pain, rates pain a 5/10 previously 7/10.  He reports of mid abdominal pain that worsens with cough.  He has a mild headache that is stable.  He denies shortness of breath, palpitation, nausea, vomiting, constipation, lower extremity edema, fever but reports of intermittently feeling cold.  Objective:  Vital signs in last 24 hours: Vitals:   10/01/17 0459 10/01/17 1232 10/01/17 1545 10/01/17 2313  BP: 101/62 133/75 (!) 111/56 121/72  Pulse: 68 70 79 70  Resp: 18 18 16 16   Temp: 98.2 F (36.8 C) 98.2 F (36.8 C) (!) 100.5 F (38.1 C) 98.4 F (36.9 C)  TempSrc: Oral Oral Oral Oral  SpO2: 99% 98% 93% 95%  Weight:      Height:       Physical Exams: Const: In no acute distress, very pleasant man, lying in bed HEENT: Atraumatic, normocephalic Eyes: Nonicteric CV: RRR, no murmurs, gallops, rubs Resp: Crackles appreciated in bilateral lower lobes posteriorly.  This seems to be a new finding Abd: Soft, nondistended, nontender to palpation Ext: No swelling noted.    Echo on 10/01/17 Pending report  Blood culture x2 Report pending   Assessment/Plan:  Active Problems:   Pneumonia   Sepsis (HCC)  Phillip [name] is a 52 year old Hispanic male with a past medical history significant for systolic heart failure diagnosed in April 2004 EF 50% (2017), hypertension, hyperlipidemia, obesity and major depressive disorder who presented with sudden onset fever, cough and shortness of breath.  Sepsis secondary to community-acquired pneumonia Patient had a sudden onset dyspnea, nonproductive cough, headache, chills and fever prior to presenting to the ED on 10/01/2017.  In the ED he was febrile to 101.7, leukocytosis of 16.2 and  hypotensive with recorded BP 90s/50s.  Initial chest x-ray in the ED revealed heterogeneous bibasilar opacities.  Initial lab results reviewed procalcitonin of 0.28 and lactic acid of 0.7. Due to concern of sepsis secondary to CAP, started on ceftriaxone 2 g and azithromycin 500 mg.  -Status post 3L liters NS bolus with maintenance which has been discontinued -Borderline procalcitonin 0.28 -Lactic acid 0.7 -HIV nonreactive -Leukocytosis resolved -Azithromycin  -----s/p 500mg  IV x1 day -----s/p 250 mg IV x1day -----> transition to p.o. azithromycin 250mg  daily (Day 0/3) end date 10/05/17 -On ceftriaxone 2 g IV --> discontinued on 10/02/17.  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  Chest x-ray showed progressive interstitial thickening throughout the lung suspicious of interstitial edema.  I was concerned for CHF exacerbation and ordered BNP, troponin, echo.   -BNP 70.4 -Troponin negative -Echo results pending  -Status post 4 L IV fluid which has been discontinued -Bilateral posterior bibasilar crackles on physical exams today. Will give a one-time dose of IV Lasix  20 mg -Patient is currently taking carvedilol 12.5 mg twice daily and enalapril 20 mg twice daily at home -Per chart review he had a normal cath at [facility] in 2014.    Acute kidney injury, improved -Creatinine of 1.36 and BUN of 24 on admission from baseline of  1.05 -Currently creatinine has normalized at 1.16 -Status post 3L liters NS bolus with maintenance which has been discontinued   Hypertension -Hypotensive to 90s/50s on admission in the setting of sepsis -Status post 3 L normal saline with maintenance -Blood pressure currently stable -We will hold home antihypertensives (carvedilol 12.5 mg twice daily and enalapril 20 mg twice daily)    FEN: Replace electrolytes as needed, heart healthy diet VTE ppx: Lovenox  Code Status: FULL  Dispo: Anticipated discharge in approximately 1 day.   Phillip Rack, MD 10/02/2017, 6:58 AM Pager: [phone]

## 2017-10-02 NOTE — Progress Notes (Signed)
  Echocardiogram 2D Echocardiogram has been performed.  Phillip [name] 10/02/2017, 2:37 PM

## 2017-10-02 NOTE — Progress Notes (Signed)
  Date: 10/02/2017  Patient name: Phillip [name]  Medical record number: [id number]  Date of birth: [birth year]   I have seen and evaluated this patient and I have discussed the plan of care with the house staff. Please see their note for complete details. I concur with their findings with the following additions/corrections: Julianne Handler was seen on AM rounds with team. Pt dyspnea and cough a but better but not yet a baseline. No fever. Has never required O2. Lung exam shows new bibasilar crackles.   1. Presumed CAP - to complete PO ABX course - 14th will be last dose.   2. Pul edema - likely iatrogenic from 4 L IV fluid resuscitation. Due to h/o systolic dysnfsn, we are repeating ECHO. Lasix 20 IV once.   I anticipate D/C 7/12.  Burns Spain, MD 10/02/2017, 2:09 PM

## 2017-10-03 DIAGNOSIS — A419 Sepsis, unspecified organism: Secondary | ICD-10-CM | POA: Diagnosis not present

## 2017-10-03 DIAGNOSIS — I110 Hypertensive heart disease with heart failure: Secondary | ICD-10-CM | POA: Diagnosis not present

## 2017-10-03 MED ORDER — AZITHROMYCIN 250 MG PO TABS: 250.0000 mg | ORAL_TABLET | Freq: Every day | ORAL | 0 refills | Status: AC

## 2017-10-03 MED ORDER — OMEPRAZOLE 20 MG PO CPDR: 20.0000 mg | DELAYED_RELEASE_CAPSULE | Freq: Every day | ORAL | 2 refills | Status: DC

## 2017-10-03 NOTE — Discharge Summary (Signed)
Name: Phillip [name] MRN: [id number] DOB: [birth year] 52 y.o. PCP: Reymundo Poll, MD  Date of Admission: 10/01/2017  1:00 AM Date of Discharge: 10/03/17 Attending Physician: Burns Spain, MD  Discharge Diagnosis: 1. Sepsis secondary to presumed community acquired pneumonia  2. Dyspnea  3. 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 AKI 5. HTN   Discharge Medications: Allergies as of 10/03/2017   No Known Allergies     Medication List    STOP taking these medications   triamcinolone cream 0.1 % Commonly known as:  KENALOG     TAKE these medications   azithromycin 250 MG tablet Commonly known as:  ZITHROMAX Take 1 tablet (250 mg total) by mouth daily for 2 days.   carvedilol 25 MG tablet Commonly known as:  COREG Take 0.5 tablets (12.5 mg total) by mouth 2 (two) times daily with a meal. HF program   enalapril 20 MG tablet Commonly known as:  VASOTEC Take 1 tablet (20 mg total) by mouth 2 (two) times daily. HF program   omeprazole 20 MG capsule Commonly known as:  PRILOSEC Take 1 capsule (20 mg total) by mouth daily. For abdominal pain   spironolactone 25 MG tablet Commonly known as:  ALDACTONE Take 1 tablet (25 mg total) by mouth daily. HF program       Disposition and follow-up:   [name] was discharged from [facility] in [place] condition.  At the hospital follow up visit please address:  1.  Sepsis secondary to presumed community acquired pneumonia - Successfully treated with CTX and Azithromycin. Discharged with Azithromycin 250 mg to finish course on 10/05/17  2.  Labs / imaging needed at time of follow-up: BMP  3.  Pending labs/ test needing follow-up: None   Follow-up Appointments:   Hospital Course by problem list: 1. Sepsis secondary to presumed community acquired pneumonia  Mr.Garray-Triminiois a 52 year old Hispanic male with a past medical history significant for systolic heart  failure diagnosed in April 2004 EF 50%(2017) most recent echo in 2019 EF 60-65%,hypertension, hyperlipidemia, obesity and major depressive disorder who presented with sudden onset fever, cough and shortness of breath. On arrival to the ED on 10/01/17 he was found to be dyspnic however normal O2 saturation, hypotensive to 90s/50s, febrile to 101.7 with leukocytosis of 16.2. He complained of headache, non-productive cough, chills and fever the day prior to admission. He received 4L bolus of NS with appropriate BP response. Chest x-ray revealed heterogeneous bibasilar opacities,Lab results showed procalcitonin of 0.28 and lactic acid of 0.7.Due to concern of sepsis secondary to presumed CAP, he was started on ceftriaxone 2 g and azithromycin 500 mg. Blood cultures, urine culture, HIV and respiratory viral panel were all negative. With treatment, he remained afebrile, with resolution of leukocytosis and cough. He was discharged on PO Azithromycin 250mg  to complete a 4-day course.    2. Dyspnea [name] made [place] aware that he recently travelledto [place] and reported of pain in his right lower extremity however physical exams did not show any swelling in his calf. In the setting of sudden onset shortness of breath and subjective LE pain, we worked him up for PE by Lower extremity Doppler which was negative for DVT. Even though, his Geneva score was 3 and Wells criteria score was 5 putting him at a moderate risk for PE, this was unlikely.    3. Systolic heart failure secondary to hypertensive cardiomyopathy [name] most recent echo done on June/20/2017 showed  ejection fraction 50%. During this admission,Chest x-ray showed progressive interstitial thickening throughout the lung suspicious of interstitial edema.There was a concerned for CHF exacerbation andordered BNP, troponin, Echo. Results showed normal BNP of 70.4, Negative troponin and Echo eith EF of 60-65%.  Subsequently, he  was found to have bilateral posterior bibasilar crackles on hospital day 1. He received a one-time dose of IV Lasix 20 mg with improvement in symptoms and physical exam findings.   3. Acute kidney injury, improved It was noted that [name] had experienced an acute kidney injury secondary to hypovolemia/hypoperfusion. Labs showed Creatinine of 1.36 and BUN of 24 on admission from baseline of 1.05. With adequate fluid resuscitation, his renal function improved.    4. Hypertension [name] was found to be hypotensiveto90s/50son admission in the setting of sepsis. He received a total of 4 L normal saline with maintenance fluid at 150 mL/hr. He responded appropriately with BP normalizing to 125/86. During this admission, his home antihypertensives were held. He takescarvedilol 12.5 mg twice daily and enalapril 20 mg twice daily at home.   Discharge Vitals:   BP (!) 141/88 (BP Location: Left Arm)   Pulse 66   Temp 97.7 F (36.5 C) (Oral)   Resp 16   Ht 5' 9.69" (1.77 m)   Wt 210 lb (95.3 kg)   SpO2 97%   BMI 30.40 kg/m   Pertinent Labs, Studies, and Procedures:  CXR 10/01/2017 IMPRESSION: Heterogeneous bibasilar opacities, left greater than right, favoring atelectasis, however pneumonia is considered in the setting of cough and fever.  CXR 10/01/17 IMPRESSION: Progressive interstitial thickening throughout the lungs, suspicious for interstitial edema, or less likely, atypical infection. No consolidation or significant pleural effusion.  Echo, 10/02/2017 LV EF: 60% - 65% Left ventricle: The cavity size was normal. Wall thickness was normal. Systolic function was normal. The estimated ejection fraction was in the range of 60% to 65%.   CBC Latest Ref Rng & Units 10/02/2017 10/01/2017 07/21/2015  WBC 4.0 - 10.5 K/uL 6.7 16.2(H) 9.2  Hemoglobin 13.0 - 17.0 g/dL 12.4(L) 14.2 11.5(L)  Hematocrit 39.0 - 52.0 % 38.1(L) 43.3 34.9(L)  Platelets 150 - 400 K/uL 217  275 439(H)   BMP Latest Ref Rng & Units 10/02/2017 10/01/2017 10/01/2017  Glucose 70 - 99 mg/dL 91 91 409(W)  BUN 6 - 20 mg/dL 17 20 11(B)  Creatinine 0.61 - 1.24 mg/dL 1.47 8.29 5.62(Z)  BUN/Creat Ratio 9 - 20 - - -  Sodium 135 - 145 mmol/L 137 138 139  Potassium 3.5 - 5.1 mmol/L 3.9 4.0 4.0  Chloride 98 - 111 mmol/L 103 108 107  CO2 22 - 32 mmol/L 25 24 20(L)  Calcium 8.9 - 10.3 mg/dL 3.0(Q) 6.5(H) 9.3    Discharge Instructions: Discharge Instructions    Call MD for:  difficulty breathing, headache or visual disturbances   Complete by:  As directed    Call MD for:  extreme fatigue   Complete by:  As directed    Call MD for:  hives   Complete by:  As directed    Call MD for:  persistant dizziness or light-headedness   Complete by:  As directed    Call MD for:  persistant nausea and vomiting   Complete by:  As directed    Call MD for:  redness, tenderness, or signs of infection (pain, swelling, redness, odor or green/yellow discharge around incision site)   Complete by:  As directed    Call MD for:  severe uncontrolled  pain   Complete by:  As directed    Call MD for:  temperature >100.4   Complete by:  As directed    Diet - low sodium heart healthy   Complete by:  As directed    Discharge instructions   Complete by:  As directed    You were seen at the hospital because of a lung infection, fever and a low blood pressure.  We gave you some antibiotics and fluids which improved your symptoms and your blood work.  You will take 2 more days of the antibiotic.   I am also given you a work note   Increase activity slowly   Complete by:  As directed       Signed: Yvette Rack, MD 10/03/2017, 10:50 AM   Pager: [phone]

## 2017-10-03 NOTE — Progress Notes (Signed)
Subjective: Day 2  Overnight: Transition to p.o. antibiotics  Today, [name] was examined at bedside with his son reports of significant improvement.  He is able to tolerate p.o. intake and ambulate by himself.  He denies chest pain, palpitation, headache, nausea, vomiting.  He has had adequate urine output and consistent bowel movement and he also reports that his cough has improved.  Objective:  Vital signs in last 24 hours: Vitals:   10/01/17 2313 10/02/17 0822 10/02/17 1656 10/03/17 0013  BP: 121/72 133/75 131/80 125/86  Pulse: 70 67 70 63  Resp: 16 18 18    Temp: 98.4 F (36.9 C) (!) 97.4 F (36.3 C) 99.3 F (37.4 C) 98.1 F (36.7 C)  TempSrc: Oral Oral Oral Oral  SpO2: 95% 93% 98% 99%  Weight:      Height:       Physical exams: Constitutional: In no acute distress, lying in bed HEENT: Atraumatic, normocephalic Cardiovascular: RRR, no murmurs, gallops, rubs Respiratory: Few crackles heard at the right base posteriorly.  Improved from yesterday Abdomen: Soft, nondistended, nontender to palpation Extremities: No lower extremity edema  Echo, 10/02/2017 LV EF: 60% -   65% Left ventricle:  The cavity size was normal. Wall thickness was normal. Systolic function was normal. The estimated ejection fraction was in the range of 60% to 65%.    Assessment/Plan:  Active Problems:   Community acquired pneumonia   Sepsis (HCC)   Cough  Mr.Garray-Triminiois a 52 year old Hispanic male with a past medical history significant for systolic heart failure diagnosed in April 2004 EF 50%(2017),hypertension, hyperlipidemia, obesity and major depressive disorder who presented with sudden onset fever, cough and shortness of breath.  Sepsis secondary to presumed community-acquired pneumonia Patient had a sudden onset dyspnea, nonproductive cough, headache, chills and fever prior to presenting to the ED on 10/01/2017. In the ED he was febrile to 101.7, leukocytosis of 16.2  and hypotensive with recorded BP 90s/50s.Initial chest x-ray in the ED revealed heterogeneous bibasilar opacities.Initial lab results reviewed procalcitonin of 0.28 and lactic acid of 0.7.Due to concern of sepsis secondary to CAP, started on ceftriaxone 2 g and azithromycin 500 mg.  -Status post4Lliters NS bolus with maintenance which has been discontinued -Borderline procalcitonin 0.28 -Lactic acid 0.7 -HIV nonreactive -Leukocytosis resolved -Azithromycin  -----s/p 500mg  IV x1 day -----s/p 250 mg IV x1day -----> Currently on p.o. azithromycin 250mg  daily (Day 1/3) end date 10/05/17 -Ceftriaxone 2 g IV --> discontinued on 10/02/17.  -Blood cultures no growth to date. -Negative group a strep PCR -UA negative  Dyspnea -Patient recently travelledto [place] and reports pain in his right lower extremity in the setting of sudden onset shortness of breath and general pain however physical exams did not show any swelling of the lower extremity -His Geneva score was 3 and Wells criteria score was 5 putting him at a moderate risk for PE -Lower extremity Doppler was negative for DVT and given his decreased risk, PE is unlikely -Normal EKG -Status post one-time dose of IV Lasix 20 mg as previous physical exams revealed bibasilar crackles.  Systolic heart failure secondary to hypertensive cardiomyopathy Most recent echo done on June/20/2017 showed ejection fraction 50%.   Echocardiogram was repeated on 10/02/2017 and showed EF 60% to 65% with normal systolic function. Chest x-ray showed progressive interstitial thickening throughout the lung suspicious of interstitial edema.I was concerned for CHF exacerbation andordered BNP, troponin, echo.  -BNP 70.4 -Troponin negative -Echo results pending  -Status post 4 L IV fluid which has  been discontinued -Patient is currently taking carvedilol 12.5 mg twice daily and enalapril 20 mg twice daily at home -Per chart review he had a normal cath at  [facility] in 2014.   Acute kidney injury, improved -Secondary to hypovolemia -Creatinine of 1.36 and BUN of 24 on admission from baseline of 1.05 -Currently creatinine has normalized  -Status post4Lliters NS bolus with maintenance which has been discontinued  Hypertension -Hypotensive to90s/50son admission in the setting of sepsis -Status post 3 L normal saline with maintenance -Blood pressure currently stable -We will hold home antihypertensives (carvedilol 12.5 mg twice daily and enalapril 20 mg twice daily)    GOT:LXBWIOM electrolytes as needed, heart healthy diet VTE ppx: Lovenox  Code Status: FULL  Dispo: Anticipated discharge today.    Yvette Rack, MD 10/03/2017, 7:16 AM Pager: [phone]

## 2017-10-06 LAB — CULTURE, BLOOD (ROUTINE X 2)
CULTURE: NO GROWTH
CULTURE: NO GROWTH

## 2017-10-16 ENCOUNTER — Ambulatory Visit: Payer: Self-pay

## 2018-03-13 ENCOUNTER — Ambulatory Visit (INDEPENDENT_AMBULATORY_CARE_PROVIDER_SITE_OTHER): Payer: BLUE CROSS/BLUE SHIELD | Admitting: Internal Medicine

## 2018-03-13 ENCOUNTER — Encounter: Payer: Self-pay | Admitting: Internal Medicine

## 2018-03-13 VITALS — BP 136/75 | HR 74 | Temp 98.5°F | Ht 68.0 in | Wt 215.5 lb

## 2018-03-13 DIAGNOSIS — M542 Cervicalgia: Secondary | ICD-10-CM

## 2018-03-13 DIAGNOSIS — R110 Nausea: Secondary | ICD-10-CM | POA: Diagnosis not present

## 2018-03-13 DIAGNOSIS — H9319 Tinnitus, unspecified ear: Secondary | ICD-10-CM

## 2018-03-13 DIAGNOSIS — G44209 Tension-type headache, unspecified, not intractable: Secondary | ICD-10-CM | POA: Diagnosis not present

## 2018-03-13 DIAGNOSIS — Z947 Corneal transplant status: Secondary | ICD-10-CM

## 2018-03-13 DIAGNOSIS — H538 Other visual disturbances: Secondary | ICD-10-CM | POA: Diagnosis not present

## 2018-03-13 DIAGNOSIS — H8190 Unspecified disorder of vestibular function, unspecified ear: Secondary | ICD-10-CM | POA: Insufficient documentation

## 2018-03-13 DIAGNOSIS — R42 Dizziness and giddiness: Secondary | ICD-10-CM | POA: Diagnosis not present

## 2018-03-13 NOTE — Assessment & Plan Note (Signed)
Patient presented today for evaluation of headache and dizziness for the past 2 weeks.  He stated the headache is constant and mostly in the posterior side of his head and neck.  He has dizziness sometimes, only sometimes worsened with postural movements.  States that sometimes he feels that the room is spinning.  He has some associated nausea.  He has chronic tinnitus and changes in his left eye most likely due to strain due to the fact that he wears a patch over his right eyes secondary to a corneal surgery.  He denied any recent falls, trauma or injuries.  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  Denies any new stressors in his life or changes in medications.  On exam, he was tender to palpation on his posterior head and neck and upper trapezius muscle region.  Limited range of motion on left rotation.  Neuro exam was unremarkable.  His left eye had normal extraocular movements with some strain.  His headaches are most likely due to muscle tension.  Plan: - Alternate Tylenol and ibuprofen use, use heating pad and/or IcyHot - Patient is to call the clinic if his symptoms persist or worsen for possible imaging

## 2018-03-13 NOTE — Patient Instructions (Addendum)
Phillip [name],  Lamento escuchar que has tenido [employer] de cabeza durante 2 semanas. Lo ms probable es que se deba a la tensin muscular en la parte posterior de la cabeza y el cuello. Quiero que intente tomar Tylenol 650 mg cada 6 horas segn sea necesario. Alterne usando Tylenol con ibuprofeno 800 mg cada 8 horas segn sea necesario. Por ejemplo, si [place] Tylenol hoy, intente usar ibuprofeno maana. Tambin puede intentar usar una almohadilla trmica o una pomada helada en la parte posterior de la cabeza y el cuello que ayudar a Paramedic parte de la tensin muscular.  Tambin desea que haga un seguimiento con su oftalmlogo lo antes posible para asegurarse de que esto no empeore debido a los cambios en su visin.  Si esto no [place] su dolor de [place], quiero que vuelva a vernos. Llmenos en una semana o 2 si las cosas no mejoran.  Gracias, Dr. Karilyn Cota    [name],   I am sorry to hear that you have been having this headache for 2 weeks.  It is most likely due to muscle tension in the back of your head and neck.  I want you to try taking Tylenol 650 mg every 6 hours as needed.  Alternate using Tylenol with ibuprofen 800 mg every 8 hours as needed.  For example if you use Tylenol today, try using ibuprofen tomorrow.  You can also try using a heating pad or icy hot ointment to the back of your head and your neck that will help alleviate some of the muscle tension.  Also want you to follow-up with your eye doctor soon as possible to make sure that this is not being worsened due to changes in your vision.  If this does not help your headache I want you to come back to see [place].  Give [place] a call in a week or 2 if things do not get better.

## 2018-03-13 NOTE — Assessment & Plan Note (Signed)
Patient presented today for evaluation of headache and dizziness for the past 2 weeks.  He stated the headache is constant and mostly in the posterior side of his head and neck.  He has dizziness sometimes, only sometimes worsened with postural movements.  States that sometimes he feels that the room is spinning.  He has some associated nausea.  He has chronic tinnitus and changes in his left eye most likely due to strain due to the fact that he wears a patch over his right eyes secondary to a corneal surgery.  He denied any recent falls, trauma or injuries.  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  Denies any new stressors in his life or changes in medications.  On exam, he was tender to palpation on his posterior head and neck and upper trapezius muscle region.  Limited range of motion on left rotation.  Neuro exam was unremarkable.  His left eye had normal extraocular movements with some strain.  His headaches are most likely due to muscle tension.   Unclear etiology of dizziness but possibly due to left eye strain.  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  He cannot remember the last time he saw his eye physician.  Recommended that he follow-up with his eye doctor soon as possible to see if he is having left eye vision changes.  Possibly be vertigo due to sensation of room spinning and tinnitus.  Patient is to call the clinic if symptoms persist or worsen.  Plan: - Follow-up with eye doctor

## 2018-03-13 NOTE — Progress Notes (Signed)
   CC: dizziness and headaches  HPI:  [name] is a 52 y.o. male with a PMHx listed below presenting for evaluation of dizziness and headaches. For details of today's visit and the status of his chronic medical issues please refer to the assessment and plan.   Past Medical History:  Diagnosis Date  . Cardiac insufficiency (HCC)   . CHF (congestive heart failure) (HCC)   . Chronic back pain    "neck down" (07/20/2013)  . Chronic systolic heart failure (HCC) 06/15/2014  . GERD (gastroesophageal reflux disease)   . High cholesterol   . Hypertension   . Renal calculus    Review of Systems:   Review of Systems  Constitutional: Positive for malaise/fatigue. Negative for chills and fever.  HENT: Positive for tinnitus.   Eyes: Positive for blurred vision and pain.  Gastrointestinal: Positive for nausea. Negative for abdominal pain and blood in stool.  Genitourinary: Negative for hematuria.  Musculoskeletal: Positive for myalgias. Negative for back pain, falls, joint pain and neck pain.  Neurological: Positive for dizziness and headaches. Negative for focal weakness, loss of consciousness and weakness.  All other systems reviewed and are negative.    Physical Exam:  Vitals:   03/13/18 1336  BP: 136/75  Pulse: 74  Temp: 98.5 F (36.9 C)  TempSrc: Oral  SpO2: 98%  Weight: 215 lb 8 oz (97.8 kg)  Height: 5\' 8"  (1.727 m)    Physical Exam Constitutional:      Appearance: He is well-developed.  HENT:     Head: Normocephalic and atraumatic.  Eyes:     Extraocular Movements:     Left eye: Normal extraocular motion.     Pupils:     Left eye: Pupil is round and reactive.  Neck:     Musculoskeletal: No neck rigidity.     Comments: Restricted ROM of left neck rotation Cardiovascular:     Rate and Rhythm: Normal rate and regular rhythm.     Heart sounds: Normal heart sounds. No murmur.  Abdominal:     General: Bowel sounds are normal. There is no distension.    Palpations: Abdomen is soft.     Tenderness: There is no abdominal tenderness.  Musculoskeletal:        General: Tenderness present.     Comments: Tender to palpation on posterior neck and trapezius   Skin:    General: Skin is warm and dry.  Neurological:     Mental Status: He is alert.     Cranial Nerves: No cranial nerve deficit.  Psychiatric:        Mood and Affect: Mood normal.        Speech: Speech normal.        Behavior: Behavior normal.     Assessment & Plan:   See Encounters Tab for problem based charting.  Patient seen with Dr. Cleda Daub

## 2018-03-17 NOTE — Progress Notes (Signed)
Internal Medicine Clinic Attending  I saw and evaluated the patient.  I personally confirmed the key portions of the history and exam documented by Dr.  Rehman  and I reviewed pertinent patient test results.  The assessment, diagnosis, and plan were formulated together and I agree with the documentation in the resident's note.  

## 2018-04-14 ENCOUNTER — Other Ambulatory Visit: Payer: Self-pay | Admitting: Critical Care Medicine

## 2018-04-14 ENCOUNTER — Encounter: Payer: Self-pay | Admitting: Pediatric Intensive Care

## 2018-04-14 DIAGNOSIS — I5022 Chronic systolic (congestive) heart failure: Secondary | ICD-10-CM

## 2018-04-14 DIAGNOSIS — I10 Essential (primary) hypertension: Secondary | ICD-10-CM

## 2018-04-14 MED ORDER — CARVEDILOL 25 MG PO TABS: 12.5000 mg | ORAL_TABLET | Freq: Two times a day (BID) | ORAL | 11 refills | Status: DC

## 2018-04-14 MED ORDER — ENALAPRIL MALEATE 20 MG PO TABS: 20.0000 mg | ORAL_TABLET | Freq: Two times a day (BID) | ORAL | 11 refills | Status: DC

## 2018-04-14 MED ORDER — SPIRONOLACTONE 25 MG PO TABS: 25.0000 mg | ORAL_TABLET | Freq: Every day | ORAL | 11 refills | Status: DC

## 2018-04-14 NOTE — Progress Notes (Signed)
Needs bridging meds until can obtain insurance.   Is seen in [facility] ambulatory clinic  Congregational RN seeing patient in [facility]  Shan Levans

## 2018-04-14 NOTE — Congregational Nurse Program (Signed)
  Dept: [phone]   Congregational Nurse Program Note  Date of Encounter: 04/14/2018  Past Medical History: Past Medical History:  Diagnosis Date  . Cardiac insufficiency (HCC)   . CHF (congestive heart failure) (HCC)   . Chronic back pain    "neck down" (07/20/2013)  . Chronic systolic heart failure (HCC) 06/15/2014  . GERD (gastroesophageal reflux disease)   . High cholesterol   . Hypertension   . Renal calculus     Encounter Details: Client here for BP check. States that he no longer has health insurance. He took his last Bp medication today. He is going to file for [facility] and his alternative is filing for an [employer]. He currently has refills at [facility]. He states that it would be a financial hardship to pay for medication at this time. Follow up with CN tomorrow. Shann Medal RN BSN CNP [phone]

## 2018-05-05 ENCOUNTER — Encounter: Payer: Self-pay | Admitting: Pediatric Intensive Care

## 2018-05-18 ENCOUNTER — Ambulatory Visit (INDEPENDENT_AMBULATORY_CARE_PROVIDER_SITE_OTHER): Payer: Self-pay | Admitting: Internal Medicine

## 2018-05-18 DIAGNOSIS — I10 Essential (primary) hypertension: Secondary | ICD-10-CM

## 2018-05-18 DIAGNOSIS — Z79899 Other long term (current) drug therapy: Secondary | ICD-10-CM

## 2018-05-18 DIAGNOSIS — I5022 Chronic systolic (congestive) heart failure: Secondary | ICD-10-CM

## 2018-05-18 MED ORDER — CARVEDILOL 25 MG PO TABS: 12.5000 mg | ORAL_TABLET | Freq: Two times a day (BID) | ORAL | 11 refills | Status: DC

## 2018-05-18 MED ORDER — SPIRONOLACTONE 25 MG PO TABS: 25.0000 mg | ORAL_TABLET | Freq: Every day | ORAL | 11 refills | Status: DC

## 2018-05-18 MED ORDER — ENALAPRIL MALEATE 20 MG PO TABS: 20.0000 mg | ORAL_TABLET | Freq: Two times a day (BID) | ORAL | 11 refills | Status: DC

## 2018-05-18 NOTE — Patient Instructions (Addendum)
[name],   Hoy le rellenamos sus medicamentos. Llamenos si tiene algun pregunta o duda.   - Dra. Evelene Croon

## 2018-05-19 ENCOUNTER — Encounter: Payer: Self-pay | Admitting: Internal Medicine

## 2018-05-19 NOTE — Assessment & Plan Note (Signed)
Blood pressure is well controlled on current regimen.  Will continue.  Please see assessment and plan for chronic systolic heart failure for further details.

## 2018-05-19 NOTE — Assessment & Plan Note (Signed)
Phillip [name] presents for medication refill.  He has a history of chronic systolic heart failure thought to be secondary to hypertensive cardiomyopathy.  EF initially 30-35% in 2016 which recovered to 50% in 2017 after initiation of evidence-based therapies.  He is currently on carvedilol 12.5 mg twice daily, enalapril 20 twice daily, and spironolactone 25 daily.  Reports compliance of medications.  Appears well compensated today and denies exertional symptoms. BP is at goal. Will continue current regimen, medications refilled.

## 2018-05-19 NOTE — Progress Notes (Signed)
   CC: HF and HTN follow up   HPI:  [name] is a 53 y.o. year-old male with PMH listed below who presents to clinic for follow-up of heart failure and hypertension. Please see problem based assessment and plan for further details.   Past Medical History:  Diagnosis Date  . Cardiac insufficiency (HCC)   . CHF (congestive heart failure) (HCC)   . Chronic back pain    "neck down" (07/20/2013)  . Chronic systolic heart failure (HCC) 06/15/2014  . GERD (gastroesophageal reflux disease)   . High cholesterol   . Hypertension   . Renal calculus    Review of Systems:   Review of Systems  Constitutional: Negative for chills, fever and malaise/fatigue.  Respiratory: Negative for shortness of breath.   Cardiovascular: Negative for chest pain, palpitations, orthopnea, leg swelling and PND.  Neurological: Negative for dizziness and headaches.    Physical Exam: Vitals:   05/18/18 1551  BP: 133/83  Pulse: 89  Temp: 98.1 F (36.7 C)  TempSrc: Oral  SpO2: 100%  Weight: 212 lb 4.8 oz (96.3 kg)  Height: 5\' 8"  (1.727 m)   General: Well-appearing male in no acute distress Cardiac: regular rate and rhythm, nl S1/S2, no murmurs, rubs or gallops, no JVD  Pulm: CTAB, no wheezes or crackles, no increased work of breathing on room air  Ext: warm and well perfused, no peripheral edema   Assessment & Plan:   See Encounters Tab for problem based charting.  Patient discussed with Dr. Cleda Daub

## 2018-05-20 NOTE — Progress Notes (Signed)
Internal Medicine Clinic Attending  Case discussed with Dr. Santos-Sanchez at the time of the visit.  We reviewed the resident's history and exam and pertinent patient test results.  I agree with the assessment, diagnosis, and plan of care documented in the resident's note.    

## 2018-06-04 NOTE — Congregational Nurse Program (Signed)
  Dept: [phone]   Congregational Nurse Program Note  Date of Encounter: 05/05/2018  Past Medical History: Past Medical History:  Diagnosis Date  . Cardiac insufficiency (HCC)   . CHF (congestive heart failure) (HCC)   . Chronic back pain    "neck down" (07/20/2013)  . Chronic systolic heart failure (HCC) 06/15/2014  . GERD (gastroesophageal reflux disease)   . High cholesterol   . Hypertension   . Renal calculus     Encounter Details: BP check

## 2018-06-12 ENCOUNTER — Ambulatory Visit: Payer: Self-pay | Admitting: Internal Medicine

## 2018-07-10 ENCOUNTER — Other Ambulatory Visit: Payer: Self-pay

## 2018-07-10 ENCOUNTER — Telehealth: Payer: Self-pay | Admitting: *Deleted

## 2018-07-10 ENCOUNTER — Ambulatory Visit (INDEPENDENT_AMBULATORY_CARE_PROVIDER_SITE_OTHER): Payer: Self-pay | Admitting: Internal Medicine

## 2018-07-10 VITALS — BP 118/71 | HR 70 | Temp 98.2°F | Wt 220.3 lb

## 2018-07-10 DIAGNOSIS — T5491XA Toxic effect of unspecified corrosive substance, accidental (unintentional), initial encounter: Secondary | ICD-10-CM

## 2018-07-10 DIAGNOSIS — R112 Nausea with vomiting, unspecified: Secondary | ICD-10-CM

## 2018-07-10 DIAGNOSIS — T6591XA Toxic effect of unspecified substance, accidental (unintentional), initial encounter: Secondary | ICD-10-CM | POA: Insufficient documentation

## 2018-07-10 HISTORY — DX: Toxic effect of unspecified substance, accidental (unintentional), initial encounter: T65.91XA

## 2018-07-10 NOTE — Progress Notes (Signed)
Medicine attending: Medical history, presenting problems,  and medications, reviewed with resident physician Dr Levora Dredge on the day of the patient telephone consultation and I concur with his evaluation and management plan. Working w Therapist, sports yesterday; became light-headed; subsequent N/v. Sxs resolved today. Advised to call if sxs recur.

## 2018-07-10 NOTE — Telephone Encounter (Signed)
NURSING TRIAGE NOTE FOR RESPIRATORY SYMPTOMS  Do you have a fever? No  Do you ha ve a cough? No Do you have shortness of breath more than normal? No Do you have chest pain?No Are you able to eat and drink normally? Yes-but vomited this am  Have you seen a physician for these symptoms? No  Action pt has already been given an appt for today at 1:45pm   Pt screened for COVID19 prior to scheduled appt in [facility]  Call made using [facility] [employer] [phone] [facility] ID# [phone]

## 2018-07-10 NOTE — Progress Notes (Signed)
   CC: Nausea/vomiting  HPI:  [name] is a 53 y.o. male with PMHx listed below presenting for Nausea/vomiting. Please see the A&P for the status of the patient's chronic medical problems.  Past Medical History:  Diagnosis Date  . Cardiac insufficiency (HCC)   . CHF (congestive heart failure) (HCC)   . Chronic back pain    "neck down" (07/20/2013)  . Chronic systolic heart failure (HCC) 06/15/2014  . GERD (gastroesophageal reflux disease)   . High cholesterol   . Hypertension   . Renal calculus    Review of Systems:  Performed and all others negative.  Physical Exam: Vitals:   07/10/18 1400  BP: 118/71  Pulse: 70  Temp: 98.2 F (36.8 C)  TempSrc: Oral  SpO2: 99%  Weight: 220 lb 4.8 oz (99.9 kg)   General: Well nourished male in no acute distress Pulm: Good air movement with no wheezing or crackles  CV: RRR, no murmurs, no rubs  Abdomen: Active bowel sounds, soft, non-distended, no tenderness to palpation   Assessment & Plan:   See Encounters Tab for problem based charting.  Patient discussed with Dr. Cyndie Chime

## 2018-07-10 NOTE — Patient Instructions (Signed)
Thank you for allowing [place] to provide your care. I'm glad that you're feeling better. It is definitely possible that her symptoms were related to the chemical. Whenever you are working with chemicals please make sure you're wearing proper personal protective equipment. _____________________________________________________________  Karl Pock por permitirnos brindarle su atencin. Me alegra que te sientas mejor. Definitivamente es posible que sus sntomas estuvieran relacionados con el qumico. Siempre que trabaje con productos qumicos, asegrese de usar el equipo de proteccin personal adecuado.

## 2018-07-10 NOTE — Assessment & Plan Note (Signed)
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 He subsequently went home and over the next preceding hours began to feel much better. Today he feels like he has back to his baseline but did have some nausea/vomiting this morning. He denies fevers, chills, chest pain, shortness of breath, palpitations, abdominal pain, diarrhea. He denies changes in diet or eating anything different yesterday. States that he has never felt like this before. He was wearing PPE that protected him against particulates but not against vapors.  A/P: - Symptoms have resolved. As well this is related to chemical exposures. He will call back if he has recurrence of symptoms.

## 2018-09-17 ENCOUNTER — Telehealth: Payer: Self-pay | Admitting: Pediatric Intensive Care

## 2018-09-17 NOTE — Telephone Encounter (Signed)
Call to client with [facility] Gander. Client states he has had Covid exposure via family member. He is asking about testing sites. Information on testing sites for the next few days given. CN offered support for client and advised that he should call [facility] if he should require support. Lisette Abu RN BSN CNP [phone]

## 2018-10-06 ENCOUNTER — Other Ambulatory Visit: Payer: Self-pay | Admitting: *Deleted

## 2018-10-06 DIAGNOSIS — Z20822 Contact with and (suspected) exposure to covid-19: Secondary | ICD-10-CM

## 2018-10-11 LAB — NOVEL CORONAVIRUS, NAA: SARS-CoV-2, NAA: NOT DETECTED

## 2018-10-12 ENCOUNTER — Encounter: Payer: Self-pay | Admitting: Internal Medicine

## 2018-10-20 ENCOUNTER — Emergency Department (HOSPITAL_COMMUNITY): Payer: Self-pay

## 2018-10-20 ENCOUNTER — Emergency Department (HOSPITAL_COMMUNITY)
Admission: EM | Admit: 2018-10-20 | Discharge: 2018-10-20 | Disposition: A | Discharge status: Home / Self Care | Payer: Self-pay | Attending: Emergency Medicine | Admitting: Emergency Medicine

## 2018-10-20 ENCOUNTER — Encounter (HOSPITAL_COMMUNITY): Payer: Self-pay | Admitting: Emergency Medicine

## 2018-10-20 ENCOUNTER — Other Ambulatory Visit: Payer: Self-pay

## 2018-10-20 DIAGNOSIS — Z87442 Personal history of urinary calculi: Secondary | ICD-10-CM | POA: Insufficient documentation

## 2018-10-20 DIAGNOSIS — R350 Frequency of micturition: Secondary | ICD-10-CM | POA: Insufficient documentation

## 2018-10-20 DIAGNOSIS — I5022 Chronic systolic (congestive) heart failure: Secondary | ICD-10-CM | POA: Insufficient documentation

## 2018-10-20 DIAGNOSIS — R109 Unspecified abdominal pain: Secondary | ICD-10-CM

## 2018-10-20 DIAGNOSIS — R1032 Left lower quadrant pain: Secondary | ICD-10-CM | POA: Insufficient documentation

## 2018-10-20 DIAGNOSIS — I110 Hypertensive heart disease with heart failure: Secondary | ICD-10-CM | POA: Insufficient documentation

## 2018-10-20 DIAGNOSIS — Z79899 Other long term (current) drug therapy: Secondary | ICD-10-CM | POA: Insufficient documentation

## 2018-10-20 DIAGNOSIS — R10817 Generalized abdominal tenderness: Secondary | ICD-10-CM | POA: Insufficient documentation

## 2018-10-20 LAB — URINALYSIS, ROUTINE W REFLEX MICROSCOPIC
Bilirubin Urine: NEGATIVE
Glucose, UA: NEGATIVE mg/dL
Hgb urine dipstick: NEGATIVE
Ketones, ur: NEGATIVE mg/dL
Leukocytes,Ua: NEGATIVE
Nitrite: NEGATIVE
Protein, ur: NEGATIVE mg/dL
Specific Gravity, Urine: 1.019 (ref 1.005–1.030)
pH: 5 (ref 5.0–8.0)

## 2018-10-20 LAB — COMPREHENSIVE METABOLIC PANEL
ALT: 29 U/L (ref 0–44)
AST: 21 U/L (ref 15–41)
Albumin: 4.1 g/dL (ref 3.5–5.0)
Alkaline Phosphatase: 50 U/L (ref 38–126)
Anion gap: 9 (ref 5–15)
BUN: 24 mg/dL — ABNORMAL HIGH (ref 6–20)
CO2: 26 mmol/L (ref 22–32)
Calcium: 9.9 mg/dL (ref 8.9–10.3)
Chloride: 103 mmol/L (ref 98–111)
Creatinine, Ser: 0.99 mg/dL (ref 0.61–1.24)
GFR calc Af Amer: >60 mL/min (ref >60)
GFR calc non Af Amer: >60 mL/min (ref >60)
Glucose, Bld: 99 mg/dL (ref 70–99)
Potassium: 4 mmol/L (ref 3.5–5.1)
Sodium: 138 mmol/L (ref 135–145)
Total Bilirubin: 0.7 mg/dL (ref 0.3–1.2)
Total Protein: 7.9 g/dL (ref 6.5–8.1)

## 2018-10-20 LAB — CBC
HCT: 42.1 % (ref 39.0–52.0)
Hemoglobin: 14.1 g/dL (ref 13.0–17.0)
MCH: 29.3 pg (ref 26.0–34.0)
MCHC: 33.5 g/dL (ref 30.0–36.0)
MCV: 87.3 fL (ref 80.0–100.0)
Platelets: 340 10*3/uL (ref 150–400)
RBC: 4.82 MIL/uL (ref 4.22–5.81)
RDW: 14.1 % (ref 11.5–15.5)
WBC: 6.8 10*3/uL (ref 4.0–10.5)
nRBC: 0 % (ref 0.0–0.2)

## 2018-10-20 LAB — LIPASE, BLOOD: Lipase: 33 U/L (ref 11–51)

## 2018-10-20 MED ORDER — KETOROLAC TROMETHAMINE 30 MG/ML IJ SOLN
15.0000 mg | Freq: Once | INTRAMUSCULAR | Status: AC
  Administered 2018-10-20: 15 mg via INTRAVENOUS
  Filled 2018-10-20: qty 1

## 2018-10-20 MED ORDER — PREDNISONE 20 MG PO TABS: 40.0000 mg | ORAL_TABLET | Freq: Every day | ORAL | 0 refills | Status: DC

## 2018-10-20 MED ORDER — OXYCODONE-ACETAMINOPHEN 5-325 MG PO TABS
1.0000 | ORAL_TABLET | ORAL | Status: DC | PRN
  Administered 2018-10-20: 1 via ORAL
  Filled 2018-10-20: qty 1

## 2018-10-20 MED ORDER — HYDROCODONE-ACETAMINOPHEN 5-325 MG PO TABS
1.0000 | ORAL_TABLET | Freq: Four times a day (QID) | ORAL | 0 refills | Status: DC | PRN
Start: 2018-10-20 — End: 2018-12-21

## 2018-10-20 MED ORDER — SODIUM CHLORIDE 0.9% FLUSH
3.0000 mL | Freq: Once | INTRAVENOUS | Status: AC
  Administered 2018-10-20: 3 mL via INTRAVENOUS

## 2018-10-20 NOTE — ED Triage Notes (Signed)
Onset one day ago developed left flank pain radiating to LLQ abdomen with dysuria. States history of kidney stones in the past.

## 2018-10-20 NOTE — ED Provider Notes (Signed)
[facility] EMERGENCY DEPARTMENT Provider Note   CSN: [id number] Arrival date & time: 10/20/18  1100    History   Chief Complaint Chief Complaint  Patient presents with  . Flank Pain  . Abdominal Pain  . Dysuria    HPI Phillip [name] is a 53 y.o. male.     HPI Patient presents with abdominal pain. mostly left flank over the left lower abdomen.  Began yesterday.  Worse with certain movements.  States has some urinary frequency.  No fevers.  States he had kidney stones in 1992 in 1996.  States this feels the same.  No nausea or vomiting.  No fevers.  No diarrhea or constipation.  Patient speaks Spanish and was translated by family member. Past Medical History:  Diagnosis Date  . Cardiac insufficiency ([place])   . CHF (congestive heart failure) ([place])   . Chronic back pain    "neck down" (07/20/2013)  . Chronic systolic heart failure ([place]) 06/15/2014  . GERD (gastroesophageal reflux disease)   . High cholesterol   . Hypertension   . Renal calculus     Patient Active Problem List   Diagnosis Date Noted  . Toxic effect of chemicals 07/10/2018  . Acute non intractable tension-type headache 03/13/2018  . Dizziness 03/13/2018  . Cough   . Community acquired pneumonia 10/01/2017  . Sepsis ([place])   . Abdominal pain 06/16/2017  . Skin rash 10/28/2016  . History of corneal transplant 07/08/2016  . MDD (major depressive disorder) 12/25/2015  . Upper GI bleed 07/24/2015  . Plantar fasciitis 06/15/2015  . Obesity 04/24/2015  . Erectile dysfunction 03/02/2015  . Healthcare maintenance 12/29/2014  . Chronic systolic heart failure ([place]) 06/15/2014  . Hyperlipidemia 05/03/2014  . HTN (hypertension) 11/15/2012  . Chest pain 11/15/2012    Past Surgical History:  Procedure Laterality Date  . CARDIAC CATHETERIZATION  2014?  [name] CATARACT EXTRACTION W/ INTRAOCULAR LENS IMPLANT Right 02/16/2014  . CORNEAL TRANSPLANT Right ~ 2010  . EYE SURGERY Right   . LEFT  HEART CATHETERIZATION WITH CORONARY ANGIOGRAM N/A 05/26/2014   Procedure: LEFT HEART CATHETERIZATION WITH CORONARY ANGIOGRAM;  Surgeon: Larey Dresser, MD;  Location: [facility] CATH LAB;  Service: Cardiovascular;  Laterality: N/A;        Home Medications    Prior to Admission medications   Medication Sig Start Date End Date Taking? Authorizing Provider  carvedilol (COREG) 25 MG tablet Take 0.5 tablets (12.5 mg total) by mouth 2 (two) times daily with a meal. HF program 05/18/18   Welford Roche, MD  enalapril (VASOTEC) 20 MG tablet Take 1 tablet (20 mg total) by mouth 2 (two) times daily. HF program 05/18/18   Welford Roche, MD  HYDROcodone-acetaminophen (NORCO/VICODIN) 5-325 MG tablet Take 1-2 tablets by mouth every 6 (six) hours as needed. 10/20/18   Davonna Belling, MD  omeprazole (PRILOSEC) 20 MG capsule Take 1 capsule (20 mg total) by mouth daily. For abdominal pain 10/03/17   Jean Rosenthal, MD  predniSONE (DELTASONE) 20 MG tablet Take 2 tablets (40 mg total) by mouth daily. 10/20/18   Davonna Belling, MD  spironolactone (ALDACTONE) 25 MG tablet Take 1 tablet (25 mg total) by mouth daily. HF program 05/18/18   Welford Roche, MD    Family History No family history on file.  Social History Social History   Tobacco Use  . Smoking status: Never Smoker  . Smokeless tobacco: Never Used  Substance Use Topics  . Alcohol use: No  Alcohol/week: 0.0 standard drinks  . Drug use: No     Allergies   Patient has no known allergies.   Review of Systems Review of Systems  Constitutional: Negative for appetite change.  HENT: Negative for congestion.   Cardiovascular: Negative for chest pain.  Gastrointestinal: Positive for abdominal pain. Negative for nausea.  Genitourinary: Positive for flank pain. Negative for discharge and penile pain.  Musculoskeletal: Negative for back pain.  Skin: Negative for wound.  Neurological: Negative for weakness.   Psychiatric/Behavioral: Negative for confusion.     Physical Exam Updated Vital Signs BP 128/82   Pulse (!) 57   Temp 97.7 F (36.5 C) (Oral)   Resp 18   Ht 5\' 9"  (1.753 m)   Wt 92 kg   SpO2 97%   BMI 29.95 kg/m   Physical Exam Vitals signs and nursing note reviewed.  HENT:     Head: Normocephalic.  Eyes:     Extraocular Movements: Extraocular movements intact.     Pupils: Pupils are equal, round, and reactive to light.  Cardiovascular:     Rate and Rhythm: Regular rhythm.  Abdominal:     Tenderness: There is abdominal tenderness.     Hernia: No hernia is present.     Comments: Somewhat diffuse tenderness, but worse left abdomen.  No hernia.  No rebound or guarding.  Genitourinary:    Scrotum/Testes:        Right: Tenderness not present.        Left: Tenderness not present.  Skin:    General: Skin is warm.     Capillary Refill: Capillary refill takes less than 2 seconds.  Neurological:     Mental Status: He is alert and oriented to person, place, and time.      ED Treatments / Results  Labs (all labs ordered are listed, but only abnormal results are displayed) Labs Reviewed  COMPREHENSIVE METABOLIC PANEL - Abnormal; Notable for the following components:      Result Value   BUN 24 (*)    All other components within normal limits  URINALYSIS, ROUTINE W REFLEX MICROSCOPIC - Abnormal; Notable for the following components:   APPearance HAZY (*)    All other components within normal limits  LIPASE, BLOOD  CBC    EKG None  Radiology Ct Renal Stone Study  Result Date: 10/20/2018 CLINICAL DATA:  Left flank/lower quadrant pain with dysuria EXAM: CT ABDOMEN AND PELVIS WITHOUT CONTRAST TECHNIQUE: Multidetector CT imaging of the abdomen and pelvis was performed following the standard protocol without oral or IV contrast. COMPARISON:  March 09, 2014 FINDINGS: Lower chest: Lung bases are clear. Hepatobiliary: No focal liver lesions are apparent on this noncontrast  enhanced study. Gallbladder wall is not appreciably thickened. There is no biliary duct dilatation. Pancreas: There is no pancreatic mass or inflammatory focus. Spleen: No splenic lesions are evident. Adrenals/Urinary Tract: Adrenals bilaterally appear normal. There is a cyst arising from the upper pole the right kidney measuring 2.8 x 2.6 cm. There is a cyst arising more laterally from the mid right kidney measuring 1.5 x 1.5 cm. There is a cyst arising from the posterior upper to mid left kidney measuring 1.8 x 1.5 cm. There is a 1.0 x 0.8 cm cyst more anteriorly arising from the upper to mid left kidney. There is a 5 mm probable cyst more laterally on the left, stable. There is no appreciable hydronephrosis on either side. There is no evident renal or ureteral calculus on either side. Urinary  bladder wall thickness is within normal limits. Stomach/Bowel: There is no appreciable bowel wall or mesenteric thickening. There is no evident bowel obstruction. Terminal ileum appears unremarkable. There is no free air or portal venous air. Vascular/Lymphatic: There is no abdominal aortic aneurysm. No vascular lesions are evident on this noncontrast enhanced study. There is no adenopathy in the abdomen or pelvis. Reproductive: Prostate and seminal vesicles are normal in size and contour. There is no evident pelvic mass. Other: Appendix appears normal. No abscess or ascites is evident in the abdomen or pelvis. Musculoskeletal: There are foci of degenerative change in the lower thoracic and lumbar regions. There are no blastic or lytic bone lesions. There is no intramuscular or abdominal wall lesions. IMPRESSION: 1. A cause for patient's symptoms has not been established with this study. 2. No renal or ureteral calculi. No hydronephrosis evident. Urinary bladder wall thickness within normal limits. 3. No bowel obstruction. No abscess in the abdomen or pelvis. Appendix appears normal. Electronically Signed   By: Bretta BangWilliam   Woodruff III M.D.   On: 10/20/2018 19:02    Procedures Procedures (including critical care time)  Medications Ordered in ED Medications  sodium chloride flush (NS) 0.9 % injection 3 mL (3 mLs Intravenous Given 10/20/18 1752)  ketorolac (TORADOL) 30 MG/ML injection 15 mg (15 mg Intravenous Given 10/20/18 1747)     Initial Impression / Assessment and Plan / ED Course  I have reviewed the triage vital signs and the nursing notes.  Pertinent labs & imaging results that were available during my care of the patient were reviewed by me and considered in my medical decision making (see chart for details).        Patient with abdominal pain.  CT scan reassuring.  Urine reassuring.  Felt like previous stones but no stones visible.  Discharge home with outpatient follow-up.  Pain medicines given.  Final Clinical Impressions(s) / ED Diagnoses   Final diagnoses:  Left flank pain    ED Discharge Orders         Ordered    predniSONE (DELTASONE) 20 MG tablet  Daily     10/20/18 1921    HYDROcodone-acetaminophen (NORCO/VICODIN) 5-325 MG tablet  Every 6 hours PRN     10/20/18 Letitia [id number]           Zorina Mallin, MD 10/20/18 2323

## 2018-10-20 NOTE — ED Notes (Signed)
Through interpretor. Pt states pain at left lower back radiating to left groin. POain is 6/10. Pt states started yesterday morning. Some increased pain with urination. Hx of kidney stones in 1992 and 1996.

## 2018-10-20 NOTE — ED Notes (Signed)
Discharge instructions and prescriptions discussed with Pt. Pt verbalized understanding. Pt stable and ambulatory.   

## 2018-10-20 NOTE — ED Notes (Signed)
Patient ambulated to and from bathroom with steady gait.

## 2018-10-20 NOTE — ED Notes (Signed)
Patient transported to CT 

## 2018-10-22 ENCOUNTER — Ambulatory Visit: Payer: Self-pay

## 2018-12-21 ENCOUNTER — Ambulatory Visit (HOSPITAL_COMMUNITY)
Admission: RE | Admit: 2018-12-21 | Discharge: 2018-12-21 | Disposition: A | Discharge status: Home / Self Care | Payer: Self-pay | Source: Ambulatory Visit | Attending: Internal Medicine | Admitting: Internal Medicine

## 2018-12-21 ENCOUNTER — Ambulatory Visit (INDEPENDENT_AMBULATORY_CARE_PROVIDER_SITE_OTHER): Payer: Self-pay | Admitting: Internal Medicine

## 2018-12-21 ENCOUNTER — Encounter: Payer: Self-pay | Admitting: Internal Medicine

## 2018-12-21 ENCOUNTER — Other Ambulatory Visit: Payer: Self-pay

## 2018-12-21 VITALS — BP 120/77 | HR 83 | Temp 98.1°F | Wt 224.1 lb

## 2018-12-21 DIAGNOSIS — R1084 Generalized abdominal pain: Secondary | ICD-10-CM

## 2018-12-21 DIAGNOSIS — I110 Hypertensive heart disease with heart failure: Secondary | ICD-10-CM

## 2018-12-21 DIAGNOSIS — R51 Headache: Secondary | ICD-10-CM

## 2018-12-21 DIAGNOSIS — R42 Dizziness and giddiness: Secondary | ICD-10-CM

## 2018-12-21 DIAGNOSIS — M79641 Pain in right hand: Secondary | ICD-10-CM | POA: Insufficient documentation

## 2018-12-21 DIAGNOSIS — R0789 Other chest pain: Secondary | ICD-10-CM | POA: Insufficient documentation

## 2018-12-21 DIAGNOSIS — R079 Chest pain, unspecified: Secondary | ICD-10-CM

## 2018-12-21 DIAGNOSIS — I5030 Unspecified diastolic (congestive) heart failure: Secondary | ICD-10-CM

## 2018-12-21 DIAGNOSIS — Z9889 Other specified postprocedural states: Secondary | ICD-10-CM

## 2018-12-21 DIAGNOSIS — Z8719 Personal history of other diseases of the digestive system: Secondary | ICD-10-CM

## 2018-12-21 DIAGNOSIS — M79642 Pain in left hand: Secondary | ICD-10-CM

## 2018-12-21 NOTE — Progress Notes (Signed)
CC: Abdominal pain, chest pain, hand pain, headache  HPI:  [name] is a 53 y.o. male with a PMHx of HFpEF, HTN, upper GI bleed 2/2 to NSAID use, who presents to the clinic for evaluation of abdominal pain, chest pain, hand pain and headache.   Phillip [name] reports 1 to 2 days of right-sided periorbital headache that feels like squeezing tight.  He denies radiation of this right-sided head pain, nor any left-sided headache.  Pain is not exacerbated with eye movements.  He notes that he did have corneal surgery before that had caused him to become significantly photophobic requiring a eye patch.  He has not seen his ophthalmologist in 5 years due to lack of health insurance and financial means.  Phillip [name] reports that around the same time his headache started, he also began to have generalized abdominal pain with radiation to his chest.  This combined abdominal pain and chest pain is exacerbated by breathing and exertion.  Alleviating factors includes rest and trying to stay calm.  He endorses chronic shortness of breath that he feels is due to his weight gain.  He denies radiation of chest pain into his arms, leg swelling, nausea, vomiting, diarrhea, melena, hematochezia, urinary changes.  He has not been taking any new medications and is compliant with his prescribed medications.  Since the onset of symptoms, he has not been eating and drinking as he did previously and has been having dizziness that is worse from lying to standing.  Due to this dizziness, he has not been able to go to work.  Patient also notes he has been having bilateral hand pain for the past 3 to 4 days that is worse in the morning.  He he states that in addition to the pain, he has significant stiffness that takes 2 to 3 hours to resolve.  He notes a distant history of something similar but knows this is the first time this hand pain has happened since.  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  He denies any joint swelling or redness in his hands.  He notes that his bilateral knees also hurt similarly but not as bad.  He notes increased joint cracking in his knees especially.  Denies other joint pain, fever, chills, numbness or tingling  Past Medical History:  Diagnosis Date  . Cardiac insufficiency (HCC)   . CHF (congestive heart failure) (HCC)   . Chronic back pain    "neck down" (07/20/2013)  . Chronic systolic heart failure (HCC) 06/15/2014  . GERD (gastroesophageal reflux disease)   . High cholesterol   . Hypertension   . Renal calculus    Social History:  Denies tobacco, alcohol or drug use.   Review of Systems:  Review of Systems  Constitutional: Positive for malaise/fatigue. Negative for chills, fever and weight loss.  HENT: Negative for congestion and sore throat.   Eyes: Positive for photophobia (chronic, right eye).  Respiratory: Positive for shortness of breath (chronic). Negative for cough.   Cardiovascular: Positive for chest pain. Negative for leg swelling.  Gastrointestinal: Positive for abdominal pain. Negative for blood in stool, constipation, diarrhea, heartburn, melena, nausea and vomiting.  Genitourinary: Negative for dysuria, frequency, hematuria and urgency.  Musculoskeletal: Positive for joint pain.  Skin: Negative for itching and rash.  Neurological: Positive for dizziness and headaches. Negative for tingling and focal weakness.  Physical Exam:  Vitals:   12/21/18 0952  BP: 120/77  Pulse: 83  Temp: 98.1 F (36.7 C)  TempSrc: Oral  SpO2: 97%  Weight: 224 lb 1.6 oz (101.7 kg)   Physical Exam Vitals signs and nursing note reviewed.  Constitutional:      General: He is not in acute distress.    Appearance: He is obese. He is not ill-appearing, toxic-appearing or diaphoretic.  HENT:     Head: Normocephalic and atraumatic.  Eyes:     General: No scleral  icterus.    Extraocular Movements: Extraocular movements intact.     Comments: Left pupil round and reactive. Right eye hx of corneal surgery with complications. Wears patch to cover right eye due to photophobia  Cardiovascular:     Rate and Rhythm: Normal rate and regular rhythm.     Heart sounds: Normal heart sounds. No murmur. No gallop.   Pulmonary:     Effort: Pulmonary effort is normal. No respiratory distress.     Breath sounds: No wheezing, rhonchi or rales.  Chest:     Chest wall: No tenderness.  Abdominal:     General: Bowel sounds are normal. There is no distension. There are no signs of injury.     Palpations: Abdomen is soft.     Tenderness: There is generalized abdominal tenderness. There is no guarding.  Musculoskeletal:     Right wrist: Normal.     Left wrist: Normal.     Comments: Bilateral DIP and PIP joints, fingers 1-4, with significant pain to palpation. DIP joints worse than PIP. No joint swelling or redness. Some increased warmth bilaterally. ROM decreased secondary to pain.  Skin:    General: Skin is warm and dry.     Coloration: Skin is not pale.     Findings: No erythema or rash.  Neurological:     General: No focal deficit present.     Mental Status: He is alert and oriented to person, place, and time.     Motor: No weakness.  Psychiatric:        Mood and Affect: Mood normal.        Behavior: Behavior normal.    Assessment & Plan:   See Encounters Tab for problem based charting.  Patient seen with Dr. Lynnae January

## 2018-12-21 NOTE — Patient Instructions (Addendum)
Es probable que sus sntomas se deban a una infeccin viral que mejorar con Physiological scientist. Para el dolor, recomiendo Tylenol, tambin llamado acetaminofn. Tome 1-2 pldoras (cada una de 325 mg) cada 6 horas, segn sea necesario para Conservation officer, historic buildings. No tome ms de 8 pastillas al da.  Te llamar y dejar un mensaje de voz con tus resultados o te enviar un Catering manager.  Si tiene alguna pregunta o inquietud, llame a la clnica: [phone]

## 2018-12-21 NOTE — Progress Notes (Signed)
Internal Medicine Clinic Attending  I saw and evaluated the patient.  I personally confirmed the key portions of the history and exam documented by Dr. Basaraba and I reviewed pertinent patient test results.  The assessment, diagnosis, and plan were formulated together and I agree with the documentation in the resident's note.    

## 2018-12-21 NOTE — Assessment & Plan Note (Signed)
Due to recent malaise, patient has had decreased p.o. intake for 3 to 4 days.  During this time he has noticed acute dizziness that is consistent with orthostatic hypotension.  Strongly encouraged to increase his p.o. intake.  Patient states that prior to this acute episode, dizziness is only intermittent and mild.  Plan: - Increase p.o. intake

## 2018-12-21 NOTE — Assessment & Plan Note (Signed)
3-4 days of acute bilateral DIP and PIP joint pain and stiffness that is worst in the morning and takes 2-3 hours to resolve. 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 Physical exam notable for significant tenderness to palpation with some increased warmth but no swelling or erythema. Seems to uniformly involve all his fingers, not a couple individual ones. Given sudden onset, unlikely to be osteoarthritis. Differential includes inflammatory cause, possible secondary to viral infection, as he is having abdominal pain, chest pain and headache as well.   For further evaluation, will obtain a CRP and ESR.   Plan:  - Pain control with Tylenol  - ESR and CRP pending

## 2018-12-21 NOTE — Assessment & Plan Note (Addendum)
Generalized abdominal pain without nausea, vomiting, bowel movement changes, or urinary changes. No recent NSAID use or history concerning for GI bleed. Physical exam notable for diffuse generalized tenderness to palpation. Given that his abdominal pain is nonspecific, and he also has headache chest pain and bilateral hand pain, suspicious for a possible viral infection.  Discussed the importance of increasing p.o. intake. For further evaluation though, CBC with diff and CMP were obtained to ensure we are not missing something more sinister.   Plan: - Drink plenty of fluids - CBC with diff and CMP pending

## 2018-12-21 NOTE — Assessment & Plan Note (Signed)
Per HPI, there are some aspects of patient's history that are consistent with typical chest pain concerning for ACS such as worsening with exertion and improvement with rest.  However, chest pain does come in combination with abdominal pain and headache.  No prior history of cardiac ischemia.    EKG was obtained and showed regular sinus rhythm, normal axis, with no ST elevations or depressions.  This is unlikely an ACS event and so will not pursue further with troponin.   Most likely secondary to possible viral infection that would explain his diffuse pain.  Recommended Tylenol for pain control, increased liquid intake and rest.  Plan: - Conservative measures for symptom relief

## 2018-12-22 LAB — CBC WITH DIFFERENTIAL/PLATELET
Basophils Absolute: 0.1 10*3/uL (ref 0.0–0.2)
Basos: 1 %
EOS (ABSOLUTE): 0.3 10*3/uL (ref 0.0–0.4)
Eos: 4 %
Hematocrit: 43.1 % (ref 37.5–51.0)
Hemoglobin: 14.2 g/dL (ref 13.0–17.7)
Immature Grans (Abs): 0.1 10*3/uL (ref 0.0–0.1)
Immature Granulocytes: 1 %
Lymphocytes Absolute: 2.3 10*3/uL (ref 0.7–3.1)
Lymphs: 29 %
MCH: 28.9 pg (ref 26.6–33.0)
MCHC: 32.9 g/dL (ref 31.5–35.7)
MCV: 88 fL (ref 79–97)
Monocytes Absolute: 0.6 10*3/uL (ref 0.1–0.9)
Monocytes: 7 %
Neutrophils Absolute: 4.7 10*3/uL (ref 1.4–7.0)
Neutrophils: 58 %
Platelets: 329 10*3/uL (ref 150–450)
RBC: 4.91 x10E6/uL (ref 4.14–5.80)
RDW: 14.1 % (ref 11.6–15.4)
WBC: 8.1 10*3/uL (ref 3.4–10.8)

## 2018-12-22 LAB — CMP14 + ANION GAP
ALT: 24 IU/L (ref 0–44)
AST: 18 IU/L (ref 0–40)
Albumin/Globulin Ratio: 1.3 (ref 1.2–2.2)
Albumin: 4.4 g/dL (ref 3.8–4.9)
Alkaline Phosphatase: 60 IU/L (ref 39–117)
Anion Gap: 17 mmol/L (ref 10.0–18.0)
BUN/Creatinine Ratio: 22 — ABNORMAL HIGH (ref 9–20)
BUN: 20 mg/dL (ref 6–24)
Bilirubin Total: <0.2 mg/dL (ref 0.0–1.2)
CO2: 20 mmol/L (ref 20–29)
Calcium: 9.3 mg/dL (ref 8.7–10.2)
Chloride: 103 mmol/L (ref 96–106)
Creatinine, Ser: 0.91 mg/dL (ref 0.76–1.27)
GFR calc Af Amer: 109 mL/min/{1.73_m2} (ref >59)
GFR calc non Af Amer: 95 mL/min/{1.73_m2} (ref >59)
Globulin, Total: 3.3 g/dL (ref 1.5–4.5)
Glucose: 94 mg/dL (ref 65–99)
Potassium: 5 mmol/L (ref 3.5–5.2)
Sodium: 140 mmol/L (ref 134–144)
Total Protein: 7.7 g/dL (ref 6.0–8.5)

## 2018-12-22 LAB — SEDIMENTATION RATE: Sed Rate: 21 mm/hr (ref 0–30)

## 2018-12-22 LAB — C-REACTIVE PROTEIN: CRP: 2 mg/L (ref 0–10)

## 2019-03-24 ENCOUNTER — Other Ambulatory Visit: Payer: Self-pay

## 2019-03-24 ENCOUNTER — Encounter: Payer: Self-pay | Admitting: Internal Medicine

## 2019-03-24 ENCOUNTER — Ambulatory Visit (INDEPENDENT_AMBULATORY_CARE_PROVIDER_SITE_OTHER): Payer: Self-pay | Admitting: Internal Medicine

## 2019-03-24 ENCOUNTER — Inpatient Hospital Stay (HOSPITAL_COMMUNITY)
Admission: AD | Admit: 2019-03-24 | Discharge: 2019-03-27 | DRG: 149 | Disposition: A | Discharge status: Home / Self Care | Payer: Self-pay | Source: Ambulatory Visit | Attending: Internal Medicine | Admitting: Internal Medicine

## 2019-03-24 VITALS — BP 124/75 | HR 66 | Temp 97.8°F | Wt 228.5 lb

## 2019-03-24 DIAGNOSIS — H8190 Unspecified disorder of vestibular function, unspecified ear: Principal | ICD-10-CM | POA: Diagnosis present

## 2019-03-24 DIAGNOSIS — I10 Essential (primary) hypertension: Secondary | ICD-10-CM | POA: Diagnosis present

## 2019-03-24 DIAGNOSIS — T868411 Corneal transplant failure, right eye: Secondary | ICD-10-CM

## 2019-03-24 DIAGNOSIS — Z20822 Contact with and (suspected) exposure to covid-19: Secondary | ICD-10-CM | POA: Diagnosis present

## 2019-03-24 DIAGNOSIS — R42 Dizziness and giddiness: Secondary | ICD-10-CM

## 2019-03-24 DIAGNOSIS — Z8249 Family history of ischemic heart disease and other diseases of the circulatory system: Secondary | ICD-10-CM

## 2019-03-24 DIAGNOSIS — I110 Hypertensive heart disease with heart failure: Secondary | ICD-10-CM | POA: Diagnosis present

## 2019-03-24 DIAGNOSIS — E785 Hyperlipidemia, unspecified: Secondary | ICD-10-CM | POA: Diagnosis present

## 2019-03-24 DIAGNOSIS — F40240 Claustrophobia: Secondary | ICD-10-CM | POA: Diagnosis present

## 2019-03-24 DIAGNOSIS — Z823 Family history of stroke: Secondary | ICD-10-CM

## 2019-03-24 DIAGNOSIS — Z87442 Personal history of urinary calculi: Secondary | ICD-10-CM

## 2019-03-24 DIAGNOSIS — E7800 Pure hypercholesterolemia, unspecified: Secondary | ICD-10-CM | POA: Diagnosis present

## 2019-03-24 DIAGNOSIS — R270 Ataxia, unspecified: Secondary | ICD-10-CM | POA: Diagnosis present

## 2019-03-24 DIAGNOSIS — Z79899 Other long term (current) drug therapy: Secondary | ICD-10-CM

## 2019-03-24 DIAGNOSIS — K219 Gastro-esophageal reflux disease without esophagitis: Secondary | ICD-10-CM | POA: Diagnosis present

## 2019-03-24 DIAGNOSIS — I5020 Unspecified systolic (congestive) heart failure: Secondary | ICD-10-CM

## 2019-03-24 DIAGNOSIS — H5461 Unqualified visual loss, right eye, normal vision left eye: Secondary | ICD-10-CM | POA: Diagnosis present

## 2019-03-24 DIAGNOSIS — I5022 Chronic systolic (congestive) heart failure: Secondary | ICD-10-CM | POA: Diagnosis present

## 2019-03-24 LAB — COMPREHENSIVE METABOLIC PANEL
ALT: 27 U/L (ref 0–44)
AST: 20 U/L (ref 15–41)
Albumin: 4 g/dL (ref 3.5–5.0)
Alkaline Phosphatase: 46 U/L (ref 38–126)
Anion gap: 8 (ref 5–15)
BUN: 29 mg/dL — ABNORMAL HIGH (ref 6–20)
CO2: 25 mmol/L (ref 22–32)
Calcium: 9.3 mg/dL (ref 8.9–10.3)
Chloride: 104 mmol/L (ref 98–111)
Creatinine, Ser: 1.2 mg/dL (ref 0.61–1.24)
GFR calc Af Amer: >60 mL/min (ref >60)
GFR calc non Af Amer: >60 mL/min (ref >60)
Glucose, Bld: 91 mg/dL (ref 70–99)
Potassium: 4.5 mmol/L (ref 3.5–5.1)
Sodium: 137 mmol/L (ref 135–145)
Total Bilirubin: 0.3 mg/dL (ref 0.3–1.2)
Total Protein: 7.9 g/dL (ref 6.5–8.1)

## 2019-03-24 LAB — CBC
HCT: 41.8 % (ref 39.0–52.0)
Hemoglobin: 13.7 g/dL (ref 13.0–17.0)
MCH: 28.9 pg (ref 26.0–34.0)
MCHC: 32.8 g/dL (ref 30.0–36.0)
MCV: 88.2 fL (ref 80.0–100.0)
Platelets: 310 10*3/uL (ref 150–400)
RBC: 4.74 MIL/uL (ref 4.22–5.81)
RDW: 13.9 % (ref 11.5–15.5)
WBC: 8.6 10*3/uL (ref 4.0–10.5)
nRBC: 0 % (ref 0.0–0.2)

## 2019-03-24 LAB — HIV ANTIBODY (ROUTINE TESTING W REFLEX): HIV Screen 4th Generation wRfx: NONREACTIVE

## 2019-03-24 LAB — SARS CORONAVIRUS 2 (TAT 6-24 HRS): SARS Coronavirus 2: NEGATIVE

## 2019-03-24 MED ORDER — ACETAMINOPHEN 650 MG RE SUPP: 650.0000 mg | Freq: Four times a day (QID) | RECTAL | Status: DC | PRN

## 2019-03-24 MED ORDER — ACETAMINOPHEN 325 MG PO TABS: 650.0000 mg | ORAL_TABLET | Freq: Four times a day (QID) | ORAL | Status: DC | PRN

## 2019-03-24 MED ORDER — POLYETHYLENE GLYCOL 3350 17 G PO PACK: 17.0000 g | PACK | Freq: Every day | ORAL | Status: DC | PRN

## 2019-03-24 MED ORDER — ENALAPRIL MALEATE 20 MG PO TABS
20.0000 mg | ORAL_TABLET | Freq: Two times a day (BID) | ORAL | Status: DC
  Administered 2019-03-25 – 2019-03-27 (×4): 20 mg via ORAL
  Filled 2019-03-24 (×8): qty 1

## 2019-03-24 MED ORDER — MECLIZINE HCL 25 MG PO TABS
25.0000 mg | ORAL_TABLET | Freq: Two times a day (BID) | ORAL | Status: DC
  Administered 2019-03-24 – 2019-03-25 (×3): 25 mg via ORAL
  Filled 2019-03-24 (×3): qty 1

## 2019-03-24 MED ORDER — SPIRONOLACTONE 25 MG PO TABS
25.0000 mg | ORAL_TABLET | Freq: Every day | ORAL | Status: DC
  Administered 2019-03-25 – 2019-03-26 (×2): 25 mg via ORAL
  Filled 2019-03-24 (×3): qty 1

## 2019-03-24 MED ORDER — ENALAPRIL MALEATE 20 MG PO TABS: 20.0000 mg | ORAL_TABLET | Freq: Two times a day (BID) | ORAL | Status: DC

## 2019-03-24 MED ORDER — SPIRONOLACTONE 25 MG PO TABS: 25.0000 mg | ORAL_TABLET | Freq: Every day | ORAL | Status: DC

## 2019-03-24 MED ORDER — ENOXAPARIN SODIUM 40 MG/0.4ML ~~LOC~~ SOLN
40.0000 mg | SUBCUTANEOUS | Status: DC
  Administered 2019-03-24 – 2019-03-26 (×3): 40 mg via SUBCUTANEOUS
  Filled 2019-03-24 (×3): qty 0.4

## 2019-03-24 MED ORDER — SODIUM CHLORIDE 0.9% FLUSH
3.0000 mL | Freq: Two times a day (BID) | INTRAVENOUS | Status: DC
  Administered 2019-03-24 – 2019-03-27 (×5): 3 mL via INTRAVENOUS

## 2019-03-24 NOTE — Progress Notes (Signed)
CC: dizziness  HPI:  [name] is a 53 y.o. male with PMH below.  Today we will address dizziness  Please see A&P for status of the patient's chronic medical conditions  Past Medical History:  Diagnosis Date  . Cardiac insufficiency (HCC)   . CHF (congestive heart failure) (HCC)   . Chronic back pain    "neck down" (07/20/2013)  . Chronic systolic heart failure (HCC) 06/15/2014  . GERD (gastroesophageal reflux disease)   . High cholesterol   . Hypertension   . Renal calculus   . Toxic effect of chemicals 07/10/2018   Review of Systems:  ROS: Pulmonary: pt denies increased work of breathing, shortness of breath,  Cardiac: pt denies palpitations, chest pain,  Abdominal: pt denies abdominal pain, nausea, vomiting, or diarrhea   Physical Exam:  Vitals:   03/24/19 1437 03/24/19 1440 03/24/19 1441 03/24/19 1442  BP: 132/66 124/72 127/73 128/65  Pulse: 72 70 69 69  Temp: 97.8 F (36.6 C)     TempSrc: Oral     SpO2: 100%     Weight: 228 lb 8 oz (103.6 kg)      Cardiac:  normal rate and rhythm, clear s1 and s2, no murmurs, rubs or gallops Pulmonary: CTAB, not in distress Neuro: II:  left eye visual fields intact;  III,IV, VI: Ptosis not present, EOMI but slow, difficult to evaluate for nystagmus given poor vision and slow tracking V,VII: No facial droop. Left facial sensation to light touch diminished compared with right VIII: hearing intact to voice IX,X: Palate rises symmetrically XI: Symmetric shoulder shrug XII: midline tongue extension Motor: RUE and RLE 5/5 LUE 5/5 LLE 5/5 Sensory: Temp and light touch intact x 4 extremities Cerebellar: normal HS, abnormal finger to nose but difficult exam due to poor vision Gait: walks a short distance becomes very dizzy and grabs for the wall     Social History   Socioeconomic History  . Marital status: Married    Spouse name: Not on file  . Number of children: Not on file  . Years of education: Not on  file  . Highest education level: Not on file  Occupational History  . Not on file  Tobacco Use  . Smoking status: Never Smoker  . Smokeless tobacco: Never Used  Substance and Sexual Activity  . Alcohol use: No    Alcohol/week: 0.0 standard drinks  . Drug use: No  . Sexual activity: Yes  Other Topics Concern  . Not on file  Social History Narrative  . Not on file   Social Determinants of Health   Financial Resource Strain:   . Difficulty of Paying Living Expenses: Not on file  Food Insecurity:   . Worried About Programme researcher, broadcasting/film/video in the Last Year: Not on file  . Ran Out of Food in the Last Year: Not on file  Transportation Needs:   . Lack of Transportation (Medical): Not on file  . Lack of Transportation (Non-Medical): Not on file  Physical Activity:   . Days of Exercise per Week: Not on file  . Minutes of Exercise per Session: Not on file  Stress:   . Feeling of Stress : Not on file  Social Connections:   . Frequency of Communication with Friends and Family: Not on file  . Frequency of Social Gatherings with Friends and Family: Not on file  . Attends Religious Services: Not on file  . Active Member of Clubs or Organizations: Not on file  .  Attends Archivist Meetings: Not on file  . Marital Status: Not on file  Intimate Partner Violence:   . Fear of Current or Ex-Partner: Not on file  . Emotionally Abused: Not on file  . Physically Abused: Not on file  . Sexually Abused: Not on file    No family history on file.  Assessment & Plan:   See Encounters Tab for problem based charting.  Patient discussed with Dr. Evette Doffing

## 2019-03-24 NOTE — Progress Notes (Signed)
Internal Medicine Clinic Attending  I saw and evaluated the patient.  I personally confirmed the key portions of the history and exam documented by Dr. Shan Levans and I reviewed pertinent patient test results.  The assessment, diagnosis, and plan were formulated together and I agree with the documentation in the resident's note.  53 year old person with hypertension here with 2 days of acute vestibular syndrome. He has had intermittent problems with dizziness over the last few years, but this episode has been more intense and longer lasting that prior episodes. He has an abnormal neuro exam today, with abnormal finger to nose exam and gait ataxia. He is only able to walk a few feet before listing leftwards into the wall. As such, I cannot rule out a central cause of this vestibular syndrome without further testing. We offered him admission to the hospital for stroke evaluation (which may prove challenging given a severe claustrophobia). Would manage his symptoms with meclizine and see if we can pre-medicate him adequately to obtain an MRI to rule out a posterior circulation CVA.

## 2019-03-24 NOTE — H&P (Signed)
Date: 03/24/2019               Patient Name:  JONG RICKMAN MRN: [id number]  DOB: 21-Jul-1965 Age / Sex: 53 y.o., male   PCP: Velna Ochs, MD         Medical Service: Internal Medicine Teaching Service         Attending Physician: Dr. Lucious Groves, DO    First Contact: Dr. Charleen Kirks Pager: [phone]  Second Contact: Dr. Koleen Distance  Pager: [phone]       After Hours (After 5p/  First Contact Pager: [phone]  weekends / holidays): Second Contact Pager: [phone]   Chief Complaint: Dizziness   History of Present Illness:  Mr. [name] is a 53yo male with PMH HTN, HLD, GERD, and HFrEF presenting with new onset dizziness.   Patient states that dizziness has been ongoing for several months now but in the last few weeks it has become more frequent with up to 2-3 episodes per week.  Since Monday (2 days ago), dizziness has been occurring several times a day.  Today, he had a sudden onset fainting episode without complete loss of consciousness and only symptom prior was dizziness.  No head trauma as his coworker caught him.  He has complete memory from before and after event.  No prior fainting episodes before.  Patient endorses occasional nausea and "bizarre vision," and mild headache with dizziness but denies hearing changes, ringing in his ears, chest pain, palpitations, vomiting, abdominal pain.  During dizzy episodes, he does have difficulty walking due to the dizziness.  Patient states that approximately 15 to 20 days ago, he had sudden onset generalized weakness that is worse on the right than the left of both his upper and lower extremities.  Since onset, symptoms have not changed at all.  He also has numbness in his fingers bilaterally in the morning that resolves throughout the day.  This has been happening for a while.  Patient notes intermittent shortness of breath with increased activity and while sleeping.  He notes he has never been able to sleep flat and  always has to use 2 pillows.  Endorses increased weight gain in the past several months.  No urinary or bowel movement changes.  No night sweats.  No changes in appetite or fluid intake.  Meds:  No current facility-administered medications on file prior to encounter.   Current Outpatient Medications on File Prior to Encounter  Medication Sig Dispense Refill  . carvedilol (COREG) 25 MG tablet Take 0.5 tablets (12.5 mg total) by mouth 2 (two) times daily with a meal. HF program 30 tablet 11  . enalapril (VASOTEC) 20 MG tablet Take 1 tablet (20 mg total) by mouth 2 (two) times daily. HF program 60 tablet 11  . spironolactone (ALDACTONE) 25 MG tablet Take 1 tablet (25 mg total) by mouth daily. HF program 30 tablet 11   Allergies: Allergies as of 03/24/2019  . (No Known Allergies)   Past Medical History:  Diagnosis Date  . Cardiac insufficiency ([place])   . CHF (congestive heart failure) ([place])   . Chronic back pain    "neck down" (07/20/2013)  . Chronic systolic heart failure ([place]) 06/15/2014  . GERD (gastroesophageal reflux disease)   . High cholesterol   . Hypertension   . Renal calculus   . Toxic effect of chemicals 07/10/2018    Family History:  HTN in multiple family members Stroke - Father   Social History:  Denies alcohol, drug  and tobacco use.   Review of Systems: A complete ROS was negative except as per HPI.   Physical Exam: Blood pressure 133/84, pulse 62, temperature 98 F (36.7 C), temperature source Oral, resp. rate 18, height 5\' 9"  (1.753 m), weight 101.2 kg, SpO2 99 %.  Constitution: mild distress, obese, alert and cooperative HENT: [place]/AT Eyes: eom intact although delayed, +nystagmus, PEERLA,  Cardio: RRR, no m/r/g  Respiratory: CTA, non-labored breathing Abdominal: soft, non-distended, minimally tender peri-umbilical MSK: UE and LE strength 5/5 and symmetric.  Neuro: CN II-XII intact with decreased touch on right of face and lower extremities. AOx3,  Skin:  c/d/i   Assessment & Plan by Problem: Active Problems:   Acute vestibular syndrome  Mr. is a 53yo male with PMH HTN, HLD, GERD, and HFrEF presenting with new onset dizziness that began two days ago.   Acute Vestibular Syndrome Two days of acute onset dizziness although previous intermittent episodes as well and condition appears somewhat chronic with previous episodes of HA & dizziness. Orthostatics normal, no metabolic component, recent HIV negative. Ataxic w/positive dysmetria on exam and nystagmus, no changes in hearing or tinnitis, Meniere's less likely. 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 Ideally would perform MRI to rule out posterior stroke but due to patient's severe claustrophobia will need full sedation. Last a1c this month 5.6.   - MRI with sedation - lipid panel, TSH, B12 - meclizine prn  - PT/OT - vestibular PT  - EKG pending  Systolic Heart Failure HTN Most recent echo with EF 60-65%, previous 2017 EF 50%. Currently normotensive.  - cont. Home coreg 12.5 mg bid  - cont. spironolactone 25 mg qd and enalapril 20 mg bid  Dispo: Admit patient to Inpatient with expected length of stay greater than 2 midnights.  Signed: Dr. April 22 Internal Medicine PGY-1  Pager: [phone] 03/24/2019, 8:18 PM

## 2019-03-24 NOTE — Assessment & Plan Note (Addendum)
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  He was removing the top of a table felt dizzy like  and his coworker had to hold him up.  He did not lose consciousness.  He began to black out vision wise and felt like he was losing consciousness.  He has been feeling dizzy for the last 3 days.  He noticed when he had to stand up quickly at work he got very dizzy, additionally this happened in the shower when he was first getting in.  The dizziness normally corrects very quickly within a minute of laying horizontal.  This usually occurs about 3 times per months.  He has had no changes to his bp medications he took them all today.  He drinks mostly coke and orange juice he reports decreased water intake.  The episode today he has had persistent dizziness and this is why he came in today.  He has not had any palpitations or felt like his heart racing.  He denies chest pain, he has periodic shortness of breath.  He is unsure if he has had any fever but has not noted chills.  No known sick contacts.  Many of his symptoms are similar to prior episodes however the intensity seems more severe today.  His examination is difficult due to his vision problems, dizziness with movement and using an interpreter. His orthostatic vital signs are negative.  Postural changes reproduce his symptoms.    -admission for further workup, he has sever claustrophobia and even mentioning an MRI elicited a panic attack.   -symptomatic management with meclizine

## 2019-03-24 NOTE — Progress Notes (Signed)
Pt and his visiting son state that  he does not take Carvedilol anymore. They claim he takes Hydrochlorothiazide, Enalapril, and Spirinolactone. Son was asked to bring pill bottles tomorrow.    Interpreter was used to verify the medicines he needs to take tonight. Lovenox injection and Meclizine explained. Pt states he has not taken his evening dose of Enalapril.

## 2019-03-25 LAB — LIPID PANEL
Cholesterol: 173 mg/dL (ref 0–200)
HDL: 28 mg/dL — ABNORMAL LOW (ref >40)
LDL Cholesterol: 82 mg/dL (ref 0–99)
Total CHOL/HDL Ratio: 6.2 RATIO
Triglycerides: 313 mg/dL — ABNORMAL HIGH (ref <150)
VLDL: 63 mg/dL — ABNORMAL HIGH (ref 0–40)

## 2019-03-25 LAB — T4, FREE: Free T4: 0.74 ng/dL (ref 0.61–1.12)

## 2019-03-25 LAB — CBC
HCT: 41.5 % (ref 39.0–52.0)
Hemoglobin: 13.9 g/dL (ref 13.0–17.0)
MCH: 29.4 pg (ref 26.0–34.0)
MCHC: 33.5 g/dL (ref 30.0–36.0)
MCV: 87.7 fL (ref 80.0–100.0)
Platelets: 298 10*3/uL (ref 150–400)
RBC: 4.73 MIL/uL (ref 4.22–5.81)
RDW: 14.1 % (ref 11.5–15.5)
WBC: 6.6 10*3/uL (ref 4.0–10.5)
nRBC: 0 % (ref 0.0–0.2)

## 2019-03-25 LAB — BASIC METABOLIC PANEL
Anion gap: 8 (ref 5–15)
BUN: 24 mg/dL — ABNORMAL HIGH (ref 6–20)
CO2: 24 mmol/L (ref 22–32)
Calcium: 9.3 mg/dL (ref 8.9–10.3)
Chloride: 105 mmol/L (ref 98–111)
Creatinine, Ser: 0.97 mg/dL (ref 0.61–1.24)
GFR calc Af Amer: >60 mL/min (ref >60)
GFR calc non Af Amer: >60 mL/min (ref >60)
Glucose, Bld: 108 mg/dL — ABNORMAL HIGH (ref 70–99)
Potassium: 4.1 mmol/L (ref 3.5–5.1)
Sodium: 137 mmol/L (ref 135–145)

## 2019-03-25 LAB — VITAMIN B12: Vitamin B-12: 584 pg/mL (ref 180–914)

## 2019-03-25 LAB — TSH: TSH: 6.714 u[IU]/mL — ABNORMAL HIGH (ref 0.350–4.500)

## 2019-03-25 NOTE — Progress Notes (Addendum)
Subjective:   Pt seen at the bedside this morning. [name] endorses continued dizziness that is worsened by laying flat and improved by sitting up. Dizziness is not worsened by movement. He denies headache, nausea, changes in vision or hearing.  Discussed plan for full sedation MRI due to severe claustrophobia with panic attacks. Patient is in agreement with plan.   Due to language barrier, an interpreter, Phillip [name] interpreter, was present during the history-taking and subsequent discussion (and for part of the physical exam) with this patient.  Objective:  Vital signs in last 24 hours: Vitals:   03/24/19 2312 03/25/19 0411 03/25/19 0417 03/25/19 0802  BP: 134/74 128/77  124/73  Pulse: 66 66  66  Resp: 18 18    Temp: 97.9 F (36.6 C) 97.8 F (36.6 C)  97.7 F (36.5 C)  TempSrc: Oral Oral  Oral  SpO2: 98% 98%  97%  Weight:   100 kg   Height:       Physical Exam Vitals and nursing note reviewed.  HENT:     Head: Normocephalic and atraumatic.  Eyes:     Extraocular Movements:     Left eye: Normal extraocular motion and no nystagmus.     Conjunctiva/sclera:     Left eye: Left conjunctiva is not injected.     Comments: Right eye covered by eye patch  Cardiovascular:     Rate and Rhythm: Normal rate and regular rhythm.     Heart sounds: No murmur.  Pulmonary:     Effort: Pulmonary effort is normal. No respiratory distress.     Breath sounds: Normal breath sounds. No wheezing or rales.  Abdominal:     General: Bowel sounds are normal. There is no distension.     Palpations: Abdomen is soft.     Tenderness: There is no abdominal tenderness. There is no guarding.  Musculoskeletal:     Right lower leg: No edema.     Left lower leg: No edema.  Skin:    General: Skin is warm and dry.  Neurological:     General: No focal deficit present.     Mental Status: He is alert and oriented to person, place, and time. Mental status is at baseline.     Cranial Nerves: No  cranial nerve deficit.     Motor: No weakness.     Coordination: Coordination normal. Finger-Nose-Finger Test and Heel to Shin Test normal.  Psychiatric:        Mood and Affect: Mood normal.        Behavior: Behavior normal.    Assessment/Plan:  Active Problems:   Acute vestibular syndrome   [name] is a 53yo male with PMH HTN, HLD, GERD, and HFrEF presenting with new onset dizziness that began two days ago.   # Acute Vestibular Syndrome Continued dizziness today that is present lying down. Not worsened by movement so not consistent with BPPV. No nystagmus today on examination. Unlikely meniere's. Will continue plan to rule out stroke.   - MRI with sedation - Meclizine prn  - PT/OT - vestibular PT   # Systolic Heart Failure # HTN Most recent echo with EF 60-65%, previous 2017 EF 50%. BP well controlled during this admission. Patient states that he also takes HCTZ in addition to spironolactone and enalapril, however, this is not documented anywhere per chart review, so will hold that until verified. In addition, patient reports he is no longer taking Coreg.  - cont. spironolactone 25 mg qd and  enalapril 20 mg bid   Dispo: Anticipated discharge pending clinical improvement.   Dr. Verdene Lennert Internal Medicine PGY-1  Pager: [phone] 03/25/2019, 9:00 AM

## 2019-03-25 NOTE — Evaluation (Signed)
Occupational Therapy Evaluation Patient Details Name: Phillip [name] MRN: [id number] DOB: 11/30/1965 Today's Date: 03/25/2019    History of Present Illness [name] is a 53yo male with PMH HTN, HLD, GERD, and HFrEF presenting with new onset dizziness that began two days ago.   Clinical Impression   Pt PTA: Pt was working and performing ADL and mobility with independence. Pt currently performing ADL functional tasks with no physical assist and ADL tasks with no difficulty. Pt reports no dizziness. Used [place] interpreter. Pt with slight focal deficits in LUE for coordination. Visual scanning, WFLs and sensation is intact. Pt does not require continued OT. OT signing off.    Follow Up Recommendations  No OT follow up    Equipment Recommendations  None recommended by OT    Recommendations for Other Services       Precautions / Restrictions Precautions Precautions: Fall      Mobility Bed Mobility Overal bed mobility: Modified Independent                Transfers Overall transfer level: Modified independent                    Balance Overall balance assessment: Needs assistance   Sitting balance-Leahy Scale: Good       Standing balance-Leahy Scale: Good Standing balance comment: able to balance in SLS at least 5 sec bilateral, tandem gait with S arms out to side x 6'                           ADL either performed or assessed with clinical judgement   ADL Overall ADL's : Modified independent                                     Functional mobility during ADLs: Modified independent General ADL Comments: No physical assist required for ADL and mobility.     Vision Baseline Vision/History: No visual deficits Vision Assessment?: No apparent visual deficits     Perception     Praxis      Pertinent Vitals/Pain Pain Assessment: No/denies pain     Hand Dominance Right   Extremity/Trunk Assessment Upper  Extremity Assessment Upper Extremity Assessment: Generalized weakness;LUE deficits/detail LUE Deficits / Details: coordination deficits/ataxia LUE Coordination: decreased fine motor;decreased gross motor   Lower Extremity Assessment Lower Extremity Assessment: Defer to PT evaluation RLE Deficits / Details: decreased coordination with R compared to L with heel to shin, no difference or issue with toe taps LLE Deficits / Details: AROM WFL, strength hip flexion 4/5, knee extension 4-/5, ankle DF 4-/5 LLE Sensation: (reports some numbness at times more in morning when stiff)       Communication Communication Communication: Prefers language other than English;Interpreter Phillip [name] #[id number])   Cognition Arousal/Alertness: Awake/alert Behavior During Therapy: WFL for tasks assessed/performed Overall Cognitive Status: Within Functional Limits for tasks assessed                                     General Comments  can pick up item from floor, but usually holds on    Exercises     Shoulder Instructions      Home Living Family/patient expects to be discharged to:: Private residence Living Arrangements: Spouse/significant other;Children Available Help at Discharge: Family Type of  Home: Apartment Home Access: Stairs to enter [employer] of Steps: 12 Entrance Stairs-Rails: Right Home Layout: One level     Bathroom Shower/Tub: Chief Strategy Officer: Handicapped height     Home Equipment: None          Prior Functioning/Environment Level of Independence: Independent        Comments: works in Publishing copy, does not drive        OT Problem List: Decreased coordination      OT Treatment/Interventions:      OT Goals(Current goals can be found in the care plan section) Acute Rehab OT Goals Patient Stated Goal: to feel better; return to work  OT Frequency:     Barriers to D/C:            Co-evaluation               AM-PAC OT "6 Clicks" Daily Activity     Outcome Measure Help from another person eating meals?: None Help from another person taking care of personal grooming?: None Help from another person toileting, which includes using toliet, bedpan, or urinal?: None Help from another person bathing (including washing, rinsing, drying)?: None Help from another person to put on and taking off regular upper body clothing?: None Help from another person to put on and taking off regular lower body clothing?: None 6 Click Score: 24   End of Session Nurse Communication: Mobility status  Activity Tolerance: Patient tolerated treatment well Patient left: in bed;with call bell/phone within reach  OT Visit Diagnosis: Unsteadiness on feet (R26.81);Muscle weakness (generalized) (M62.81)                Time: 1610-9604 OT Time Calculation (min): 22 min Charges:  OT General Charges $OT Visit: 1 Visit OT Evaluation $OT Eval Moderate Complexity: 1 Mod  Phillip [name] OTR/L Acute Rehabilitation Services Pager: [phone] Office: [phone]   Phillip [name] C 03/25/2019, 5:10 PM

## 2019-03-25 NOTE — Care Management (Addendum)
Spoke w patient at bedside w [place] interpreter. He states that he lives at home w his son in [place]. He confirms that he does not have insurance. He states that he is an active patient at the IM clinic. He gets his meds at the [facility] and states that they are "really cheap." He denied needing any HH or DME services for DC.   Referral placed in Epic for vestibular PT at [facility]

## 2019-03-25 NOTE — Evaluation (Signed)
Physical Therapy Evaluation Patient Details Name: Phillip [name] MRN: [id number] DOB: Aug 16, 1965 Today's Date: 03/25/2019   History of Present Illness  [name] is a 53yo male with PMH HTN, HLD, GERD, and HFrEF presenting with new onset dizziness that began two days ago.  Clinical Impression  Patient presents with mobility limited due to continued mild dizziness and reports leaning to one side.  Though able to ambulate without physical help and demonstrating single limb stance for up to 5 seconds as well as stair negotiation, feel he will benefit from outpatient vestibular rehab to further progress balance, mobility and safety as pt works in Teacher, music with very active job.  Feel stable for home as no significant nystagmus noted, but possibly some element of R hypofunction.  Some symptoms in R modified hallpike as well to suggest positional component, but no noted nystagmus.  PT to follow up if not d/c.    Follow Up Recommendations Outpatient PT(vestibular rehab)    Equipment Recommendations  None recommended by PT    Recommendations for Other Services       Precautions / Restrictions Precautions Precautions: Fall      Mobility  Bed Mobility Overal bed mobility: Modified Independent                Transfers Overall transfer level: Modified independent                  Ambulation/Gait Ambulation/Gait assistance: Supervision Gait Distance (Feet): 200 Feet Assistive device: None Gait Pattern/deviations: Step-through pattern;Decreased stride length;Drifts right/left     General Gait Details: with head turns tends to drift towards that side, reports feels he is leaning to one side with ambulation (more to R)  Stairs Stairs: Yes Stairs assistance: Supervision;Min guard Stair Management: Forwards;One rail Left Number of Stairs: 10 General stair comments: reported negotiated with eyes closed due to depth perception difficulty from one eye  blindness  Wheelchair Mobility    Modified Rankin (Stroke Patients Only) Modified Rankin (Stroke Patients Only) Pre-Morbid Rankin Score: No significant disability Modified Rankin: Moderate disability     Balance Overall balance assessment: Needs assistance   Sitting balance-Leahy Scale: Good       Standing balance-Leahy Scale: Good Standing balance comment: able to balance in SLS at least 5 sec bilateral, tandem gait with S arms out to side x 6'                             Pertinent Vitals/Pain Pain Assessment: No/denies pain    Home Living Family/patient expects to be discharged to:: Private residence Living Arrangements: Spouse/significant other;Children Available Help at Discharge: Family Type of Home: Apartment Home Access: Stairs to enter Entrance Stairs-Rails: Right Entrance Stairs-Number of Steps: 12 Home Layout: One level Home Equipment: None      Prior Function Level of Independence: Independent         Comments: works in Teacher, music, 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        Extremity/Trunk Assessment   Upper Extremity Assessment Upper Extremity Assessment: LUE deficits/detail LUE Deficits / Details: some difficulty with eyes closed hitting nose with L hand, increased time for FNF with both due to depth perception difficulty with R eye blindness; strength WFL LUE Coordination: decreased gross motor    Lower Extremity Assessment Lower Extremity Assessment: LLE deficits/detail;RLE deficits/detail RLE Deficits / Details: decreased coordination with R compared to L with heel to shin, no difference or  issue with toe taps LLE Deficits / Details: AROM WFL, strength hip flexion 4/5, knee extension 4-/5, ankle DF 4-/5 LLE Sensation: (reports some numbness at times more in morning when stiff)       Communication   Communication: Prefers language other than [place];Interpreter utilized(Graciela present to interpret)  Cognition  Arousal/Alertness: Awake/alert Behavior During Therapy: WFL for tasks assessed/performed Overall Cognitive Status: Within Functional Limits for tasks assessed                                        General Comments General comments (skin integrity, edema, etc.): can pick up item from floor, but usually holds on  Vestibular Assessment - 03/25/19 0001      Symptom Behavior   Subjective history of current problem  reports on Monday began having more issues with dizziness, has had it intermittent in the past.  Reports feeling more light headed and passing out than true spinning.     Type of Dizziness   Lightheadedness    Frequency of Dizziness  intermittent    Duration of Dizziness  seconds    Symptom Nature  Variable;Intermittent;Spontaneous    Aggravating Factors  Supine to sit;Activity in general;Forward bending    Relieving Factors  Closing eyes    Progression of Symptoms  Better    History of similar episodes  has history of dizziness but worse now than previous      Oculomotor Exam   Oculomotor Alignment  Abnormal   patch over R eye   Ocular ROM  for L eye WNL    Spontaneous  Absent    Gaze-induced   Right beating nystagmus with R gaze    Smooth Pursuits  Intact    Saccades  Slow      Positional Testing   Sidelying Test  Sidelying Right;Sidelying Left      Sidelying Right   Sidelying Right Duration  30s    Sidelying Right Symptoms  No nystagmus   but more symptomatic in this position than other positions     Sidelying Left   Sidelying Left Duration  30 s    Sidelying Left Symptoms  No nystagmus   and no dizziness      Exercises     Assessment/Plan    PT Assessment Patient needs continued PT services  PT Problem List Decreased mobility;Decreased knowledge of use of DME;Decreased balance       PT Treatment Interventions DME instruction;Stair training;Therapeutic activities;Balance training;Patient/family education;Neuromuscular  re-education;Therapeutic exercise;Gait training;Functional mobility training    PT Goals (Current goals can be found in the Care Plan section)  Acute Rehab PT Goals Patient Stated Goal: to feel better; return to work PT Goal Formulation: With patient Time For Goal Achievement: 04/08/19 Potential to Achieve Goals: Good    Frequency Min 3X/week   Barriers to discharge        Co-evaluation               AM-PAC PT "6 Clicks" Mobility  Outcome Measure Help needed turning from your back to your side while in a flat bed without using bedrails?: None Help needed moving from lying on your back to sitting on the side of a flat bed without using bedrails?: None Help needed moving to and from a bed to a chair (including a wheelchair)?: None Help needed standing up from a chair using your arms (e.g., wheelchair or bedside chair)?:  None Help needed to walk in hospital room?: None Help needed climbing 3-5 steps with a railing? : None 6 Click Score: 24    End of Session   Activity Tolerance: Patient tolerated treatment well Patient left: in chair;with call bell/phone within reach   PT Visit Diagnosis: Other abnormalities of gait and mobility (R26.89);Dizziness and giddiness (R42)    Time: 1000-1039 PT Time Calculation (min) (ACUTE ONLY): 39 min   Charges:   PT Evaluation $PT Eval Moderate Complexity: 1 Mod PT Treatments $Gait Training: 8-22 mins $Neuromuscular Re-education: 8-22 mins        Sheran Lawless, [facility] [phone] 03/25/2019   Elray Mcgregor 03/25/2019, 4:03 PM

## 2019-03-26 ENCOUNTER — Encounter (HOSPITAL_COMMUNITY): Payer: Self-pay | Admitting: Internal Medicine

## 2019-03-26 LAB — CBC
HCT: 43.4 % (ref 39.0–52.0)
Hemoglobin: 14.4 g/dL (ref 13.0–17.0)
MCH: 29 pg (ref 26.0–34.0)
MCHC: 33.2 g/dL (ref 30.0–36.0)
MCV: 87.3 fL (ref 80.0–100.0)
Platelets: 313 10*3/uL (ref 150–400)
RBC: 4.97 MIL/uL (ref 4.22–5.81)
RDW: 13.8 % (ref 11.5–15.5)
WBC: 7 10*3/uL (ref 4.0–10.5)
nRBC: 0 % (ref 0.0–0.2)

## 2019-03-26 LAB — BASIC METABOLIC PANEL
Anion gap: 10 (ref 5–15)
BUN: 24 mg/dL — ABNORMAL HIGH (ref 6–20)
CO2: 25 mmol/L (ref 22–32)
Calcium: 9.3 mg/dL (ref 8.9–10.3)
Chloride: 102 mmol/L (ref 98–111)
Creatinine, Ser: 1.03 mg/dL (ref 0.61–1.24)
GFR calc Af Amer: >60 mL/min (ref >60)
GFR calc non Af Amer: >60 mL/min (ref >60)
Glucose, Bld: 110 mg/dL — ABNORMAL HIGH (ref 70–99)
Potassium: 4.9 mmol/L (ref 3.5–5.1)
Sodium: 137 mmol/L (ref 135–145)

## 2019-03-26 MED ORDER — MECLIZINE HCL 25 MG PO TABS: 25.0000 mg | ORAL_TABLET | Freq: Two times a day (BID) | ORAL | Status: DC | PRN

## 2019-03-26 NOTE — Progress Notes (Signed)
Physical Therapy Treatment/Discharge Patient Details Name: Phillip [name] MRN: [id number] DOB: [birth year] Today's Date: 03/26/2019    History of Present Illness [name] is a 54yo male with PMH HTN, HLD, GERD, and HFrEF presenting with new onset dizziness that began two days ago.    PT Comments    Patient today reports no further symptoms of dizziness.  Noted perfect score on Dynamic Gait Index (24/24) without symptoms of dizziness.  Feel stable for d/c without PT follow up.  Reports up in his room unaided.  Feel no further skilled PT needs.  PT to sign off.    Follow Up Recommendations  No PT follow up     Equipment Recommendations  None recommended by PT    Recommendations for Other Services       Precautions / Restrictions Precautions Precautions: None    Mobility  Bed Mobility Overal bed mobility: Independent                Transfers Overall transfer level: Independent                  Ambulation/Gait Ambulation/Gait assistance: Independent Gait Distance (Feet): 230 Feet Assistive device: None Gait Pattern/deviations: Step-through pattern;WFL(Within Functional Limits)     General Gait Details: DGI performed without issues of LOB or dizziness   Stairs Stairs: Yes Stairs assistance: Independent Stair Management: No rails;Forwards;Alternating pattern Number of Stairs: 4     Wheelchair Mobility    Modified Rankin (Stroke Patients Only) Modified Rankin (Stroke Patients Only) Pre-Morbid Rankin Score: No significant disability Modified Rankin: Slight disability     Balance                                 Standardized Balance Assessment Standardized Balance Assessment : Dynamic Gait Index   Dynamic Gait Index Level Surface: Normal Change in Gait Speed: Normal Gait with Horizontal Head Turns: Normal Gait with Vertical Head Turns: Normal Gait and Pivot Turn: Normal Step Over Obstacle: Normal Step Around  Obstacles: Normal Steps: Normal Total Score: 24      Cognition Arousal/Alertness: Awake/alert Behavior During Therapy: WFL for tasks assessed/performed Overall Cognitive Status: Within Functional Limits for tasks assessed                                        Exercises      General Comments General comments (skin integrity, edema, etc.): no issues noted with testing; utilized [employer] Video Interpreter "Shanon Brow"      Pertinent Vitals/Pain Pain Assessment: No/denies pain    Home Living                      Prior Function            PT Goals (current goals can now be found in the care plan section) Progress towards PT goals: Goals met/education completed, patient discharged from PT    Frequency    Min 3X/week      PT Plan Discharge plan needs to be updated    Co-evaluation              AM-PAC PT "6 Clicks" Mobility   Outcome Measure  Help needed turning from your back to your side while in a flat bed without using bedrails?: None Help needed moving from lying on your back to sitting  on the side of a flat bed without using bedrails?: None Help needed moving to and from a bed to a chair (including a wheelchair)?: None Help needed standing up from a chair using your arms (e.g., wheelchair or bedside chair)?: None Help needed to walk in hospital room?: None Help needed climbing 3-5 steps with a railing? : None 6 Click Score: 24    End of Session   Activity Tolerance: Patient tolerated treatment well Patient left: in bed;with nursing/sitter in room   PT Visit Diagnosis: Other abnormalities of gait and mobility (R26.89);Dizziness and giddiness (R42)     Time: 7579-7282 PT Time Calculation (min) (ACUTE ONLY): 22 min  Charges:  $Gait Training: 8-22 mins                     Magda Kiel, [facility] [phone] 03/26/2019    Phillip [name] 03/26/2019, 5:09 PM

## 2019-03-26 NOTE — Progress Notes (Addendum)
Subjective:  History and PE was obtained with the assistance of a Spanish interpreter.   Patient is doing well this AM with no dizziness. He notes that it stopped yesterday and he has had no recurrence. We discussed that we are attempting to get a MRI to rule out a CVA. This will need to be done with general anesthesia and will be scheduled for tomorrow AM at 10:30. He voices understanding and has no questions or concerns.   Objective:  Vital signs in last 24 hours: Vitals:   03/25/19 1145 03/25/19 1717 03/25/19 1955 03/26/19 0528  BP: 111/82 116/82 107/74 114/72  Pulse: 64 61 83 60  Resp: 18  18 16   Temp: 98.3 F (36.8 C) 98.2 F (36.8 C) 97.7 F (36.5 C) 97.7 F (36.5 C)  TempSrc: Oral Oral Oral Oral  SpO2: 99% 100% 97% 98%  Weight:    99.3 kg  Height:       Physical Exam Vitals and nursing note reviewed.  HENT:     Head: Normocephalic and atraumatic.  Eyes:     Extraocular Movements:     Left eye: Normal extraocular motion and no nystagmus.     Conjunctiva/sclera:     Left eye: Left conjunctiva is not injected.     Comments: Right eye covered by eye patch  Cardiovascular:     Rate and Rhythm: Normal rate and regular rhythm.     Heart sounds: No murmur.  Pulmonary:     Effort: Pulmonary effort is normal. No respiratory distress.     Breath sounds: Normal breath sounds. No wheezing or rales.  Abdominal:     General: Bowel sounds are normal. There is no distension.     Palpations: Abdomen is soft.     Tenderness: There is no abdominal tenderness. There is no guarding.  Musculoskeletal:     Right lower leg: No edema.     Left lower leg: No edema.  Skin:    General: Skin is warm and dry.  Neurological:     General: No focal deficit present.     Mental Status: He is alert and oriented to person, place, and time. Mental status is at baseline.     Cranial Nerves: No cranial nerve deficit.     Motor: No weakness.     Coordination: Coordination normal.  Finger-Nose-Finger Test and Heel to Shin Test normal.  Psychiatric:        Mood and Affect: Mood normal.        Behavior: Behavior normal.    Assessment/Plan:  Active Problems:   Acute vestibular syndrome   [name] is a 54yo male with PMH HTN, HLD, GERD, and HFrEF presenting with new onset dizziness that began two days ago. Since admission his vertigo has resolved and he is now asymptomatic.   # Acute Vestibular Syndrome Patients presenting vertigo has subsequently resolved. He has had no recurrence and is feeling back to baseline. He was able to ambulate yesterday with PT without any issues or recurrence of symptoms  - MRI with sedation planned for tomorrow  - Meclizine prn  - OT recommending no further Intervention  - PT recommending vestibular rehab as an outpatient.   # Systolic Heart Failure # HTN Most recent echo with EF 60-65%, previous 2017 EF 50%. BP well controlled during this admission. Patient states that he also takes HCTZ in addition to spironolactone and enalapril, however, this is not documented anywhere per chart review, so will hold that until verified.  In addition, patient reports he is no longer taking Coreg.  - cont. spironolactone 25 mg qd and enalapril 20 mg bid   Dispo: Anticipated discharge pending MRI.   Dr. Verdene Lennert Internal Medicine PGY-1  Pager: [phone] 03/26/2019, 7:06 AM

## 2019-03-26 NOTE — Plan of Care (Signed)

## 2019-03-27 ENCOUNTER — Inpatient Hospital Stay (HOSPITAL_COMMUNITY): Payer: Self-pay | Admitting: Certified Registered"

## 2019-03-27 ENCOUNTER — Inpatient Hospital Stay (HOSPITAL_COMMUNITY): Payer: Self-pay

## 2019-03-27 ENCOUNTER — Encounter (HOSPITAL_COMMUNITY)
Admission: AD | Disposition: A | Discharge status: Home / Self Care | Payer: Self-pay | Source: Ambulatory Visit | Attending: Internal Medicine

## 2019-03-27 HISTORY — PX: RADIOLOGY WITH ANESTHESIA: SHX6223

## 2019-03-27 SURGERY — MRI WITH ANESTHESIA
Anesthesia: General

## 2019-03-27 MED ORDER — FENTANYL CITRATE (PF) 250 MCG/5ML IJ SOLN
INTRAMUSCULAR | Status: DC | PRN
  Administered 2019-03-27: 50 ug via INTRAVENOUS

## 2019-03-27 MED ORDER — ROCURONIUM BROMIDE 50 MG/5ML IV SOSY
PREFILLED_SYRINGE | INTRAVENOUS | Status: DC | PRN
  Administered 2019-03-27: 50 mg via INTRAVENOUS

## 2019-03-27 MED ORDER — SUGAMMADEX SODIUM 200 MG/2ML IV SOLN
INTRAVENOUS | Status: DC | PRN
  Administered 2019-03-27: 200 mg via INTRAVENOUS

## 2019-03-27 MED ORDER — LACTATED RINGERS IV SOLN: INTRAVENOUS | Status: DC

## 2019-03-27 MED ORDER — ONDANSETRON HCL 4 MG/2ML IJ SOLN
INTRAMUSCULAR | Status: DC | PRN
  Administered 2019-03-27: 4 mg via INTRAVENOUS

## 2019-03-27 MED ORDER — PROPOFOL 10 MG/ML IV BOLUS
INTRAVENOUS | Status: DC | PRN
  Administered 2019-03-27: 200 mg via INTRAVENOUS

## 2019-03-27 MED ORDER — LIDOCAINE 2% (20 MG/ML) 5 ML SYRINGE
INTRAMUSCULAR | Status: DC | PRN
  Administered 2019-03-27: 60 mg via INTRAVENOUS

## 2019-03-27 MED ORDER — PHENYLEPHRINE HCL-NACL 10-0.9 MG/250ML-% IV SOLN
INTRAVENOUS | Status: DC | PRN
  Administered 2019-03-27: 30 ug/min via INTRAVENOUS

## 2019-03-27 MED ORDER — DEXAMETHASONE SODIUM PHOSPHATE 10 MG/ML IJ SOLN
INTRAMUSCULAR | Status: DC | PRN
  Administered 2019-03-27: 10 mg via INTRAVENOUS

## 2019-03-27 MED ORDER — CARVEDILOL 6.25 MG PO TABS: 6.2500 mg | ORAL_TABLET | Freq: Two times a day (BID) | ORAL | 0 refills | Status: DC

## 2019-03-27 MED ORDER — GADOBUTROL 1 MMOL/ML IV SOLN
10.0000 mL | Freq: Once | INTRAVENOUS | Status: AC | PRN
  Administered 2019-03-27: 10 mL via INTRAVENOUS

## 2019-03-27 MED ORDER — MIDAZOLAM HCL 5 MG/5ML IJ SOLN
INTRAMUSCULAR | Status: DC | PRN
  Administered 2019-03-27: 2 mg via INTRAVENOUS

## 2019-03-27 MED ORDER — CARVEDILOL 6.25 MG PO TABS
6.2500 mg | ORAL_TABLET | Freq: Two times a day (BID) | ORAL | Status: DC
  Filled 2019-03-27: qty 1

## 2019-03-27 NOTE — Transfer of Care (Signed)
Immediate Anesthesia Transfer of Care Note  Patient: Phillip [name]  Procedure(s) Performed: MRI WITH ANESTHESIA (N/A )  Patient Location: PACU  Anesthesia Type:General  Level of Consciousness: awake, alert  and oriented  Airway & Oxygen Therapy: Patient Spontanous Breathing  Post-op Assessment: Report given to RN and Post -op Vital signs reviewed and stable  Post vital signs: Reviewed and stable  Last Vitals:  Vitals Value Taken Time  BP 111/65 03/27/19 1205  Temp 36.7 C 03/27/19 1205  Pulse 68 03/27/19 1216  Resp 17 03/27/19 1216  SpO2 93 % 03/27/19 1216  Vitals shown include unvalidated device data.  Last Pain:  Vitals:   03/27/19 1205  TempSrc:   PainSc: 0-No pain         Complications: No apparent anesthesia complications

## 2019-03-27 NOTE — Anesthesia Preprocedure Evaluation (Signed)
Anesthesia Evaluation  Patient identified by MRN, date of birth, ID band Patient awake    Reviewed: Allergy & Precautions, H&P , NPO status , Patient's Chart, lab work & pertinent test results  Airway Mallampati: II   Neck ROM: full    Dental   Pulmonary neg pulmonary ROS,    breath sounds clear to auscultation       Cardiovascular hypertension,  Rhythm:regular Rate:Normal  TTE (09/2017); EF 60-65%. Normal valvular function.   Neuro/Psych  Headaches, PSYCHIATRIC DISORDERS Depression    GI/Hepatic GERD  ,  Endo/Other  obese  Renal/GU stones     Musculoskeletal   Abdominal   Peds  Hematology   Anesthesia Other Findings   Reproductive/Obstetrics                             Anesthesia Physical Anesthesia Plan  ASA: III  Anesthesia Plan: General   Post-op Pain Management:    Induction: Intravenous  PONV Risk Score and Plan: 2 and Ondansetron, Dexamethasone, Midazolam and Treatment may vary due to age or medical condition  Airway Management Planned: Oral ETT  Additional Equipment:   Intra-op Plan:   Post-operative Plan: Extubation in OR  Informed Consent: I have reviewed the patients History and Physical, chart, labs and discussed the procedure including the risks, benefits and alternatives for the proposed anesthesia with the patient or authorized representative who has indicated his/her understanding and acceptance.       Plan Discussed with: CRNA, Anesthesiologist and Surgeon  Anesthesia Plan Comments:         Anesthesia Quick Evaluation

## 2019-03-27 NOTE — Progress Notes (Signed)
Pt discharged home, transported by wheelchair to [facility].  Peripheral IV's removed, telemetry discontinued, AVS given with pt education provided to pt and son.

## 2019-03-27 NOTE — Anesthesia Procedure Notes (Signed)
Procedure Name: Intubation Date/Time: 03/27/2019 11:05 AM Performed by: Macie Burows, CRNA Pre-anesthesia Checklist: Patient identified, Emergency Drugs available, Suction available and Patient being monitored Patient Re-evaluated:Patient Re-evaluated prior to induction Oxygen Delivery Method: Circle system utilized Preoxygenation: Pre-oxygenation with 100% oxygen Induction Type: IV induction Ventilation: Mask ventilation without difficulty and Oral airway inserted - appropriate to patient size Laryngoscope Size: Glidescope and 3 Tube type: Oral Tube size: 7.5 mm Number of attempts: 1 Airway Equipment and Method: Stylet and Oral airway Placement Confirmation: ETT inserted through vocal cords under direct vision,  positive ETCO2 and breath sounds checked- equal and bilateral Secured at: 25 cm Tube secured with: Tape Dental Injury: Teeth and Oropharynx as per pre-operative assessment

## 2019-03-27 NOTE — Progress Notes (Signed)
   Subjective:  Pt seen at the bedside this morning. States he is feeling alright. Had an episode of dizziness this morning when up using the restroom. Endorses that this felt different than the dizziness that initially brought him in, "felt like I had been laying in the bed too long."   Pt aware of sedation MRI this morning. All questions and concerns addressed.  Objective:  Vital signs in last 24 hours: Vitals:   03/26/19 0813 03/26/19 1243 03/26/19 2002 03/27/19 0424  BP: 123/79 120/74 107/70 114/76  Pulse: 65 93 82 68  Resp: 20 20 18 18   Temp: 98 F (36.7 C) 98.1 F (36.7 C) 98.9 F (37.2 C) (!) 97.3 F (36.3 C)  TempSrc: Oral Oral Oral Oral  SpO2: 94% 99% 95% 98%  Weight:    99.4 kg  Height:       General: alert and cooperative  HEENT: Eye patch over right eye Heart: S1, S2 normal, no murmur, rub or gallop, regular rate and rhythm Lungs: clear to auscultation, no wheezes or rales and unlabored breathing Abdomen: abdomen is soft without significant tenderness, masses, organomegaly or guarding Extremities: extremities normal, atraumatic, no cyanosis or edema Musculoskeletal: no joint tenderness, deformity or swelling Neurology: normal without focal findings, mental status, speech normal, alert and oriented x3, PERLA and reflexes normal and symmetric Psychiatry: Normal mood and affect   Assessment/Plan:  Principal Problem:   Acute vestibular syndrome Active Problems:   HTN (hypertension)   Mr. is a 54yo male with PMH of HTN, HLD, GERD, and HFrEF presenting with new onset dizziness that began two days PTA. Since admission his vertigo has resolved and he is now asymptomatic.   # Acute Vestibular Syndrome Patients vertigo symptoms have resolved. Today he reports having some dizziness when getting up to go to the bathroom, different then when he first came in. Planning on obtaining MRI today to evaluate for CVA as the cause of his symptoms. If MRI shows no  acute findings patient will possibly be discharged later today.   - MRI with sedation planned for today - Meclizine prn  - OT recommending no further Intervention  - PT recommending vestibular rehab as an outpatient.   # Systolic Heart Failure # HTN Most recent echo with EF 60-65%, previous 2017 EF 50%. BP well controlled during this admission. Patient states that he also takes HCTZ in addition to spironolactone and enalapril, however, this is not documented anywhere per chart review. In addition, patient reports he is no longer taking Coreg. BP today has been well controlled on current regimen. Will need to resume Coreg on discharge given history of heart failure.   - cont. spironolactone 25 mg qd and enalapril 20 mg bid - Start Coreg 6.5 mg BID  Dispo: Anticipated discharge pending MRI.    April 22, M.D. PGY2 Pager [phone] 03/27/2019 6:34 AM

## 2019-03-27 NOTE — Progress Notes (Signed)
Used [employer] video interpreter [place] for preop interview.

## 2019-03-27 NOTE — Discharge Summary (Signed)
Name: Phillip [name] MRN: [id number] DOB: 02-Aug-1965 54 y.o. PCP: Velna Ochs, MD  Date of Admission: 03/24/2019  4:31 PM Date of Discharge: 03/27/2019 Attending Physician: Lucious Groves, DO  Discharge Diagnosis: 1. Acute vestibular syndrome 2. HTN 3. Hx of systolic heart failure  Discharge Medications: Allergies as of 03/27/2019   No Known Allergies     Medication List    TAKE these medications   carvedilol 6.25 MG tablet Commonly known as: COREG Take 1 tablet (6.25 mg total) by mouth 2 (two) times daily with a meal. What changed:   medication strength  how much to take  additional instructions   enalapril 20 MG tablet Commonly known as: VASOTEC Take 1 tablet (20 mg total) by mouth 2 (two) times daily. HF program   spironolactone 25 MG tablet Commonly known as: ALDACTONE Take 1 tablet (25 mg total) by mouth daily. HF program       Disposition and follow-up:   Phillip [name] was discharged from [facility] in Stable condition.  At the hospital follow up visit please address:  1.  Acute vestibular syndrome: Please assess symptoms. Please make sure he has vestibular PT arranged.  HFrEF: Has history of low EF that has improved. Restarted his Coreg on discharge. Please check BP and adjust home medications as needed.   2.  Labs / imaging needed at time of follow-up: None  3.  Pending labs/ test needing follow-up: None  Follow-up Appointments: Follow-up Information    [facility] up.   Specialty: Rehabilitation Why: They will call you in the next week to schedule an appointment Contact information: [address] [place]       Velna Ochs, MD. Schedule an appointment as soon as possible for a visit in 1 week(s).   Specialty: Internal Medicine Contact information: [place]  [address] [place] Hospital Course by problem list: 1. 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 Orthostatics were negative. Labs unremarkable. Given meclizine. Symptoms of vertigo resolved with no recurrence during admission. PT recommended vestibular rehab. OT had no further recommendations. Patient has severe claustrophobia so MRI needed to be done with generalized anesthesia. MRI showed no acute intracranial abnormalities, chronic small vessel ischemic disease. Patient discharged with vestibular PT follow up.   2. HTN:  3. Hx of systolic heart failure: Patient has a history of HF. Last echocardiogram showed an EF of 60-65%, prvious echo in 2017 showed EF of 50%. There was some confusion on which medications patient was on. He was taking spironolactone and enalapril at home. He also reported HCTZ however there was no documentation of this, he reported that he was not taking Coreg. Continued his spironolactone and enalapril on discharge. Restarted Coreg at a lower dose. Held HCTZ.   Discharge Vitals:   BP 111/70   Pulse 66   Temp 98.1 F (36.7 C)   Resp 18   Ht 5\' 9"  (1.753 m)   Wt 99.4 kg   SpO2 94%   BMI 32.37 kg/m   Pertinent Labs, Studies, and Procedures:  BMP Latest Ref Rng & Units 03/26/2019 03/25/2019 03/24/2019  Glucose 70 -  99 mg/dL 500(B) 704(U) 91  BUN 6 - 20 mg/dL 88(B) 16(X) 45(W)  Creatinine 0.61 - 1.24 mg/dL 3.88 8.28 0.03  BUN/Creat Ratio 9 - 20 - - -  Sodium 135 - 145 mmol/L 137 137 137  Potassium 3.5 - 5.1 mmol/L 4.9 4.1 4.5  Chloride 98 - 111 mmol/L 102 105 104  CO2 22 - 32 mmol/L 25 24 25   Calcium 8.9 - 10.3 mg/dL 9.3 9.3 9.3   CBC Latest Ref Rng & Units 03/26/2019 03/25/2019 03/24/2019  WBC 4.0 - 10.5 K/uL 7.0  6.6 8.6  Hemoglobin 13.0 - 17.0 g/dL 03/26/2019 49.1 79.1  Hematocrit 39.0 - 52.0 % 43.4 41.5 41.8  Platelets 150 - 400 K/uL 313 298 310   MRI 03/26/2018:  IMPRESSION: No evidence of acute intracranial abnormality.  Mild chronic small vessel ischemic disease.  Left maxillary sinus mucous retention cyst.   Discharge Instructions: Discharge Instructions    Ambulatory referral to Physical Therapy   Complete by: As directed    Vestibular PT   Call MD for:  difficulty breathing, headache or visual disturbances   Complete by: As directed    Call MD for:  extreme fatigue   Complete by: As directed    Call MD for:  hives   Complete by: As directed    Call MD for:  persistant dizziness or light-headedness   Complete by: As directed    Call MD for:  persistant nausea and vomiting   Complete by: As directed    Call MD for:  redness, tenderness, or signs of infection (pain, swelling, redness, odor or green/yellow discharge around incision site)   Complete by: As directed    Call MD for:  severe uncontrolled pain   Complete by: As directed    Call MD for:  temperature >100.4   Complete by: As directed    Diet - low sodium heart healthy   Complete by: As directed    Increase activity slowly   Complete by: As directed    Increase activity slowly   Complete by: As directed       Signed: 56/04/2018, MD 03/28/2019, 9:06 PM   Pager: [phone]

## 2019-03-27 NOTE — Discharge Instructions (Signed)
Phillip [name],   It has been a pleasure working with you and we are glad you're feeling better. You were hospitalized for dizziness. You were treated with some nausea medication and you are feeling better now. You did not have a stroke.    For your high blood pressure and cardiac issues, please continue taking your medications with the following changes. Please restart your Coreg, I have sent a new prescription to the pharmacy for you. Please stop taking the hydrochlorothiazide.   Follow up with your primary care provider in 1-2 weeks  If your symptoms worsen or you develop new symptoms, please seek medical help whether it is your primary care provider or emergency department.  If you have any questions about this hospitalization please call [phone]. [name],  Ha sido un placer trabajar con usted y nos alegra que se sienta mejor. Fue hospitalizado por [employer]. Lo trataron con un medicamento para las nuseas y Geographical information systems officer se Risk manager. No tuvo un derrame cerebral.  Para su presin arterial alta y problemas cardacos, contine tomando sus medicamentos con los siguientes cambios. Reinicie su Coreg, le he enviado una nueva receta a la farmacia. Deje de tomar hidroclorotiazida.  Haga un seguimiento con su proveedor de Marine scientist en 1-2 semanas  Si sus sntomas empeoran o presenta nuevos sntomas, busque ayuda mdica, ya sea su proveedor de atencin primaria o el departamento de emergencias.  Si tiene [employer] hospitalizacin, llame al [phone].

## 2019-03-28 LAB — METHYLMALONIC ACID, SERUM: Methylmalonic Acid, Quantitative: 182 nmol/L (ref 0–378)

## 2019-03-29 ENCOUNTER — Encounter: Payer: Self-pay | Admitting: *Deleted

## 2019-03-29 NOTE — Anesthesia Postprocedure Evaluation (Signed)
Anesthesia Post Note  Patient: Phillip [name]  Procedure(s) Performed: MRI WITH ANESTHESIA (N/A )     Patient location during evaluation: PACU Anesthesia Type: General Level of consciousness: awake and alert Pain management: pain level controlled Vital Signs Assessment: post-procedure vital signs reviewed and stable Respiratory status: spontaneous breathing, nonlabored ventilation, respiratory function stable and patient connected to nasal cannula oxygen Cardiovascular status: blood pressure returned to baseline and stable Postop Assessment: no apparent nausea or vomiting Anesthetic complications: no    Last Vitals:  Vitals:   03/27/19 1220 03/27/19 1234  BP: 120/76 111/70  Pulse: 66 66  Resp: 13 18  Temp:  36.7 C  SpO2: 94% 94%    Last Pain:  Vitals:   03/27/19 1234  TempSrc:   PainSc: 0-No pain                 [name]

## 2019-04-14 ENCOUNTER — Ambulatory Visit (INDEPENDENT_AMBULATORY_CARE_PROVIDER_SITE_OTHER): Payer: Self-pay | Admitting: Internal Medicine

## 2019-04-14 ENCOUNTER — Other Ambulatory Visit: Payer: Self-pay

## 2019-04-14 ENCOUNTER — Encounter: Payer: Self-pay | Admitting: Internal Medicine

## 2019-04-14 VITALS — BP 125/73 | HR 81 | Temp 98.1°F | Ht 67.0 in | Wt 226.7 lb

## 2019-04-14 DIAGNOSIS — H8190 Unspecified disorder of vestibular function, unspecified ear: Secondary | ICD-10-CM

## 2019-04-14 DIAGNOSIS — I10 Essential (primary) hypertension: Secondary | ICD-10-CM

## 2019-04-14 DIAGNOSIS — Z79899 Other long term (current) drug therapy: Secondary | ICD-10-CM

## 2019-04-14 NOTE — Progress Notes (Signed)
   CC: Hospital follow up for Acute Vestibular Syndrome   HPI:  [name] is a 54 y.o. male with a past medical history stated below and presents today for hospital follow up. Please see problem based assessment and plan for additional details.   Past Medical History:  Diagnosis Date  . Cardiac insufficiency (HCC)   . CHF (congestive heart failure) (HCC)   . Chronic back pain    "neck down" (07/20/2013)  . Chronic systolic heart failure (HCC) 06/15/2014  . GERD (gastroesophageal reflux disease)   . High cholesterol   . Hypertension   . Renal calculus   . 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  Respiratory: Negative for cough and shortness of breath.   Cardiovascular: Negative for chest pain and leg swelling.  Gastrointestinal: Negative for abdominal pain.  Genitourinary: Negative for dysuria.  Neurological: Negative for weakness.     Vitals:   04/14/19 1458  BP: 125/73  Pulse: 81  Temp: 98.1 F (36.7 C)  TempSrc: Oral  SpO2: 98%  Weight: 226 lb 11.2 oz (102.8 kg)  Height: 5\' 7"  (1.702 m)     Physical Exam: Physical Exam  Constitutional: He is oriented to person, place, and time and well-developed, well-nourished, and in no distress.  HENT:  Head: Normocephalic and atraumatic.  Eyes: EOM are normal.  Cardiovascular: Normal rate, regular rhythm, normal heart sounds and intact distal pulses. Exam reveals no gallop and no friction rub.  No murmur heard. Pulmonary/Chest: Effort normal and breath sounds normal.  Abdominal: Soft. He exhibits no distension. There is no abdominal tenderness.  Musculoskeletal:        General: No tenderness or edema. Normal range of motion.     Cervical back: Normal range of motion.  Neurological: He is alert and oriented to person, place, and time.  Skin: Skin is warm and dry.     Assessment & Plan:   See  Encounters Tab for problem based charting.  Patient discussed with Dr. 

## 2019-04-14 NOTE — Patient Instructions (Signed)
Thank you, [name] for allowing [place] to provide your care today. Today we discussed hospital follow up.    I have ordered none labs for you. I will call if any are abnormal.    I have place a referrals to none.   I have ordered the following tests: none   I have ordered the following medication/changed the following medications: continue medications as prescribed    Please follow-up in 4 - 6 months with you PCP.    Should you have any questions or concerns please call the internal medicine clinic at [phone].    Dellia Cloud, D.O. [facility] Internal Medicine

## 2019-04-15 ENCOUNTER — Encounter: Payer: Self-pay | Admitting: Internal Medicine

## 2019-04-15 NOTE — Assessment & Plan Note (Signed)
Patient is normotensive today with a blood pressure of 125/73.  He is tolerating his blood pressure medications well.  Plan: -Continue hypertension medications as prescribed

## 2019-04-15 NOTE — Assessment & Plan Note (Signed)
Patient presents today for a hospital follow-up.  He was recently discharged from [facility] for acute vestibular syndrome.  He states that all of his symptoms have since resolved.  He denies any dizziness, vertigo, gait instability.  He was referred to outpatient vestibular rehab.  Patient states that he is not attended any rehab appointments and he does not intend to.  He states that his symptoms have resolved and that the cost to do the rehab is too expensive.  Plan: -Counseled the patient to reach back out the clinic if he starts to have symptoms again. -I encouraged him to follow-up with physical therapy.

## 2019-04-15 NOTE — Progress Notes (Signed)
Internal Medicine Clinic Attending  Case discussed with Dr. Coe at the time of the visit.  We reviewed the resident's history and exam and pertinent patient test results.  I agree with the assessment, diagnosis, and plan of care documented in the resident's note.    

## 2019-05-13 ENCOUNTER — Other Ambulatory Visit: Payer: Self-pay | Admitting: Internal Medicine

## 2019-05-17 ENCOUNTER — Telehealth: Payer: Self-pay | Admitting: *Deleted

## 2019-05-17 NOTE — Telephone Encounter (Signed)
Please notify pt for orange card

## 2019-06-21 ENCOUNTER — Other Ambulatory Visit: Payer: Self-pay | Admitting: *Deleted

## 2019-06-21 ENCOUNTER — Other Ambulatory Visit: Payer: Self-pay | Admitting: Internal Medicine

## 2019-06-21 DIAGNOSIS — I5022 Chronic systolic (congestive) heart failure: Secondary | ICD-10-CM

## 2019-06-21 DIAGNOSIS — I10 Essential (primary) hypertension: Secondary | ICD-10-CM

## 2019-06-21 MED ORDER — ENALAPRIL MALEATE 20 MG PO TABS: 20.0000 mg | ORAL_TABLET | Freq: Two times a day (BID) | ORAL | 8 refills | Status: DC

## 2019-06-21 MED ORDER — SPIRONOLACTONE 25 MG PO TABS: 25.0000 mg | ORAL_TABLET | Freq: Every day | ORAL | 8 refills | Status: DC

## 2019-07-09 ENCOUNTER — Other Ambulatory Visit: Payer: Self-pay | Admitting: Internal Medicine

## 2019-07-09 ENCOUNTER — Ambulatory Visit: Payer: Self-pay | Admitting: Internal Medicine

## 2019-07-09 DIAGNOSIS — I5023 Acute on chronic systolic (congestive) heart failure: Secondary | ICD-10-CM

## 2019-07-09 DIAGNOSIS — Z79899 Other long term (current) drug therapy: Secondary | ICD-10-CM

## 2019-07-09 DIAGNOSIS — I5022 Chronic systolic (congestive) heart failure: Secondary | ICD-10-CM

## 2019-07-09 DIAGNOSIS — I10 Essential (primary) hypertension: Secondary | ICD-10-CM

## 2019-07-09 MED ORDER — CARVEDILOL 6.25 MG PO TABS: ORAL_TABLET | ORAL | 5 refills | Status: DC

## 2019-07-09 MED ORDER — ENALAPRIL MALEATE 20 MG PO TABS: 20.0000 mg | ORAL_TABLET | Freq: Two times a day (BID) | ORAL | 8 refills | Status: DC

## 2019-07-09 MED ORDER — FUROSEMIDE 40 MG PO TABS: 40.0000 mg | ORAL_TABLET | Freq: Every day | ORAL | 1 refills | Status: DC | PRN

## 2019-07-09 MED ORDER — SPIRONOLACTONE 25 MG PO TABS: 25.0000 mg | ORAL_TABLET | Freq: Every day | ORAL | 8 refills | Status: DC

## 2019-07-09 NOTE — Progress Notes (Signed)
   CC: Shortness of Breath  HPI:  [name] is a 54 y.o., with a PMH noted below, who presents to the [facility] with complaints of SHOB and fatigue. To see the management of his acute and chronic conditions, please see the attached A&P note.   Past Medical History:  Diagnosis Date  . Cardiac insufficiency (HCC)   . CHF (congestive heart failure) (HCC)   . Chronic back pain    "neck down" (07/20/2013)  . Chronic systolic heart failure (HCC) 06/15/2014  . GERD (gastroesophageal reflux disease)   . High cholesterol   . Hypertension   . Renal calculus   . Toxic effect of chemicals 07/10/2018   Review of Systems:   Review of Systems  Constitutional: Positive for malaise/fatigue. Negative for chills and weight loss.       Endorses fatigue  Eyes: Negative for double vision and photophobia.  Respiratory: Negative for cough and hemoptysis.   Cardiovascular: Positive for orthopnea. Negative for chest pain, palpitations, claudication and leg swelling.  Gastrointestinal: Negative for abdominal pain, constipation, diarrhea, nausea and vomiting.       Complains of abdominal fullness  Neurological: Negative for dizziness, tingling and headaches.    Physical Exam:  Vitals:   07/09/19 0916 07/09/19 0920 07/09/19 0921 07/09/19 0922  BP: 127/82 125/82 128/80 115/83  Pulse: 80 77 75 80  Temp: 98.2 F (36.8 C)     TempSrc: Oral     SpO2: 99%     Weight: 229 lb 4.8 oz (104 kg)     Height: 5\' 7"  (1.702 m)      Physical Exam Vitals and nursing note reviewed.  Constitutional:      General: He is not in acute distress.    Appearance: Normal appearance. He is not ill-appearing or toxic-appearing.  HENT:     Head: Normocephalic and atraumatic.  Eyes:     General:        Right eye: No discharge.        Left eye: No discharge.     Conjunctiva/sclera: Conjunctivae normal.  Cardiovascular:     Rate and Rhythm: Normal rate and regular rhythm.     Pulses: Normal pulses.     Heart  sounds: Normal heart sounds. No murmur. No friction rub. No gallop.   Pulmonary:     Effort: Pulmonary effort is normal.     Breath sounds: Normal breath sounds. No decreased breath sounds, wheezing, rhonchi or rales.  Abdominal:     General: Bowel sounds are normal.     Tenderness: There is no abdominal tenderness. There is no guarding.     Comments: Abdomen distended  Musculoskeletal:        General: No swelling.     Right lower leg: No edema.     Left lower leg: No edema.  Neurological:     General: No focal deficit present.     Mental Status: He is alert and oriented to person, place, and time.  Psychiatric:        Mood and Affect: Mood normal.        Behavior: Behavior normal.     Assessment & Plan:   See Encounters Tab for problem based charting.  Patient discussed with Dr. 

## 2019-07-09 NOTE — Assessment & Plan Note (Signed)
[name] presents with a history of chronic systolic heart failure. EF 30-35% in 2016, 50% in 2017, with EF of 60-65% in 2019. Currently on Enalapril 20 mg BID, Spironolactone 25 mg QD, carvedilol 6.25 mg BID.   Patient reports today that he has noticed shortness of breath, fatigue, and orthopnea over the past week. Additionally, he endorses that his pants are fitting more tightly. He states that over the past two months he went [place] see see [place] and has been out of his medications. He denies fever, nausea, vomiting, chest pain, palpitations, abdominal pain constipation, nausea, or lower leg swelling.   On physical examination, lungs are clear to auscultation bilaterally, abdomen appears distended, with trace pitting edema in the lower extremities. Due to being off his medications, with a history of heart failure with symptoms of fluid retention, he is likely presenting due to non compliance. We discussed changing enalapril and coreg to medications that could be taken once a day (lisinopril and metoprolol succinate) for compliance, but patient declined at this time. Patient will be placed on Lasix 40 mg QD PRN for symptomatic relief.   Plan:  - Continue current medical regimen  - Lasix 40 mg QD PRN

## 2019-07-09 NOTE — Progress Notes (Signed)
Internal Medicine Clinic Attending  Case discussed with Dr. Winters at the time of the visit.  We reviewed the resident's history and exam and pertinent patient test results.  I agree with the assessment, diagnosis, and plan of care documented in the resident's note.  

## 2019-07-09 NOTE — Patient Instructions (Signed)
To [name],  Today we talked about your shortness of breath. It is likely due to having fluid on your stomach. We will restart your medications today. Additionally, we will give you Lasix to get the fluid off. Take the lasix daily as needed for symptom relief. Have a good day!  Sincerely,  Dolan Amen, MD

## 2019-07-17 IMAGING — CR DG CHEST 2V
2 series · 2 of 2 positions shown · non-contrast
Comparison: Radiographs 07/21/2015

CLINICAL DATA: Cough and fever since yesterday.

EXAM:
CHEST - 2 VIEW

[chest pa]
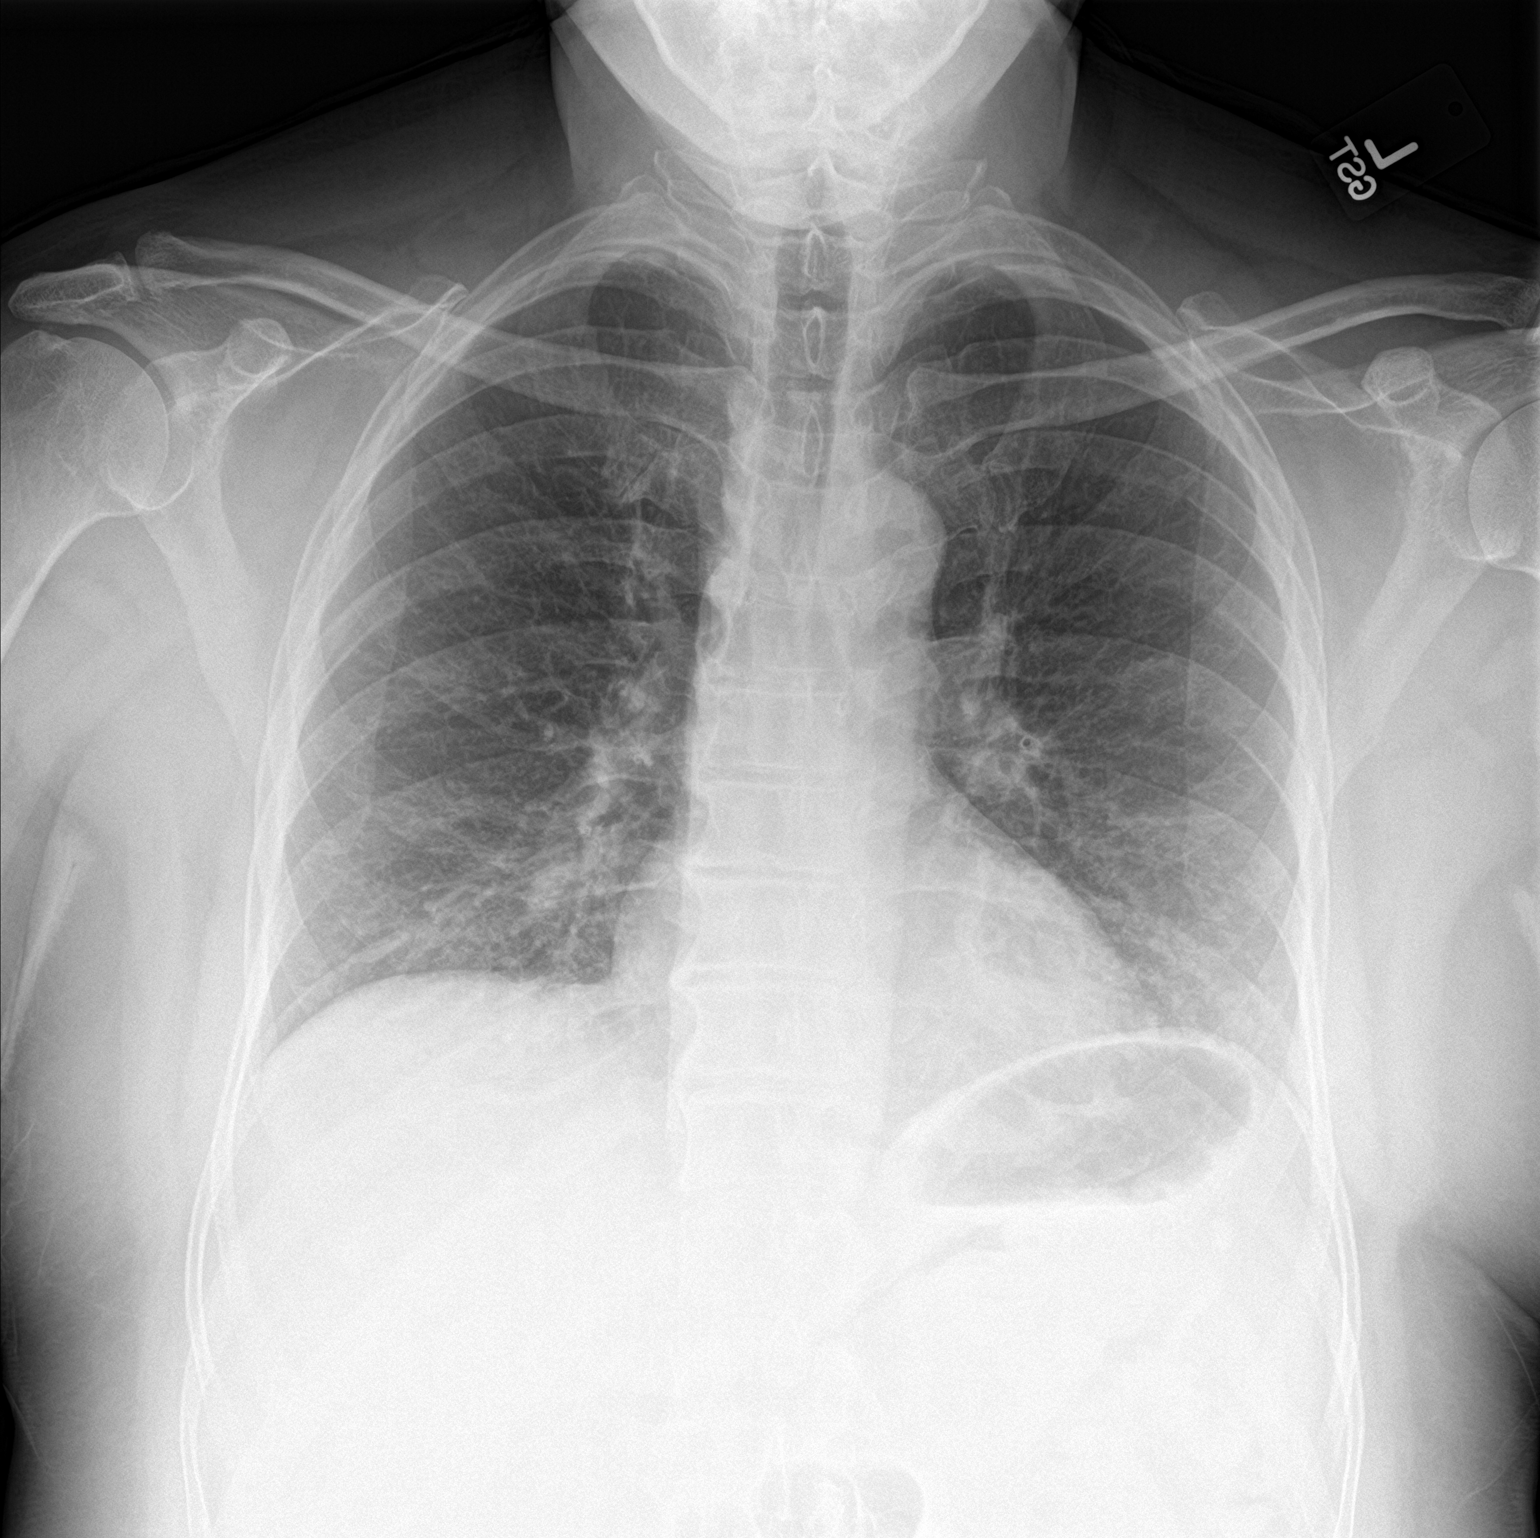

[chest lat]
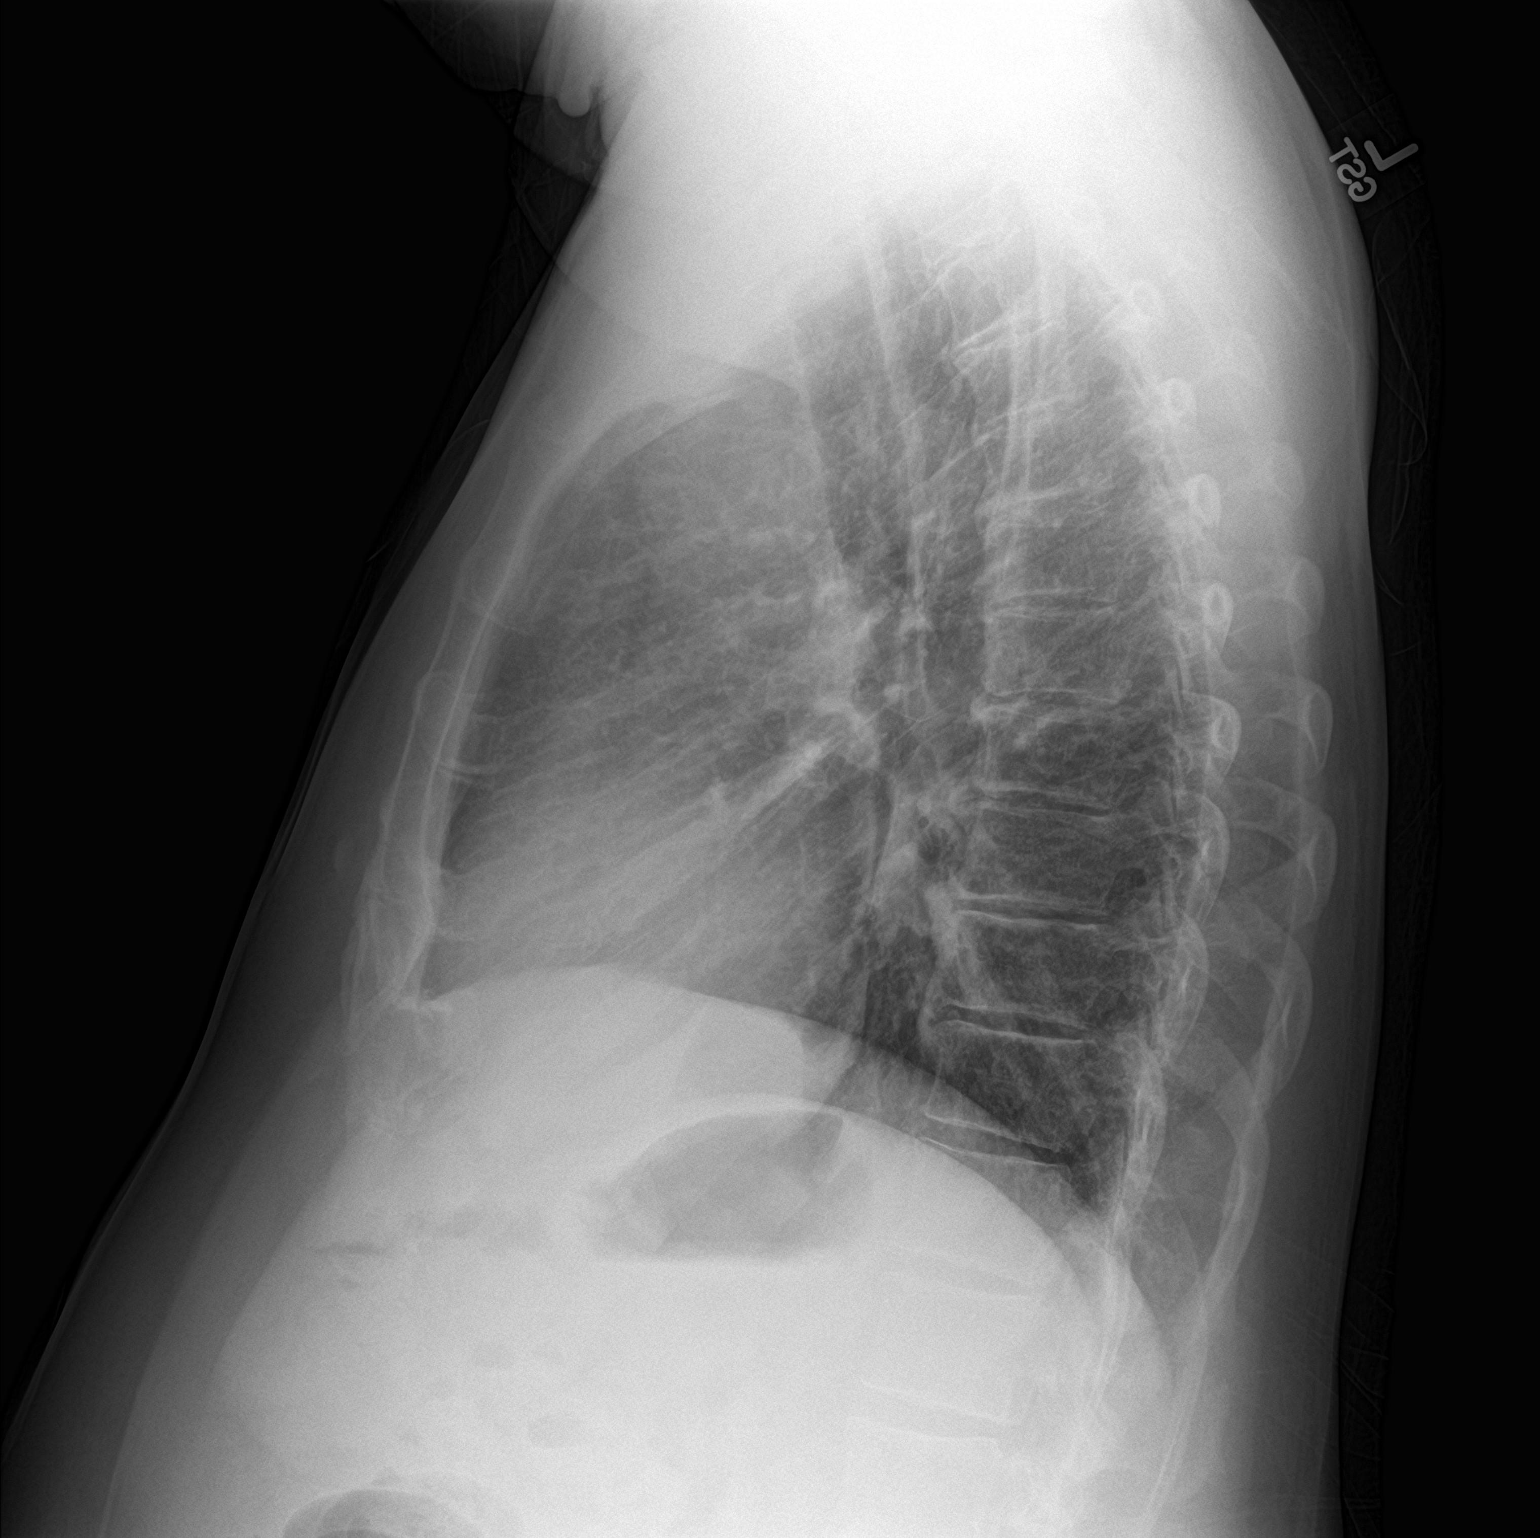

[2 of 2 positions shown; findings below may reference images not displayed]

FINDINGS: The cardiomediastinal contours are normal. Heterogeneous bibasilar
opacities, slightly more prominent on the left. Pulmonary
vasculature is normal. No confluent consolidation, pleural effusion,
or pneumothorax. No acute osseous abnormalities are seen.
IMPRESSION: Heterogeneous bibasilar opacities, left greater than right, favoring
atelectasis, however pneumonia is considered in the setting of cough
and fever.

## 2019-08-17 ENCOUNTER — Other Ambulatory Visit: Payer: Self-pay

## 2019-08-17 ENCOUNTER — Other Ambulatory Visit: Payer: Self-pay | Admitting: *Deleted

## 2019-08-17 ENCOUNTER — Ambulatory Visit: Payer: Self-pay | Admitting: Internal Medicine

## 2019-08-17 ENCOUNTER — Telehealth: Payer: Self-pay | Admitting: *Deleted

## 2019-08-17 VITALS — BP 138/82 | HR 72 | Temp 98.1°F | Ht 67.0 in | Wt 230.1 lb

## 2019-08-17 DIAGNOSIS — M151 Heberden's nodes (with arthropathy): Secondary | ICD-10-CM

## 2019-08-17 MED ORDER — DICLOFENAC SODIUM 1 % EX GEL: 2.0000 g | Freq: Four times a day (QID) | CUTANEOUS | 2 refills | Status: DC

## 2019-08-17 NOTE — Telephone Encounter (Signed)
Pt is a WALK IN C/o R small finger being swollen and painful at the first joint for 1 1/2 weeks. States hands sometimes are tight and achy when awakening in am. Desires to be seen, [facility] at 1045, informed dr Midwife and gladys.

## 2019-08-17 NOTE — Telephone Encounter (Signed)
Call from [facility] - requesting to change Voltaren gel to qty of 100 g instead of 50. And to include "IM Program" on rx. Please re-send Thanks

## 2019-08-17 NOTE — Patient Instructions (Signed)
Thank you for allowing [place] to provide your care today. Today we discussed your finger pain    I have ordered no labs for you. I will call if any are abnormal.    Today we made the following changes to your medications.  Please use voltaren gel as needed on your finger  Please follow-up if your symptoms worsen.  Should you have any questions or concerns please call the internal medicine clinic at [phone].     Artritis Arthritis La palabra "artritis" significa tener dolor en las articulaciones. Tambin puede referirse a una enfermedad de las articulaciones. Una articulacin es Immunologist en el que se juntan los [place]. Hay ms de 100 tipos de artritis. Cules son las causas? Esta afeccin puede ser causada por lo siguiente:  Desgaste de una articulacin. Esta es la causa ms frecuente.  Cantidad elevada de cido en la sangre, que provoca dolor en la articulacin (gota).  Dolor e hinchazn (inflamacin) en la articulacin.  Infeccin en una articulacin.  Lesiones en una articulacin.  Neomia Dear reaccin a medicamentos (alergia). En algunos casos, es posible que la causa se desconozca. Cules son los signos o los sntomas? Los sntomas de esta afeccin incluyen:  Enrojecimiento de Risk analyst.  Hinchazn en una articulacin.  Rigidez en una articulacin.  Calor que proviene de Nurse, learning disability.  Grant Ruts.  Sensacin de estar enfermo. Cmo se trata? El tratamiento de esta afeccin puede incluir:  Dar tratamiento a la causa si se la conoce.  Reposo.  Levantamiento (elevacin) de la articulacin.  Aplicar compresas fras o calientes sobre Nurse, learning disability.  Medicamentos para tratar los sntomas y reducir Chief Technology Officer y la hinchazn.  Inyecciones de medicamentos (cortisona) en la articulacin. Tambin pueden indicarle que haga cambios en su vida, como hacer ejercicio y Publishing copy de [place]. Siga estas instrucciones en su casa: Medicamentos  [employer] de venta  libre y los recetados solamente como se lo haya indicado el mdico.  No tome aspirina para el dolor si el mdico le dice que puede tener gota. Actividad  Ponga la articulacin en reposo si el mdico se lo indica.  Evite las actividades que [employer].  Haga ejercicios para la articulacin con regularidad tal como se lo indic el mdico. Pruebe hacer ejercicios como: ? Natacin. ? Gimnasia acutica. ? Andar en bicicleta. ? Caminar. Control del dolor, la rigidez y la hinchazn      Si se lo indican, aplique hielo en la zona afectada. ? Ponga el hielo en una bolsa plstica. ? Coloque una [employer] piel y Copy. ? Coloque el hielo durante , 2a3veces al da.  Si la articulacin est hinchada, levntela (elvela) por encima del nivel del corazn como se lo haya indicado el mdico.  Si tiene la articulacin rgida por la maana, pruebe tomar una ducha tibia.  Si se lo indican, aplique calor en la zona afectada. Hgalo con la frecuencia que le haya indicado el mdico. Use la fuente de calor que el mdico le recomiende, como una compresa de calor hmedo o una almohadilla trmica. Si tiene diabetes, no aplique calor sin consultar a su mdico. Para aplicar calor: ? Coloque una toalla entre la piel y la fuente de Airline pilot. ? Aplique calor durante 20 a . ? Retire la fuente de calor si la piel se pone de color rojo brillante. Esto es muy importante si no puede Financial risk analyst, calor o fro. Puede correr un riesgo mayor de sufrir quemaduras. Indicaciones generales  No  consuma ningn producto que contenga nicotina o tabaco, como cigarrillos, cigarrillos electrnicos y tabaco de Higher education careers adviser. Si necesita ayuda para dejar de fumar, consulte al mdico.  Concurra a todas las visitas de seguimiento como se lo haya indicado el mdico. Esto es importante. Comunquese con un mdico si:  [place] representative.  Tiene fiebre. Solicite ayuda inmediatamente si:  Tiene un dolor  muy intenso en la articulacin.  Tiene hinchazn en la articulacin.  La articulacin est roja.  Comienza a tener hinchazn y Market researcher.  Tiene un dolor muy intenso en la espalda.  Tiene la pierna muy dbil.  No puede controlar las evacuaciones de pis (orina) o materia fecal (heces). Resumen  La palabra "artritis" significa tener dolor en las articulaciones. Tambin puede referirse a una enfermedad de las articulaciones. Una articulacin es TEFL teacher en el que se juntan los [place].  La causa ms frecuente de esta afeccin es el desgaste de una articulacin.  Los principales sntomas de esta afeccin son enrojecimiento, hinchazn o Advertising account executive.  Esta afeccin se trata con reposo, elevacin de la articulacin, medicamentos y colocando compresas fras o calientes sobre la articulacin.  Siga las indicaciones del mdico acerca de los medicamentos, la [place], los ejercicios y otros tratamientos para el cuidado Financial planner. Esta informacin no tiene Marine scientist el consejo del mdico. Asegrese de hacerle al mdico cualquier pregunta que tenga. Document Revised: 03/30/2018 Document Reviewed: 03/30/2018 Elsevier Patient Education  [place].

## 2019-08-18 ENCOUNTER — Encounter: Payer: Self-pay | Admitting: Internal Medicine

## 2019-08-18 DIAGNOSIS — M151 Heberden's nodes (with arthropathy): Secondary | ICD-10-CM | POA: Insufficient documentation

## 2019-08-18 NOTE — Assessment & Plan Note (Signed)
Phillip [name] is a 54 yo M w/ PMH of HTN, systolic heart failure, HLD, MDD presenting to [facility] w/ complaint of finger swelling. He was in his usual state of health until couple weeks ago he noticed swelling of his 5th finger on his R hand accompanied with soreness. He mentions that pain of his hands is chronic. Usually worse in the morning and improved during the day. 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 He denies any trauma or bug bites. Denies any warmth or drainage.  A/P On exam, finger deformity consistent w/ Heberden's node. Discussed physical finding's indication of osteoarthritis. Will suggest treatment w/ topical NSAID - Topical OTC diclofenac gel

## 2019-08-18 NOTE — Progress Notes (Signed)
   CC: Finger swelling  HPI: Phillip [name] is a 54 y.o. with PMH listed below presenting with complaint of finger swelling. Please see problem based assessment and plan for further details.  Past Medical History:  Diagnosis Date  . Cardiac insufficiency (HCC)   . CHF (congestive heart failure) (HCC)   . Chronic back pain    "neck down" (07/20/2013)  . Chronic systolic heart failure (HCC) 06/15/2014  . GERD (gastroesophageal reflux disease)   . High cholesterol   . Hypertension   . Renal calculus   . Toxic effect of chemicals 07/10/2018    Review of Systems: Review of Systems  Constitutional: Negative for chills, fever and malaise/fatigue.  Eyes: Negative for blurred vision.  Cardiovascular: Negative for chest pain, palpitations and leg swelling.  Gastrointestinal: Negative for constipation, diarrhea, nausea and vomiting.  Musculoskeletal: Positive for back pain and joint pain.  Neurological: Negative for dizziness and headaches.  All other systems reviewed and are negative.    Physical Exam: Vitals:   08/17/19 1030  BP: 138/82  Pulse: 72  Temp: 98.1 F (36.7 C)  TempSrc: Oral  SpO2: 99%  Weight: 230 lb 1.6 oz (104.4 kg)  Height: 5\' 7"  (1.702 m)   Physical Exam  Constitutional: He is oriented to person, place, and time. He appears well-developed and well-nourished. No distress.  HENT:  Mouth/Throat: Oropharynx is clear and moist.  Eyes: Conjunctivae are normal.  Cardiovascular: Normal rate, regular rhythm, normal heart sounds and intact distal pulses.  No murmur heard. Respiratory: Effort normal and breath sounds normal. He has no wheezes. He has no rales.  GI: Soft. Bowel sounds are normal. He exhibits no distension. There is no abdominal tenderness.  Musculoskeletal:        General: Tenderness and deformity (Heberden's nodes on 5th DIP of R hand) present. Normal range of motion.  Neurological: He is alert and oriented to person, place, and time.  Skin:  Skin is warm and dry.       Assessment & Plan:   Heberden node Phillip [name] is a 54 yo M w/ PMH of HTN, systolic heart failure, HLD, MDD presenting to [facility] w/ complaint of finger swelling. He was in his usual state of health until couple weeks ago he noticed swelling of his 5th finger on his R hand accompanied with soreness. He mentions that pain of his hands is chronic. Usually worse in the morning and improved during the day. 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 He denies any trauma or bug bites. Denies any warmth or drainage.  A/P On exam, finger deformity consistent w/ Heberden's node. Discussed physical finding's indication of osteoarthritis. Will suggest treatment w/ topical NSAID - Topical OTC diclofenac gel    Patient discussed with Dr. [facility]   -Rogelia Boga, PGY2 [facility] Internal Medicine Pager: [phone]

## 2019-08-24 NOTE — Progress Notes (Signed)
Internal Medicine Clinic Attending ° °Case discussed with Dr. Lee at the time of the visit.  We reviewed the resident’s history and exam and pertinent patient test results.  I agree with the assessment, diagnosis, and plan of care documented in the resident’s note.  °

## 2019-09-28 ENCOUNTER — Ambulatory Visit: Payer: Self-pay | Admitting: Internal Medicine

## 2019-09-28 ENCOUNTER — Other Ambulatory Visit: Payer: Self-pay

## 2019-09-28 ENCOUNTER — Encounter: Payer: Self-pay | Admitting: Internal Medicine

## 2019-09-28 VITALS — BP 127/82 | HR 90 | Temp 98.0°F | Wt 230.4 lb

## 2019-09-28 DIAGNOSIS — M79641 Pain in right hand: Secondary | ICD-10-CM

## 2019-09-28 DIAGNOSIS — F332 Major depressive disorder, recurrent severe without psychotic features: Secondary | ICD-10-CM

## 2019-09-28 DIAGNOSIS — F329 Major depressive disorder, single episode, unspecified: Secondary | ICD-10-CM

## 2019-09-28 DIAGNOSIS — M79642 Pain in left hand: Secondary | ICD-10-CM

## 2019-09-28 MED ORDER — IBUPROFEN 400 MG PO TABS: 400.0000 mg | ORAL_TABLET | Freq: Four times a day (QID) | ORAL | 0 refills | Status: DC | PRN

## 2019-09-28 NOTE — Patient Instructions (Addendum)
Phillip [name],  It was a pleasure seeing you in clinic. Today we discussed:   Bilateral hand pain:  I am prescribing you ibuprofen 400mg  every 6 hours as needed for hand pain. I would also like to get some tests to confirm what type of arthritis this is.   If you have any questions or concerns, please call our clinic at [phone] between 9am-5pm and after hours call [phone] and ask for the internal medicine resident on call. If you feel you are having a medical emergency please call 911.   Thank you, we look forward to helping you remain healthy!  If you have not already done so, I would recommend getting your COVID19 vaccine.  To schedule an appointment for a COVID vaccine or be added to the vaccine wait list: Go to [phone]   OR Go to [email or web address]                  OR Call [phone]                                     OR Call [phone] and select Option 2

## 2019-09-29 ENCOUNTER — Encounter: Payer: Self-pay | Admitting: Internal Medicine

## 2019-09-29 NOTE — Assessment & Plan Note (Signed)
Patient previously on antidepressant in 2018 for major depressive disorder, has not been on antidepressants since then presenting with bilateral hand pain today. His mood appears to be depressed. PHQ9 score of 13 and patient endorses suicidal ideation without plan. Extensive conversation with patient offering counseling services and antidepressant as he previously responded well to citalopram. Patients notes that his current depression is secondary to "family problems" and is not currently interested in counseling or starting antidepressant therapy. He denies any suicidal plan and does not wish to currently hurt himself.  Plan:  Continue to monitor and encourage antidepressant use if his symptoms persist

## 2019-09-29 NOTE — Progress Notes (Signed)
   CC: bilateral hand pain  HPI:  [name] is a 54 y.o. male with PMHx as listed below presenting for concerns of bilateral hand pain. He endorses that it has been going on "for a while" and is causing significant distress. He notes pain in MTP and PIP joints bilaterally to be worse in the morning and improves throughout the day. He notes that he is barely able to make a fist initially. Deneis any fevers/chills or family history of autoimmune conditions. Please see problem based charting for complete assessment and plan.   Past Medical History:  Diagnosis Date  . Cardiac insufficiency (HCC)   . CHF (congestive heart failure) (HCC)   . Chronic back pain    "neck down" (07/20/2013)  . Chronic systolic heart failure (HCC) 06/15/2014  . GERD (gastroesophageal reflux disease)   . High cholesterol   . Hypertension   . Renal calculus   . Toxic effect of chemicals 07/10/2018   Review of Systems:  Negative except as stated in HPI.   Physical Exam:  Vitals:   09/28/19 1524  BP: 127/82  Pulse: 90  Temp: 98 F (36.7 C)  TempSrc: Oral  SpO2: 98%  Weight: 230 lb 6.4 oz (104.5 kg)   Physical Exam  Constitutional: Appears well-developed and well-nourished. No distress.  HENT: Normocephalic and atraumatic, moist mucus membranes, EOMI left eye; eye patch on right eye due to prior corneal surgery Cardiovascular: Normal rate, regular rhythm, S1 and S2 present, no murmurs, rubs, gallops.  Distal pulses intact Respiratory: No respiratory distress, no accessory muscle use.  Effort is normal.   GI: Nondistended, soft,  normal active bowel sounds Musculoskeletal: Normal bulk and tone.  No peripheral edema noted. Heberden node on right fifth DIP; tenderness to palpation of bilateral hand MTP and PIP joints  Neurological: Is alert and oriented x4, no apparent focal deficits noted. Skin: Warm and dry.  No rash, erythema, lesions noted. Psychiatric: Depressed mood; normal affect and  behavior.    Assessment & Plan:   See Encounters Tab for problem based charting.  Patient discussed with Dr. Oswaldo Done

## 2019-09-29 NOTE — Assessment & Plan Note (Signed)
Patient endorses bilateral hand pain that he notes has been ongoing for few weeks although chart review suggestive of similar complaint in September 2020. Patient notes bilateral MTP and PIP joint pain and stiffness that is worse in the morning and takes several hours to improve. He notes that it is usually difficult to make a fist as a result of this. He notes some improvement of function with use. On physical examination, has significant tenderness to palpation of MTP and PIP joints bilaterally without any warmth or erythema noted. Appears to be uniform in all fingers; He does have a Heberden node of the left fifth DIP joint that appears to be stable and without significant discomfort.  Previously, this was thought to be possible inflammatory arthritis and patient advised for symptomatic treatment which he notes did not provide much relief. ESR and CRP at the time wnl.  Given concerns for possible rheumatoid arthritis, will obtain anti-RF and anti-CCP antibodies. Will hold off on imaging at this time due to increased financial strain on patient.  Symptomatic treatment with ibuprofen 485m q6h prn.

## 2019-09-29 NOTE — Progress Notes (Signed)
Internal Medicine Clinic Attending ° °Case discussed with Dr. Aslam  At the time of the visit.  We reviewed the resident’s history and exam and pertinent patient test results.  I agree with the assessment, diagnosis, and plan of care documented in the resident’s note.  °

## 2019-09-30 LAB — RHEUMATOID FACTOR: Rheumatoid fact SerPl-aCnc: <10 IU/mL (ref 0.0–13.9)

## 2019-09-30 LAB — CYCLIC CITRUL PEPTIDE ANTIBODY, IGG/IGA: Cyclic Citrullin Peptide Ab: 7 units (ref 0–19)

## 2019-12-03 ENCOUNTER — Other Ambulatory Visit: Payer: Self-pay

## 2019-12-03 DIAGNOSIS — Z20822 Contact with and (suspected) exposure to covid-19: Secondary | ICD-10-CM

## 2019-12-07 LAB — NOVEL CORONAVIRUS, NAA: SARS-CoV-2, NAA: NOT DETECTED

## 2020-01-10 ENCOUNTER — Other Ambulatory Visit: Payer: Self-pay

## 2020-01-10 ENCOUNTER — Emergency Department (HOSPITAL_COMMUNITY)
Admission: EM | Admit: 2020-01-10 | Discharge: 2020-01-11 | Disposition: A | Discharge status: Home / Self Care | Payer: Self-pay | Attending: Emergency Medicine | Admitting: Emergency Medicine

## 2020-01-10 ENCOUNTER — Encounter (HOSPITAL_COMMUNITY): Payer: Self-pay

## 2020-01-10 ENCOUNTER — Emergency Department (HOSPITAL_COMMUNITY): Payer: Self-pay

## 2020-01-10 DIAGNOSIS — Z20822 Contact with and (suspected) exposure to covid-19: Secondary | ICD-10-CM | POA: Insufficient documentation

## 2020-01-10 DIAGNOSIS — Z5321 Procedure and treatment not carried out due to patient leaving prior to being seen by health care provider: Secondary | ICD-10-CM | POA: Insufficient documentation

## 2020-01-10 DIAGNOSIS — R140 Abdominal distension (gaseous): Secondary | ICD-10-CM | POA: Insufficient documentation

## 2020-01-10 DIAGNOSIS — R0602 Shortness of breath: Secondary | ICD-10-CM | POA: Insufficient documentation

## 2020-01-10 LAB — COMPREHENSIVE METABOLIC PANEL
ALT: 34 U/L (ref 0–44)
AST: 29 U/L (ref 15–41)
Albumin: 4.1 g/dL (ref 3.5–5.0)
Alkaline Phosphatase: 48 U/L (ref 38–126)
Anion gap: 10 (ref 5–15)
BUN: 20 mg/dL (ref 6–20)
CO2: 22 mmol/L (ref 22–32)
Calcium: 9.7 mg/dL (ref 8.9–10.3)
Chloride: 103 mmol/L (ref 98–111)
Creatinine, Ser: 1.08 mg/dL (ref 0.61–1.24)
GFR, Estimated: >60 mL/min (ref >60)
Glucose, Bld: 118 mg/dL — ABNORMAL HIGH (ref 70–99)
Potassium: 4.2 mmol/L (ref 3.5–5.1)
Sodium: 135 mmol/L (ref 135–145)
Total Bilirubin: 0.8 mg/dL (ref 0.3–1.2)
Total Protein: 7.7 g/dL (ref 6.5–8.1)

## 2020-01-10 LAB — CBC
HCT: 44.9 % (ref 39.0–52.0)
Hemoglobin: 14.7 g/dL (ref 13.0–17.0)
MCH: 28.3 pg (ref 26.0–34.0)
MCHC: 32.7 g/dL (ref 30.0–36.0)
MCV: 86.5 fL (ref 80.0–100.0)
Platelets: 338 10*3/uL (ref 150–400)
RBC: 5.19 MIL/uL (ref 4.22–5.81)
RDW: 13.7 % (ref 11.5–15.5)
WBC: 7.8 10*3/uL (ref 4.0–10.5)
nRBC: 0 % (ref 0.0–0.2)

## 2020-01-10 LAB — RESPIRATORY PANEL BY RT PCR (FLU A&B, COVID)
Influenza A by PCR: NEGATIVE
Influenza B by PCR: NEGATIVE
SARS Coronavirus 2 by RT PCR: NEGATIVE

## 2020-01-10 LAB — LIPASE, BLOOD: Lipase: 32 U/L (ref 11–51)

## 2020-01-10 LAB — BRAIN NATRIURETIC PEPTIDE: B Natriuretic Peptide: 21.5 pg/mL (ref 0.0–100.0)

## 2020-01-10 NOTE — ED Triage Notes (Signed)
Spanish interpreter used for triage:  Pt reports 1 week of sob, no cough, denies chest pain, reports and bloating and belching.

## 2020-01-11 ENCOUNTER — Telehealth: Payer: Self-pay

## 2020-01-11 ENCOUNTER — Ambulatory Visit (HOSPITAL_COMMUNITY)
Admission: RE | Admit: 2020-01-11 | Discharge: 2020-01-11 | Disposition: A | Discharge status: Home / Self Care | Payer: Self-pay | Source: Ambulatory Visit | Attending: Internal Medicine | Admitting: Internal Medicine

## 2020-01-11 ENCOUNTER — Ambulatory Visit: Payer: Self-pay | Admitting: Student

## 2020-01-11 ENCOUNTER — Encounter: Payer: Self-pay | Admitting: Student

## 2020-01-11 VITALS — BP 104/53 | HR 39 | Temp 98.0°F | Ht 67.0 in | Wt 226.6 lb

## 2020-01-11 DIAGNOSIS — I5022 Chronic systolic (congestive) heart failure: Secondary | ICD-10-CM

## 2020-01-11 DIAGNOSIS — R001 Bradycardia, unspecified: Secondary | ICD-10-CM | POA: Insufficient documentation

## 2020-01-11 NOTE — Telephone Encounter (Signed)
TC from patient, son is on the phone translating.  States pt had SOB yesterday, went to ED, respiratory panel completed and had neg Covid.  Pt LWBS because of wait time. C/O swollen abdomen today, fatigue, states he wants an appt for eval.  Dr. Stark Klein in clinic appt. Scheduled for 1015 today/ Blue team/Dr. Elaina Pattee. SChaplin, RN,BSN

## 2020-01-11 NOTE — Patient Instructions (Signed)
Deje de tomar carvedilol.  Programe y tenga un ecocardiograma  Programe una cita con cardiologa para hablar sobre la monitorizacin de ECG ambulatorio

## 2020-01-11 NOTE — Assessment & Plan Note (Signed)
Patient presenting with increase shortness of breath with activity, orthopnea, and abdominal distention for the past 2 week. States this improves with rest. He went the ED yesterday for the same symptoms but left without being seen. He denies fever, chills, chest pain, palpitations, dizziness, syncope, nausea, vomiting, abdominal pain, constipation, or swelling.  He has a history of nonischemic cardiomyopathy with recovered EF. Currently on enalapril 20 mg twice daily, spironolactone 25 mg daily and carvedilol 6.25 mg twice daily. Is not taking furosemide.   On exam lungs are clear to ascultation, no lower extremity edema, no JVD. O2 saturation 100% on RA while sitting and 07% while walking. Weight is dow 4 lb since last visit in 06/2019.He does not appear fluid overloaded.   Plan Continue on enalapril and spironolactone Discontinue carvedilol due to bradycardia

## 2020-01-11 NOTE — Assessment & Plan Note (Addendum)
Patient presenting for SOB with exertion for the last 2 weeks Noted to be bradycardic to 39 and BP of 104/53. This improved to 80s after sitting down and resting. He denies dizziness, syncope, chest pain, palpitations, nausea, diaphoresis, and abdominal pain.   On exam patient with irregular HR without murmurs gallops or rubs. Lungs clear to ascultation. No JVD or no peripheral edema.  EKG obtained in clinic with HR of 83 sinus rhythm with new frequent PVCs in the pattern of bigeminy. New from EKG on 03/23/2020. Portable Echo in clinic with LVH without pericardial effusion or other structural abnormality. Patient with bradycardia to the 30s with ambulation, but without O2 desaturations. Labs from ED including CBC, CMP, BNP were unremarkable. He is COVID negative. His symptoms appear to correlate with episodes of bradycardia. Etiology of bradycardia is unclear may need ambulatory EKG monitoring. Will refer to cardiology as he has not follow up since 2017.   Plan Discontinue carvedilol TSH Referral to cardiology for possible ambulatory EKG monitoring Echocardiogram ordered Follow up in 1 week

## 2020-01-11 NOTE — Progress Notes (Signed)
   CC: exertional SOB  HPI:  Phillip [name] is a 54 y.o. with history documented below presents today for worsening SOB for the past 2 weeks. Patient interview with interpreter present. Please refer to problem based charting for further details and assessment and plan of current problem and chronic medical conditions.   Past Medical History:  Diagnosis Date  . Cardiac insufficiency (HCC)   . CHF (congestive heart failure) (HCC)   . Chronic back pain    "neck down" (07/20/2013)  . Chronic systolic heart failure (HCC) 06/15/2014  . GERD (gastroesophageal reflux disease)   . High cholesterol   . Hypertension   . Renal calculus   . Toxic effect of chemicals 07/10/2018   Review of Systems:  Negative as per HPI  Physical Exam:  Vitals:   01/11/20 1011  BP: (!) 104/53  Pulse: (!) 39  Temp: 98 F (36.7 C)  TempSrc: Oral  SpO2: 100%  Weight: 226 lb 9.6 oz (102.8 kg)  Height: 5\' 7"  (1.702 m)   Physical Exam Constitutional:      Appearance: He is obese.  HENT:     Head: Normocephalic and atraumatic.     Right Ear: External ear normal.     Left Ear: External ear normal.     Mouth/Throat:     Mouth: Mucous membranes are dry.  Eyes:     Extraocular Movements: Extraocular movements intact.     Pupils: Pupils are equal, round, and reactive to light.  Cardiovascular:     Rate and Rhythm: Normal rate. Rhythm irregular.     Pulses: Normal pulses.     Heart sounds: No murmur heard.  No friction rub. No gallop.   Pulmonary:     Effort: Pulmonary effort is normal. No respiratory distress.     Breath sounds: Normal breath sounds. No wheezing, rhonchi or rales.  Abdominal:     General: Abdomen is flat. Bowel sounds are normal. There is distension.     Palpations: Abdomen is soft.  Musculoskeletal:        General: Normal range of motion.     Right lower leg: No edema.     Left lower leg: No edema.  Skin:    General: Skin is warm and dry.     Capillary Refill: Capillary  refill takes less than 2 seconds.  Neurological:     General: No focal deficit present.     Mental Status: He is alert. Mental status is at baseline.      Assessment & Plan:   See Encounters Tab for problem based charting.  Patient seen with Dr. 

## 2020-01-12 LAB — TSH: TSH: 4.14 u[IU]/mL (ref 0.450–4.500)

## 2020-01-12 NOTE — Progress Notes (Signed)
Internal Medicine Clinic Attending  I saw and evaluated the patient.  I personally confirmed the key portions of the history and exam documented by Dr. Elaina Pattee and I reviewed pertinent patient test results.  The assessment, diagnosis, and plan were formulated together and I agree with the documentation in the resident's note.   Interesting case of a 54 year old person presenting with symptomatic exertional bradycardia. Based on our ambulatory saturations, this is not related to exertional hypoxia. The patient has a history of NICM in 2016 which has since greatly improved with medical therapy. His exam is reassuring, with no signs of volume overload or decompensation. His chest xray is clear with no signs of lung disease. Etiology of the bradycardia is unclear, maybe sick sinus syndrome. ECG at rest has a heart rate of 80 bpm, but a new finding of very frequent PVCs, even Bigeminy at times. Plan is for repeat echocardiogram to rule out recurrent structural heart disease. TSH is normal. Will stop carvedilol given the bradycardia component, this may worsen the PVC burden. Will refer him back to cardiology clinic for ambulatory monitor, perhaps treadmill ECG will help identify the bradycardia rhythm.

## 2020-01-17 ENCOUNTER — Other Ambulatory Visit: Payer: Self-pay | Admitting: Student

## 2020-01-17 ENCOUNTER — Ambulatory Visit: Payer: Self-pay | Admitting: Student

## 2020-01-17 DIAGNOSIS — F419 Anxiety disorder, unspecified: Secondary | ICD-10-CM

## 2020-01-17 DIAGNOSIS — R001 Bradycardia, unspecified: Secondary | ICD-10-CM

## 2020-01-17 DIAGNOSIS — F32A Depression, unspecified: Secondary | ICD-10-CM

## 2020-01-17 MED ORDER — HYDROXYZINE HCL 25 MG PO TABS: 25.0000 mg | ORAL_TABLET | Freq: Four times a day (QID) | ORAL | 0 refills | Status: DC | PRN

## 2020-01-17 MED ORDER — CITALOPRAM HYDROBROMIDE 10 MG PO TABS: 10.0000 mg | ORAL_TABLET | Freq: Every day | ORAL | 0 refills | Status: DC

## 2020-01-17 NOTE — Patient Instructions (Signed)
He escuchado tu corazn y tus pulmones y ambos suenan normales. No hay seales de que el lquido est aumentando en su estmago.  Hgase su ecocardiograma el 29/12/2019. Haga un seguimiento o vaya a la sala de emergencias si su dolor de Environmental consultant.  La ansiedad tambin puede dificultar su respiracin.  Tome hidroxicina 25 mg cada 6 horas segn sea necesario. No es necesario que tome hidroxicina si se siente bien. Tambin comience a tomar citalopram 10 mg al da. Haga un seguimiento en 2-4 semanas para la ansiedad.  Tambin puede volver al Aleen Campi.  Espero que empiece a sentirse Environmental health practitioner.

## 2020-01-18 NOTE — Assessment & Plan Note (Signed)
Patient presenting for continued shortness of breath that is causing him significant distress. He states he always becomes very anxious coming to doctors office and and very worried that the shortness of breath has not improved and has had difficulty sleeping due to worry and feeling more fatigued as well. On exam patient appears anxious, restless, and hyperventilating when he becomes more anxious. I believe his anxiety is contributing to worsened symptoms of shortness of breath despite improvement in his heart rate. Advised that trying to take repeated deep breaths may be causing some of his discomfort and suggested he try take smaller more regular breaths. Patient understands and somewhat reassured that his physical exam is normal at visit today and would like to try medication for his anxiety.  Plan Hydroxyzine 25 mg PRN  Citalopram 10 mg daily Follow up in 2-4 weeks to see if anxiety is improving and helps with symptoms of shortness of breath

## 2020-01-18 NOTE — Assessment & Plan Note (Signed)
Patient presents today for follow up. States his shortness of breath has worsened since last visit and now has symptoms both with exertion and at rest. States this comes and goes all day. He has stopped taking the carvedilol but has not noted much differences. He notes some associated chest discomfort with exhalation intermittently. He has not yet had a echocardiogram but has appointment for it on 01/21/2020 and have it done then. Patient is self pay and cannot afford office visit with cardiology at this time. States he continue to feel very anxious about breathing and has been eating less due to sensation of bloating when he eats and is concerned for fluid buildup in abdomen that he had during last hospitalization for CHF exacerbation.  On physical exam patient with stable vitals saturating 100% on room dair without bradycardia. Heart sounds are now regular without murmurs. No JVD or peripheral edema. Lungs are CTA without wheezing. Abdomen obese but does not appear distended no tympanic throughout and without tenderness. Does not appear fluid overloaded and weight down about 6 lbs from last visit. Appears bradycardia and PVC have improved off of carvedilol. TSH from last visit wnl.  Will need to follow up on echocardiogram. Patient appears very anxious throughout interview and anxiety may be contributing to symptoms of shortness of breath.  Plan - Follow up echocardiogram - Continue to hold carvedilol

## 2020-01-18 NOTE — Progress Notes (Signed)
   CC: SOB with exertion   HPI:  [name] is a 54 y.o. M with history documented below presents for follow up of SOB and exertional bradycardia. Please refer to problem based charting for further details and assessment and plan of current problem and chronic medical conditions.   Past Medical History:  Diagnosis Date  . Cardiac insufficiency (HCC)   . CHF (congestive heart failure) (HCC)   . Chronic back pain    "neck down" (07/20/2013)  . Chronic systolic heart failure (HCC) 06/15/2014  . GERD (gastroesophageal reflux disease)   . High cholesterol   . Hypertension   . Renal calculus   . Toxic effect of chemicals 07/10/2018   Review of Systems:  Negative as per HPI  Physical Exam:  Vitals:   01/17/20 1504  BP: 126/75  Pulse: 67  SpO2: 100%  Weight: 220 lb 6.4 oz (100 kg)   Physical Exam Constitutional:      Appearance: Normal appearance. He is obese.  HENT:     Right Ear: External ear normal.     Left Ear: External ear normal.     Nose: Nose normal.     Mouth/Throat:     Mouth: Mucous membranes are moist.     Pharynx: Oropharynx is clear.  Eyes:     Extraocular Movements: Extraocular movements intact.     Pupils: Pupils are equal, round, and reactive to light.  Cardiovascular:     Rate and Rhythm: Normal rate and regular rhythm.     Pulses: Normal pulses.     Heart sounds: Normal heart sounds. No murmur heard.      Comments: Radial pulses regular and symmetric  Pulmonary:     Effort: Pulmonary effort is normal.     Breath sounds: Normal breath sounds. No wheezing, rhonchi or rales.  Abdominal:     General: Abdomen is flat. Bowel sounds are normal. There is no distension.     Palpations: Abdomen is soft.     Tenderness: There is no abdominal tenderness. There is no rebound.  Musculoskeletal:     Cervical back: Normal range of motion and neck supple.     Right lower leg: No edema.     Left lower leg: No edema.  Neurological:     Mental Status:  He is alert.  Psychiatric:     Comments: Anxious, psychomotor agistating      Assessment & Plan:   See Encounters Tab for problem based charting.  Patient seen with Dr. Mayford Knife

## 2020-01-19 NOTE — Addendum Note (Signed)
Addended by: Neomia Dear on: 01/19/2020 05:38 PM   Modules accepted: Orders

## 2020-01-21 ENCOUNTER — Ambulatory Visit (HOSPITAL_COMMUNITY)
Admission: RE | Admit: 2020-01-21 | Discharge: 2020-01-21 | Disposition: A | Discharge status: Home / Self Care | Payer: Self-pay | Source: Ambulatory Visit | Attending: Student in an Organized Health Care Education/Training Program | Admitting: Student in an Organized Health Care Education/Training Program

## 2020-01-21 ENCOUNTER — Other Ambulatory Visit: Payer: Self-pay

## 2020-01-21 DIAGNOSIS — I5021 Acute systolic (congestive) heart failure: Secondary | ICD-10-CM | POA: Insufficient documentation

## 2020-01-21 DIAGNOSIS — R001 Bradycardia, unspecified: Secondary | ICD-10-CM | POA: Insufficient documentation

## 2020-01-21 LAB — ECHOCARDIOGRAM COMPLETE
Area-P 1/2: 3.54 cm2
Calc EF: 57.1 %
S' Lateral: 3.9 cm
Single Plane A2C EF: 56.6 %
Single Plane A4C EF: 57.8 %

## 2020-01-21 NOTE — Progress Notes (Signed)
  Echocardiogram 2D Echocardiogram has been performed.  Janalyn Harder 01/21/2020, 3:42 PM

## 2020-01-24 NOTE — Progress Notes (Signed)
Internal Medicine Clinic Attending   I saw and evaluated the patient.  I personally confirmed the key portions of the history and exam documented by Dr. Liang and I reviewed pertinent patient test results.  The assessment, diagnosis, and plan were formulated together and I agree with the documentation in the resident's note.  Phillip [name] exhibited significant anxiety today manifested by frequent deep breath/sighs, and frequent tapping the counter with his hands or opening and closing his fists.  He did endorse feeling anxious or restless inside, but couldn't attribute the feeling to any external problem.  Heart and lung and vascular exam completely normal which is reassuring.  We discussed how anxiety about his heart and lungs may be causing him to focus on his breathing which can sometimes feel uncomfortable.  He agrees that this could be the case.  VS normal today and echo is to be completed soon.  Hoping we can give him some further reassurance.  He isn't financially able to cover the copay for the cardiologist.  He agrees to counseling. 

## 2020-01-31 NOTE — Progress Notes (Signed)
Internal Medicine Clinic Attending   I saw and evaluated the patient.  I personally confirmed the key portions of the history and exam documented by Dr. Elaina Pattee and I reviewed pertinent patient test results.  The assessment, diagnosis, and plan were formulated together and I agree with the documentation in the resident's note.  [name] exhibited significant anxiety today manifested by frequent deep breath/sighs, and frequent tapping the counter with his hands or opening and closing his fists.  He did endorse feeling anxious or restless inside, but couldn't attribute the feeling to any external problem.  Heart and lung and vascular exam completely normal which is reassuring.  We discussed how anxiety about his heart and lungs may be causing him to focus on his breathing which can sometimes feel uncomfortable.  He agrees that this could be the case.  VS normal today and echo is to be completed soon.  Hoping we can give him some further reassurance.  He isn't financially able to cover the copay for the cardiologist.  He agrees to counseling.

## 2020-02-15 NOTE — Progress Notes (Signed)
Of course. And no, nothing to do, we just need to reach him and let him to see our financial counselor. I called him again. No answer.

## 2020-03-20 ENCOUNTER — Encounter: Payer: Self-pay | Admitting: Internal Medicine

## 2020-03-27 ENCOUNTER — Encounter: Payer: Self-pay | Admitting: Internal Medicine

## 2020-03-27 NOTE — Telephone Encounter (Signed)
Received a mychart message from patient (1 week ago) that he tested positive for COVID-19 on a rapid antigen test. I have asked him to schedule a telehealth appointment - please call and have him schedule. He is spanish speaking and will need a Nurse, learning disability. He is asking for advice on what to do. Please assess for symptoms and ensure appropriate isolation period. If asymptomatic / mildly symptomatic and improving, he should have already completed 5 days of isolation. If still symptomatic or worsening, please assess need for in personal evaluation (ER) and or prolonged isolation.

## 2020-03-28 ENCOUNTER — Other Ambulatory Visit: Payer: Self-pay

## 2020-03-28 ENCOUNTER — Ambulatory Visit: Payer: Self-pay | Admitting: Student

## 2020-03-28 ENCOUNTER — Encounter: Payer: Self-pay | Admitting: Student

## 2020-03-28 DIAGNOSIS — U071 COVID-19: Secondary | ICD-10-CM | POA: Insufficient documentation

## 2020-03-28 NOTE — Patient Instructions (Signed)
Thank you, Phillip [name] for allowing [place] to provide your care today. Today we discussed your Covid infection.  Since your symptoms are improving and you have not had a fever for the past 2 days, we think you can go back to work whenever you are ready.  If he starts having fevers again please continue to stay home.  It is very important that you continue to wear a mask everywhere you go.  You can take over-the-counter cough medicine to help with your cough.  I am writing you notes so you can go back to work on Thursday.  Should you have any questions or concerns please call the internal medicine clinic at [phone].    Sharrell Ku, MD, MPH [facility]   My Chart Access: [email or web address]   If you have not already done so, please get your COVID 19 vaccine  To schedule an appointment for a COVID vaccine choice any of the following: Go to [email or web address]   Go to [email or web address]                  Call [phone]                                     Call [phone] and select Option 2

## 2020-03-28 NOTE — Assessment & Plan Note (Signed)
This encounter was performed through a [place] interpreter.  Patient reports he started having cold and cough symptoms on March 19, 2020.  Symptoms included sore throat, headaches, runny nose, cough, fever, fatigue and muscle aches.  He had a rapid test at work on the 27th which came back positive. Patient reports going to the [facility] to get an additional test, and this test came back positive as well 2 days later. He has been isolated at home with his family since his positive test.  Symptoms have improved with his last fever being 2 days ago. Today, patient only endorse mild dry cough, fatigue and occasional muscle aches.  Patient states he has been drinking ginger tea to help with symptoms. He has 2 teenage boys and 1 teenage daughter that are also sick at home. They have all been isolating at home separately.  Patient has received 2 doses of the pfizer vaccine plus the booster dose on October 27. Of note, patient reports he had Covid back in June 2020 with similar symptoms except he did not have any loss of taste and smell this time.  Patient is afebrile and vital signs are stable.  Lungs are clear to auscultation bilaterally.  No wheezing or rales. Patient is 10 days away from symptom onset with significant improvement in symptoms so will clear patient to go back to work this Thursday. Patient is advised to continue to wear the mask and practice the 3W's. --Wrote a work note for patient to return to work on Thursday, January 6th.  --Encouraged to take OTC cough medications for cough.  --Advised to stay at home if he become feverish.

## 2020-03-28 NOTE — Progress Notes (Signed)
    CC: Covid infection follow-up  HPI:  [name] is a 55 y.o. male with past medical history as below presents for follow-up after Covid infection. Please see problem based charting for evaluation, assessment and plan.  Past Medical History:  Diagnosis Date  . Cardiac insufficiency (HCC)   . CHF (congestive heart failure) (HCC)   . Chronic back pain    "neck down" (07/20/2013)  . Chronic systolic heart failure (HCC) 06/15/2014  . GERD (gastroesophageal reflux disease)   . High cholesterol   . Hypertension   . Renal calculus   . Toxic effect of chemicals 07/10/2018   Review of Systems:  Constitutional: Negative for fever.  Positive for fatigue HEENT: Negative for sore throat, or runny nose.  Positive for dry cough Respiratory: Negative for shortness of breath or wheezing Cardiac: Negative for chest pain or leg swelling Abdomen: Negative for nausea or vomiting MSK: Positive for muscle aches  Physical Exam: General: Pleasant middle-age man with eye patch on the right eye. No acute distress. HEENT: Moist mucous membrane. Cardiac: Bradycardic. Regular rhythm. No murmurs, rubs or gallops. Respiratory: Lungs CTAB. No wheezing or rales Abdominal: Soft, symmetric and non tender.  Normal BS. Skin: Warm, dry and intact without rashes or lesions Neuro: A&O x 3. Moves all extremities   Vitals:   03/28/20 1451  BP: (!) 119/59  Pulse: (!) 56  Temp: 98.4 F (36.9 C)  TempSrc: Oral  SpO2: 100%     Assessment & Plan:   See Encounters Tab for problem based charting.  Patient discussed with Dr. Kelby Aline, MD, MPH

## 2020-04-04 NOTE — Progress Notes (Addendum)
Internal Medicine Clinic Attending  Case discussed with Dr. Amponsah  At the time of the visit.  We reviewed the resident's history and exam and pertinent patient test results.  I agree with the assessment, diagnosis, and plan of care documented in the resident's note.  

## 2020-06-24 ENCOUNTER — Other Ambulatory Visit (HOSPITAL_COMMUNITY): Payer: Self-pay

## 2020-06-26 ENCOUNTER — Other Ambulatory Visit: Payer: Self-pay | Admitting: Internal Medicine

## 2020-06-26 ENCOUNTER — Encounter: Payer: Self-pay | Admitting: Internal Medicine

## 2020-06-26 ENCOUNTER — Other Ambulatory Visit (HOSPITAL_COMMUNITY): Payer: Self-pay

## 2020-06-26 DIAGNOSIS — I10 Essential (primary) hypertension: Secondary | ICD-10-CM

## 2020-06-26 DIAGNOSIS — I5022 Chronic systolic (congestive) heart failure: Secondary | ICD-10-CM

## 2020-06-29 ENCOUNTER — Other Ambulatory Visit (HOSPITAL_COMMUNITY): Payer: Self-pay

## 2020-06-29 MED ORDER — ENALAPRIL MALEATE 20 MG PO TABS
20.0000 mg | ORAL_TABLET | Freq: Two times a day (BID) | ORAL | 11 refills | Status: DC
  Filled 2020-06-29: qty 60, 30d supply, fill #0
  Filled 2020-08-03: qty 60, 30d supply, fill #1
  Filled 2020-09-01: qty 60, 30d supply, fill #2
  Filled 2020-10-02: qty 60, 30d supply, fill #3
  Filled 2020-11-30: qty 60, 30d supply, fill #4
  Filled 2021-01-02: qty 60, 30d supply, fill #5
  Filled 2021-02-01: qty 60, 30d supply, fill #6
  Filled 2021-03-02: qty 60, 30d supply, fill #7
  Filled 2021-04-05: qty 60, 30d supply, fill #8

## 2020-06-29 MED ORDER — ENALAPRIL MALEATE 20 MG PO TABS
20.0000 mg | ORAL_TABLET | Freq: Two times a day (BID) | ORAL | 11 refills | Status: DC
  Filled 2020-06-29: qty 60, 30d supply, fill #0

## 2020-06-29 MED ORDER — SPIRONOLACTONE 25 MG PO TABS
25.0000 mg | ORAL_TABLET | Freq: Every day | ORAL | 11 refills | Status: DC
  Filled 2020-06-29: qty 30, 30d supply, fill #0

## 2020-06-29 MED ORDER — SPIRONOLACTONE 25 MG PO TABS
25.0000 mg | ORAL_TABLET | Freq: Every day | ORAL | 11 refills | Status: DC
  Filled 2020-06-29: qty 30, 30d supply, fill #0
  Filled 2020-08-03: qty 30, 30d supply, fill #1
  Filled 2020-09-01: qty 30, 30d supply, fill #2
  Filled 2020-10-02: qty 30, 30d supply, fill #3
  Filled 2020-11-01: qty 30, 30d supply, fill #4
  Filled 2020-11-30: qty 30, 30d supply, fill #5
  Filled 2021-01-02: qty 30, 30d supply, fill #6
  Filled 2021-02-01: qty 30, 30d supply, fill #7
  Filled 2021-03-02: qty 30, 30d supply, fill #8
  Filled 2021-04-05: qty 30, 30d supply, fill #9

## 2020-06-29 NOTE — Addendum Note (Signed)
Addended by: Burnell Blanks on: 06/29/2020 01:10 PM   Modules accepted: Orders

## 2020-07-06 ENCOUNTER — Other Ambulatory Visit (HOSPITAL_COMMUNITY): Payer: Self-pay

## 2020-08-03 ENCOUNTER — Other Ambulatory Visit (HOSPITAL_COMMUNITY): Payer: Self-pay

## 2020-09-01 ENCOUNTER — Other Ambulatory Visit (HOSPITAL_COMMUNITY): Payer: Self-pay

## 2020-09-26 ENCOUNTER — Encounter: Payer: Self-pay | Admitting: *Deleted

## 2020-10-02 ENCOUNTER — Other Ambulatory Visit (HOSPITAL_COMMUNITY): Payer: Self-pay

## 2020-10-24 ENCOUNTER — Ambulatory Visit: Payer: Self-pay

## 2020-10-24 ENCOUNTER — Other Ambulatory Visit: Payer: Self-pay

## 2020-10-24 ENCOUNTER — Other Ambulatory Visit: Payer: Self-pay | Admitting: Occupational Medicine

## 2020-10-24 DIAGNOSIS — M25572 Pain in left ankle and joints of left foot: Secondary | ICD-10-CM

## 2020-11-01 ENCOUNTER — Other Ambulatory Visit (HOSPITAL_COMMUNITY): Payer: Self-pay

## 2020-11-30 ENCOUNTER — Other Ambulatory Visit (HOSPITAL_COMMUNITY): Payer: Self-pay

## 2021-01-02 ENCOUNTER — Other Ambulatory Visit (HOSPITAL_COMMUNITY): Payer: Self-pay

## 2021-02-01 ENCOUNTER — Other Ambulatory Visit (HOSPITAL_COMMUNITY): Payer: Self-pay

## 2021-02-09 ENCOUNTER — Encounter: Payer: Self-pay | Admitting: Internal Medicine

## 2021-02-09 ENCOUNTER — Ambulatory Visit: Payer: Self-pay | Admitting: Internal Medicine

## 2021-02-09 ENCOUNTER — Other Ambulatory Visit (HOSPITAL_COMMUNITY): Payer: Self-pay

## 2021-02-09 DIAGNOSIS — R0989 Other specified symptoms and signs involving the circulatory and respiratory systems: Secondary | ICD-10-CM | POA: Insufficient documentation

## 2021-02-09 DIAGNOSIS — I10 Essential (primary) hypertension: Secondary | ICD-10-CM

## 2021-02-09 DIAGNOSIS — F32A Depression, unspecified: Secondary | ICD-10-CM

## 2021-02-09 DIAGNOSIS — F419 Anxiety disorder, unspecified: Secondary | ICD-10-CM

## 2021-02-09 MED ORDER — HYDROXYZINE HCL 25 MG PO TABS
25.0000 mg | ORAL_TABLET | Freq: Four times a day (QID) | ORAL | 2 refills | Status: DC | PRN
  Filled 2021-02-09: qty 30, 8d supply, fill #0

## 2021-02-09 MED ORDER — CITALOPRAM HYDROBROMIDE 10 MG PO TABS
10.0000 mg | ORAL_TABLET | Freq: Every day | ORAL | 3 refills | Status: DC
Start: 2021-02-09 — End: 2021-04-25
  Filled 2021-02-09: qty 30, 30d supply, fill #0

## 2021-02-09 NOTE — Patient Instructions (Addendum)
Gracias, [name] por permitirnos brindarle atencin hoy. hoy discutimos  Please pick up Robutussin DM or anything that says cold and flu and take regularly.   Please pick up a refill of your citalopram (take daily) and hydroxyzine (take only as needed).   He ordenado los siguientes laboratorios para usted:  Lab Orders  No laboratory test(s) ordered today     Pruebas ordenadas hoy:  Referencias ordenadas hoy:  Referral Orders  No referral(s) requested today     He ordenado el siguiente medicamento/cambiado los siguientes medicamentos:  Suspender los siguientes medicamentos: Medications Discontinued During This Encounter  Medication Reason   ibuprofen (ADVIL) 400 MG tablet Discontinued by provider   citalopram (CELEXA) 10 MG tablet Reorder     Iniciar los siguientes medicamentos: Meds ordered this encounter  Medications   citalopram (CELEXA) 10 MG tablet    Sig: TAKE 1 TABLET BY MOUTH ONCE DAILY.    Dispense:  30 tablet    Refill:  3    IM clinic program   hydrOXYzine (ATARAX/VISTARIL) 25 MG tablet    Sig: Take 1 tablet (25 mg total) by mouth every 6 (six) hours as needed for anxiety.    Dispense:  30 tablet    Refill:  2    IM clinic program      Seguimiento 1 mo     Si tiene [place] pregunta o inquietud, llame a la clnica de medicina interna al [phone].     Carmel Sacramento, MD  Internal Medicine Resident, PGY-1 [facility]

## 2021-02-09 NOTE — Assessment & Plan Note (Signed)
Today's Vitals   02/09/21 1017  BP: 112/73  Pulse: 83  Temp: 98.3 F (36.8 C)  TempSrc: Oral  SpO2: 99%  Weight: 221 lb 6.4 oz (100.4 kg)   BP at goal today. Pt denies CP, palpitations, leg swelling and new SHOB.   -Continue current anti-HTN medications without any changes

## 2021-02-09 NOTE — Assessment & Plan Note (Addendum)
Reports that anxiety is currently quite bothersome. Sxs include feeling uneasy, not relaxed, and difficulty sleeping. Positive PHQ-9. On exam, he appears anxious and restless. HR 83. He has been out of his hydroxyzine for several weeks to months; reports that this medication works well for him when he experiences severe anxiety. He denies taking Citalopram 10 mg which he has been prescribed in the past. Both of these medications will be refilled.   -Refill Citalopram 10 mg daily and counseled on importance of taking it daily.  -Refill Hydroxyzine 25 mg PRN  -F/u in 1 mo to re-assess anxiety

## 2021-02-09 NOTE — Progress Notes (Signed)
   CC: acute visit for "waking up not feeling well"  HPI:  [name] is a 55 y.o. male with a PMHx stated below and presents today for stated above. Please see the Encounters tab for problem-based Assessment & Plan for additional details.   Past Medical History:  Diagnosis Date   Cardiac insufficiency (HCC)    CHF (congestive heart failure) (HCC)    Chronic back pain    "neck down" (07/20/2013)   Chronic systolic heart failure (HCC) 06/15/2014   GERD (gastroesophageal reflux disease)    High cholesterol    Hypertension    Renal calculus    Toxic effect of chemicals 07/10/2018    Current Outpatient Medications on File Prior to Visit  Medication Sig Dispense Refill   carvedilol (COREG) 6.25 MG tablet TAKE 1 TABLET (6.25 MG TOTAL) BY MOUTH 2 TIMES DAILY WITH A MEAL. 60 tablet 5   enalapril (VASOTEC) 20 MG tablet Take 1 tablet (20 mg total) by mouth 2 (two) times daily. 60 tablet 11   furosemide (LASIX) 40 MG tablet Take 1 tablet (40 mg total) by mouth daily as needed. 90 tablet 1   spironolactone (ALDACTONE) 25 MG tablet Take 1 tablet (25 mg total) by mouth daily. 30 tablet 11   No current facility-administered medications on file prior to visit.    No family history on file.  Social History   Socioeconomic History   Marital status: Divorced    Spouse name: Not on file   Number of children: Not on file   Years of education: Not on file   Highest education level: Not on file  Occupational History   Not on file  Tobacco Use   Smoking status: Never   Smokeless tobacco: Never  Vaping Use   Vaping Use: Never used  Substance and Sexual Activity   Alcohol use: No    Alcohol/week: 0.0 standard drinks   Drug use: No   Sexual activity: Yes  Other Topics Concern   Not on file  Social History Narrative   Not on file   Social Determinants of Health   Financial Resource Strain: Not on file  Food Insecurity: Not on file  Transportation Needs: Not on file   Physical Activity: Not on file  Stress: Not on file  Social Connections: Not on file  Intimate Partner Violence: Not on file    Review of Systems: ROS negative except for what is noted on the assessment and plan.  Vitals:   02/09/21 1017  BP: 112/73  Pulse: 83  Temp: 98.3 F (36.8 C)  TempSrc: Oral  SpO2: 99%  Weight: 221 lb 6.4 oz (100.4 kg)     Physical Exam: Constitutional: alert, well-appearing, in NAD HENT: Atraumatic head, no palpable lymphadenopathy  Eyes: right eye socket covered by patch. Conjunctiva non-erythematous, EOMI Cardiovascular: RRR, no m/r/g, non-edematous bilateral LE Pulmonary/Chest: normal work of breathing on RA, LCTAB Abdominal: soft, non-tender to palpation, non-distended Neurological: A&O x 3, 5/5 strength in bilateral upper and lower extremities  Skin: warm and dry  Psych: restless behavior, normal affect    Assessment & Plan:   See Encounters Tab for problem based charting.  Patient seen with Dr. Hughie Closs, MD  Internal Medicine Resident, PGY-1 [facility]

## 2021-02-09 NOTE — Assessment & Plan Note (Signed)
Presenting with complaints of fever, improving cough, rhinorrhea, nasal congestion, myalgia and headaches x 4 days. Abrupt sxs onset. Reports he has not used OTC medications since sxs onset. No recent sick contact. Vaccinated against COVID and flu. No complaints of new SHOB. Constellation of sxs consistent with viral infection. Vitals wnl. Exam benign and pt in NAD. Pt is counseled to start using OTC medications including robutussin DM and cough & flu medications for symptomatic improvement. No indication to swab pt given uninsured and outcome will not change management given symptom improvement.    -Start using OTC medications for symptomatic improvement  -Call office if pt notices worsening of symptoms

## 2021-02-19 NOTE — Progress Notes (Signed)
Internal Medicine Clinic Attending  I saw and evaluated the patient.  I personally confirmed the key portions of the history and exam documented by Dr. Patel and I reviewed pertinent patient test results.  The assessment, diagnosis, and plan were formulated together and I agree with the documentation in the resident's note.  

## 2021-03-02 ENCOUNTER — Other Ambulatory Visit (HOSPITAL_COMMUNITY): Payer: Self-pay

## 2021-03-06 ENCOUNTER — Encounter: Payer: Self-pay | Admitting: *Deleted

## 2021-03-08 ENCOUNTER — Other Ambulatory Visit: Payer: Self-pay

## 2021-03-08 ENCOUNTER — Ambulatory Visit: Payer: Self-pay | Admitting: Internal Medicine

## 2021-03-08 ENCOUNTER — Encounter: Payer: Self-pay | Admitting: Internal Medicine

## 2021-03-08 VITALS — BP 127/62 | HR 60 | Temp 98.1°F | Resp 28 | Ht 67.0 in | Wt 220.5 lb

## 2021-03-08 DIAGNOSIS — Z23 Encounter for immunization: Secondary | ICD-10-CM

## 2021-03-08 DIAGNOSIS — Z0000 Encounter for general adult medical examination without abnormal findings: Secondary | ICD-10-CM

## 2021-03-08 DIAGNOSIS — F32A Depression, unspecified: Secondary | ICD-10-CM

## 2021-03-08 DIAGNOSIS — I10 Essential (primary) hypertension: Secondary | ICD-10-CM

## 2021-03-08 DIAGNOSIS — I110 Hypertensive heart disease with heart failure: Secondary | ICD-10-CM

## 2021-03-08 DIAGNOSIS — E119 Type 2 diabetes mellitus without complications: Secondary | ICD-10-CM | POA: Insufficient documentation

## 2021-03-08 DIAGNOSIS — I5022 Chronic systolic (congestive) heart failure: Secondary | ICD-10-CM

## 2021-03-08 DIAGNOSIS — F419 Anxiety disorder, unspecified: Secondary | ICD-10-CM

## 2021-03-08 LAB — POCT GLYCOSYLATED HEMOGLOBIN (HGB A1C): Hemoglobin A1C: 6.5 % — AB (ref 4.0–5.6)

## 2021-03-08 LAB — GLUCOSE, CAPILLARY: Glucose-Capillary: 113 mg/dL — ABNORMAL HIGH (ref 70–99)

## 2021-03-08 NOTE — Assessment & Plan Note (Addendum)
A1c 5.8 on 02/2015. A1c today 6.5. Lifestyle modifications discussed including diet and exercise. Pt reports understanding and is eager to lose weight and eat healthy to bring down A1c before next lab in 6 months.  Start lifestyle modifications and f/u on this during 1 mo appt A1c in 6 months  Foot exam at next visit Eye exam referral at next visit once pt obtains insurance

## 2021-03-08 NOTE — Assessment & Plan Note (Signed)
Stable. Euvolemic on exam with no LE edema and LCTAB. No JVD. Vitals stable. Weight stable; down 1 lb from visit 1 mo ago.   Continue Enalapril 20 bid and Spiro 25 mg qd

## 2021-03-08 NOTE — Assessment & Plan Note (Addendum)
Reports that his anxiety is much more controlled since prior visit 1 mo ago. Regimen includes Hydroxyzine 25 mg prn q6h and and citalopram 10 mg qd. Reports having to take Hydroxyzine 3 times this past month but states that he has not started taking citalopram as he thought it would cause addiction. Educated and counseled on citalopram use and how it must be taken daily to notice effects in the upcoming weeks. He expresses understanding and states that he will start taking it. F/u in 1 mo and inquire about anxiety and citalopram use, and potentially up-titrate citalopram to 20 mg if tolerating well. Discussed avoiding triggers.   Increase citalopram from 10 to 20 mg  Consider further up-titration during 1 mo f/u if pt continues to tolerate medication well

## 2021-03-08 NOTE — Progress Notes (Signed)
CC: 1 mo clinic f/u visit   HPI:  [name] is a 55 y.o. male with a PMHx stated below and presents today for stated above. Please see the Encounters tab for problem-based Assessment & Plan for additional details.   Past Medical History:  Diagnosis Date   Cardiac insufficiency (HCC)    CHF (congestive heart failure) (HCC)    Chronic back pain    "neck down" (07/20/2013)   Chronic systolic heart failure (HCC) 06/15/2014   GERD (gastroesophageal reflux disease)    High cholesterol    Hypertension    Renal calculus    Toxic effect of chemicals 07/10/2018    Current Outpatient Medications on File Prior to Visit  Medication Sig Dispense Refill   carvedilol (COREG) 6.25 MG tablet TAKE 1 TABLET (6.25 MG TOTAL) BY MOUTH 2 TIMES DAILY WITH A MEAL. 60 tablet 5   citalopram (CELEXA) 10 MG tablet Take 1 tablet (10 mg total) by mouth daily. 30 tablet 3   enalapril (VASOTEC) 20 MG tablet Take 1 tablet (20 mg total) by mouth 2 (two) times daily. 60 tablet 11   furosemide (LASIX) 40 MG tablet Take 1 tablet (40 mg total) by mouth daily as needed. 90 tablet 1   hydrOXYzine (ATARAX/VISTARIL) 25 MG tablet Take 1 tablet (25 mg total) by mouth every 6 (six) hours as needed for anxiety. 30 tablet 2   spironolactone (ALDACTONE) 25 MG tablet Take 1 tablet (25 mg total) by mouth daily. 30 tablet 11   No current facility-administered medications on file prior to visit.    No family history on file.  Social History   Socioeconomic History   Marital status: Divorced    Spouse name: Not on file   Number of children: Not on file   Years of education: Not on file   Highest education level: Not on file  Occupational History   Not on file  Tobacco Use   Smoking status: Never   Smokeless tobacco: Never  Vaping Use   Vaping Use: Never used  Substance and Sexual Activity   Alcohol use: No    Alcohol/week: 0.0 standard drinks   Drug use: No   Sexual activity: Yes  Other Topics Concern    Not on file  Social History Narrative   Not on file   Social Determinants of Health   Financial Resource Strain: Not on file  Food Insecurity: Not on file  Transportation Needs: Not on file  Physical Activity: Not on file  Stress: Not on file  Social Connections: Not on file  Intimate Partner Violence: Not on file    Review of Systems: ROS negative except for what is noted on the assessment and plan.  Vitals:   03/08/21 1036  BP: 127/62  Pulse: 60  Resp: (!) 28  Temp: 98.1 F (36.7 C)  TempSrc: Oral  SpO2: 100%  Weight: 220 lb 8 oz (100 kg)  Height: 5\' 7"  (1.702 m)     Physical Exam: Constitutional: alert, well-appearing, in NAD Cardiovascular: RRR, no m/r/g, non-edematous bilateral LE Pulmonary/Chest: normal work of breathing on RA, LCTAB Abdominal: soft, non-tender to palpation, non-distended MSK: normal bulk and tone  Neurological: A&O x 3, 5/5 strength in bilateral upper and lower extremities  Skin: warm and dry  Psych: normal behavior, normal affect    Assessment & Plan:   See Encounters Tab for problem based charting.  Patient seen with Dr. , MD  Internal Medicine Resident, PGY-1 [facility] Mates  [facility] ;

## 2021-03-08 NOTE — Assessment & Plan Note (Addendum)
Pneumococcal vaccine given Colonoscopy referral offered, pt deferred to Jan once he obtains insurance  Pt advised to get Shingrex at pharmacy

## 2021-03-08 NOTE — Assessment & Plan Note (Signed)
Stable. BP 127/62, was 112/73 during prior visit. Remains at goal on current regimen.   Continues Enalapril 20 mg bid, Spiro 25 mg qd, and Coreg 12.5 bid

## 2021-03-08 NOTE — Patient Instructions (Signed)
Please start taking citalopram everyday  You can take Zyrtec for allergies if needed   Please start walking everyday and losing weight

## 2021-03-09 NOTE — Progress Notes (Signed)
Internal Medicine Clinic Attending  I saw and evaluated the patient.  I personally confirmed the key portions of the history and exam documented by Dr. Patel and I reviewed pertinent patient test results.  The assessment, diagnosis, and plan were formulated together and I agree with the documentation in the resident's note.  

## 2021-03-28 NOTE — Congregational Nurse Program (Signed)
°  Dept: [phone]   Congregational Nurse Program Note  Date of Encounter: 03/06/2021  Past Medical History: Past Medical History:  Diagnosis Date   Cardiac insufficiency (HCC)    CHF (congestive heart failure) (HCC)    Chronic back pain    "neck down" (07/20/2013)   Chronic systolic heart failure (HCC) 06/15/2014   GERD (gastroesophageal reflux disease)    High cholesterol    Hypertension    Renal calculus    Toxic effect of chemicals 07/10/2018    Encounter Details:  CNP Questionnaire - 03/06/21 1659       Questionnaire   Do you give verbal consent to treat you today? Yes    Location Patient Served  [facility]    Visit Setting [place] or Organization    Patient Status Immigrant    Insurance Uninsured ([place] [employer]s/Self-Pay)    Insurance Referral Affordable Care (ACA)    Medication Have Medication Insecurities    Medical Provider Yes    Screening Referrals N/A    Medical Referral N/A    Medical Appointment Made N/A    Food N/A    Transportation N/A    Housing/Utilities N/A    Interpersonal Safety N/A    Intervention Blood pressure    ED Visit Averted Yes    Life-Saving Intervention Made N/A            Client came into [place] spirit requesting assistance with [employer], retail.  Interpreter used and client assisted with calling to get insurance.  Client spoke with interpreter and took notes.  Client related that he just needed to make a decision about which insurance to purchase.  Client has phone number and information to proceed on his own.  Will follow up with client as requested.  Roderic Palau, RN, MSN, CNP [phone] Office [phone] Cell

## 2021-04-05 ENCOUNTER — Other Ambulatory Visit (HOSPITAL_COMMUNITY): Payer: Self-pay

## 2021-04-25 ENCOUNTER — Encounter: Payer: Self-pay | Admitting: Internal Medicine

## 2021-04-25 ENCOUNTER — Ambulatory Visit (INDEPENDENT_AMBULATORY_CARE_PROVIDER_SITE_OTHER): Payer: Managed Care, Other (non HMO) | Admitting: Internal Medicine

## 2021-04-25 ENCOUNTER — Ambulatory Visit (HOSPITAL_COMMUNITY)
Admission: RE | Admit: 2021-04-25 | Discharge: 2021-04-25 | Disposition: A | Discharge status: Home / Self Care | Payer: Commercial Managed Care - HMO | Source: Ambulatory Visit | Attending: Internal Medicine | Admitting: Internal Medicine

## 2021-04-25 ENCOUNTER — Other Ambulatory Visit: Payer: Self-pay

## 2021-04-25 ENCOUNTER — Other Ambulatory Visit (HOSPITAL_COMMUNITY): Payer: Self-pay

## 2021-04-25 VITALS — BP 134/79 | HR 66 | Temp 98.2°F | Ht 68.0 in | Wt 221.4 lb

## 2021-04-25 DIAGNOSIS — R001 Bradycardia, unspecified: Secondary | ICD-10-CM

## 2021-04-25 DIAGNOSIS — I5022 Chronic systolic (congestive) heart failure: Secondary | ICD-10-CM | POA: Diagnosis not present

## 2021-04-25 DIAGNOSIS — Z947 Corneal transplant status: Secondary | ICD-10-CM

## 2021-04-25 DIAGNOSIS — I428 Other cardiomyopathies: Secondary | ICD-10-CM

## 2021-04-25 DIAGNOSIS — I110 Hypertensive heart disease with heart failure: Secondary | ICD-10-CM | POA: Diagnosis not present

## 2021-04-25 DIAGNOSIS — F419 Anxiety disorder, unspecified: Secondary | ICD-10-CM

## 2021-04-25 DIAGNOSIS — I493 Ventricular premature depolarization: Secondary | ICD-10-CM | POA: Diagnosis not present

## 2021-04-25 DIAGNOSIS — E785 Hyperlipidemia, unspecified: Secondary | ICD-10-CM

## 2021-04-25 DIAGNOSIS — I10 Essential (primary) hypertension: Secondary | ICD-10-CM

## 2021-04-25 DIAGNOSIS — F32A Depression, unspecified: Secondary | ICD-10-CM

## 2021-04-25 DIAGNOSIS — E119 Type 2 diabetes mellitus without complications: Secondary | ICD-10-CM

## 2021-04-25 MED ORDER — CITALOPRAM HYDROBROMIDE 10 MG PO TABS
10.0000 mg | ORAL_TABLET | Freq: Every day | ORAL | 11 refills | Status: DC
  Filled 2021-04-25 – 2021-05-03 (×2): qty 30, 30d supply, fill #0
  Filled 2021-06-05: qty 30, 30d supply, fill #1
  Filled 2021-07-09: qty 30, 30d supply, fill #2

## 2021-04-25 MED ORDER — SPIRONOLACTONE 25 MG PO TABS
25.0000 mg | ORAL_TABLET | Freq: Every day | ORAL | 11 refills | Status: DC
  Filled 2021-04-25 – 2021-05-03 (×2): qty 30, 30d supply, fill #0
  Filled 2021-06-05: qty 30, 30d supply, fill #1
  Filled 2021-07-09: qty 30, 30d supply, fill #2
  Filled 2021-08-09: qty 30, 30d supply, fill #3
  Filled 2021-09-10: qty 90, 90d supply, fill #4

## 2021-04-25 MED ORDER — FUROSEMIDE 40 MG PO TABS
40.0000 mg | ORAL_TABLET | Freq: Every day | ORAL | 11 refills | Status: DC | PRN
Start: 2021-04-25 — End: 2021-11-14
  Filled 2021-04-25 – 2021-05-03 (×2): qty 30, 30d supply, fill #0
  Filled 2021-06-05: qty 30, 30d supply, fill #1
  Filled 2021-07-09: qty 30, 30d supply, fill #2
  Filled 2021-08-09: qty 30, 30d supply, fill #3
  Filled 2021-09-10: qty 90, 90d supply, fill #4

## 2021-04-25 MED ORDER — ENALAPRIL MALEATE 20 MG PO TABS
20.0000 mg | ORAL_TABLET | Freq: Two times a day (BID) | ORAL | 11 refills | Status: DC
  Filled 2021-04-25 – 2021-05-03 (×2): qty 60, 30d supply, fill #0
  Filled 2021-06-05: qty 60, 30d supply, fill #1
  Filled 2021-07-09: qty 60, 30d supply, fill #2
  Filled 2021-08-09: qty 60, 30d supply, fill #3
  Filled 2021-09-10: qty 60, 30d supply, fill #4
  Filled 2021-09-10: qty 180, 90d supply, fill #4

## 2021-04-25 NOTE — Assessment & Plan Note (Signed)
Diet controlled, hemoglobin A1c today 6.5.  Continue to monitor, repeat hemoglobin A1c in 6 months.

## 2021-04-25 NOTE — Progress Notes (Addendum)
Subjective:   Patient ID: Phillip [name] male   DOB: 1965/11/01 56 y.o.   MRN: [id number]  HPI: [name] is a 56 y.o. male with past medical history outlined below here for follow up of bradycardia. For the details of today's visit, please refer to the assessment and plan.  Past Medical History:  Diagnosis Date   Cardiac insufficiency (HCC)    CHF (congestive heart failure) (HCC)    Chronic back pain    "neck down" (07/20/2013)   Chronic systolic heart failure ([place]) 06/15/2014   GERD (gastroesophageal reflux disease)    High cholesterol    Hypertension    Renal calculus    Toxic effect of chemicals 07/10/2018   Current Outpatient Medications  Medication Sig Dispense Refill   citalopram (CELEXA) 10 MG tablet Take 1 tablet (10 mg total) by mouth daily. 30 tablet 11   enalapril (VASOTEC) 20 MG tablet Take 1 tablet (20 mg total) by mouth 2 (two) times daily. 60 tablet 11   furosemide (LASIX) 40 MG tablet Take 1 tablet (40 mg total) by mouth daily as needed. 30 tablet 11   spironolactone (ALDACTONE) 25 MG tablet Take 1 tablet (25 mg total) by mouth daily. 30 tablet 11   No current facility-administered medications for this visit.   No family history on file. Social History   Socioeconomic History   Marital status: Divorced    Spouse name: Not on file   Number of children: Not on file   Years of education: Not on file   Highest education level: Not on file  Occupational History   Not on file  Tobacco Use   Smoking status: Never   Smokeless tobacco: Never  Vaping Use   Vaping Use: Never used  Substance and Sexual Activity   Alcohol use: No    Alcohol/week: 0.0 standard drinks   Drug use: No   Sexual activity: Yes  Other Topics Concern   Not on file  Social History Narrative   Not on file   Social Determinants of Health   Financial Resource Strain: Not on file  Food Insecurity: Not on file  Transportation Needs: Not on file  Physical Activity:  Not on file  Stress: Not on file  Social Connections: Not on file    Review of Systems: Review of Systems  Respiratory:  Positive for shortness of breath.   Cardiovascular:  Negative for chest pain.  Gastrointestinal:  Negative for abdominal pain, constipation and diarrhea.       Normal / regular BM 2-3 x a day. Sometimes soft but denies diarrhea.     Objective:  Physical Exam:  Vitals:   04/25/21 0905 04/25/21 0957  BP: (!) 150/63 134/79  Pulse: (!) 39 66  Temp: 98.2 F (36.8 C)   TempSrc: Oral   SpO2: 100%   Weight: 221 lb 6.4 oz (100.4 kg)   Height: 5\' 8"  (1.727 m)     Physical Exam Constitutional:      Appearance: Normal appearance.  Cardiovascular:     Rate and Rhythm: Bradycardia present. Rhythm irregular.     Pulses: Normal pulses.  Pulmonary:     Effort: Pulmonary effort is normal.     Breath sounds: Normal breath sounds.  Abdominal:     General: Abdomen is flat. Bowel sounds are normal.     Palpations: Abdomen is soft.     Tenderness: There is no abdominal tenderness.  Musculoskeletal:     Right lower leg: No edema.  Left lower leg: No edema.  Skin:    General: Skin is warm and dry.  Neurological:     Mental Status: He is alert.     Assessment & Plan:   Non-ischemic cardiomyopathy [facility]) Patient has a history of HTN and nonischemic cardiomyopathy. He from [place] and moved to the [place] in 2014. He has chronic symptoms of intermittent chest pain and SOB for which he has had multiple ED / visits hospitalization over the years and undergone significant cardiac work up. He had a myoview stress test at [facility] on 11/17/2012 that showed moderate transient ischemic changes with an EF of 38%. He has had two left heart catheterizations on 11/17/2012 and again on 05/26/2014 which showed normal coronary arteries. Etiology of his heart failure is unclear but felt possibly 2/2 HTN. On chart review, Chagas disease had been considered by his cardiologist at [facility] and cardiac MRI  was ordered, however he was unable to tolerate this due to severe claustrophobia despite valium and the study was aborted. He was last seen by cardiology here at [facility] (Dr. Haroldine Laws) on 09/12/2015. EF had normalized with medical management. Per review of cards notes, Chagas disease was felt to be less likely due to lack of cardiac arrhythmias at the time.  Patient lost his medical insurance and has been lost to follow up with cardiology since then. But has continued to follow with our clinic for his diabetes and hypertension. He began having issues with symptomatic bradycardia last year. At the time, his heart rate was noted to be normal at rest but decreased to the 30s with ambulation. EKG showed sinus bradycardia with PCVs and his coreg was discontinued. Repeat echocardiogram showed stable / normal EF of 55-60% and no significant structural disease.   He is here today for follow up. Initial heart rate on arrival was 39 (confirmed with manual check) but improved to 66 after resting.  He was symptomatic with shortness of breath but denied chest pain or dizziness.  EKG obtained showed normal sinus rhythm with PVCs, and normal PR interval; overall consistent with prior EKG tracings. QRS is duration is normal however slightly prolonged compared to prior (88 -> 102). He is currently taking enalopril, spironolactone, and lasix. He has not taken coreg since it was discontinued last year.   Overall I am concerned for Chagas cardiomyopathy given his history of NICM and new symptomatic bradyarrhythmia. I did ambulate patient again today and he was able to maintain appropriate HR (up to 90s) so unclear if this is truely chronotropic incompetence. But regardless his intermittent bradycardia with PVCs are concerning. There is no evidence of high degree heart block on EKG but do think he would benefit from outpatient cardiac monitoring. I have ordered T. Cruzi antibodies and discussed with Dr. Linus Salmons with infectious disease.  Recommending ambulatory referral, will follow up antibody results and consider treatment through the CDC.  -- F/u T. Cruzi Abs -- Amb referral to cards to re-establish; consider outpatient cardiac monitoring  -- Amb referral to ID; concern for Chagas disease, to see Dr. Linus Salmons if possible  -- Continue current medical therapy with enalapril, spironolactone, and lasix. Checking BMP. Continue holding beta blocker.   Type 2 diabetes mellitus (HCC) Diet controlled, hemoglobin A1c today 6.5.  Continue to monitor, repeat hemoglobin A1c in 6 months.  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  Previously following with ophthalmology at [facility]  but has been lost to follow-up since 2016 after losing his medical insurance.  I have placed a new referral for him to reestablish.   HTN (hypertension) Chronic and well-controlled on enalapril 20 mg BID, spironolactone 25 mg daily, and Lasix 40 mg daily PRN.  Checking BMP  Hyperlipidemia Last lipid panel checked in 2020 with and LDL of 82. Diabetic, not currently on a statin. Rechecking lipid panel today.   Anxiety and depression Symptoms much improved since starting Celexa 10 mg daily.  Continue, refills sent to pharmacy.  Bradycardia See "nonischemic cardiomyopathy, A&P".  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  Also referred to infectious disease, I have discussed with Dr. Linus Salmons.

## 2021-04-25 NOTE — Assessment & Plan Note (Signed)
Last lipid panel checked in 2020 with and LDL of 82. Diabetic, not currently on a statin. Rechecking lipid panel today.

## 2021-04-25 NOTE — Assessment & Plan Note (Signed)
Symptoms much improved since starting Celexa 10 mg daily.  Continue, refills sent to pharmacy.

## 2021-04-25 NOTE — Assessment & Plan Note (Signed)
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  Previously following with ophthalmology at [facility] but has been lost to follow-up since 2016 after losing his medical insurance.  I have placed a new referral for him to reestablish.

## 2021-04-25 NOTE — Assessment & Plan Note (Signed)
See "nonischemic cardiomyopathy, A&P".  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  Also referred to infectious disease, I have discussed with Dr. Luciana Axe.

## 2021-04-25 NOTE — Patient Instructions (Signed)
[name],  It was a pleasure to see you. I have ordered a blood test to check for Chaga's disease. This will take a few days to come back but I will call you with the results. I have placed a referral for you to see the cardiologist and ophthalmologist again - you will be called you schedule these appointments. Please continue to take all of your medications as previously prescribed.   Follow up with me again in person in 2 months. If you have any questions or concerns, call our clinic at [phone] or after hours call [phone] and ask for the internal medicine resident on call.   Thank you!  Dr. Darnell Level

## 2021-04-25 NOTE — Assessment & Plan Note (Addendum)
Patient has a history of HTN and nonischemic cardiomyopathy. He from [place] and moved to the [place] in 2014. He has chronic symptoms of intermittent chest pain and SOB for which he has had multiple ED / visits hospitalization over the years and undergone significant cardiac work up. He had a myoview stress test at [facility] on 11/17/2012 that showed moderate transient ischemic changes with an EF of 38%. He has had two left heart catheterizations on 11/17/2012 and again on 05/26/2014 which showed normal coronary arteries. Etiology of his heart failure is unclear but felt possibly 2/2 HTN. On chart review, Chagas disease had been considered by his cardiologist at [facility] and cardiac MRI was ordered, however he was unable to tolerate this due to severe claustrophobia despite valium and the study was aborted. He was last seen by cardiology here at [facility] (Dr. Gala Romney) on 09/12/2015. EF had normalized with medical management. Per review of cards notes, Chagas disease was felt to be less likely due to lack of cardiac arrhythmias at the time.  Patient lost his medical insurance and has been lost to follow up with cardiology since then. But has continued to follow with our clinic for his diabetes and hypertension. He began having issues with symptomatic bradycardia last year. At the time, his heart rate was noted to be normal at rest but decreased to the 30s with ambulation. EKG showed sinus bradycardia with PCVs and his coreg was discontinued. Repeat echocardiogram showed stable / normal EF of 55-60% and no significant structural disease.   He is here today for follow up. Initial heart rate on arrival was 39 (confirmed with manual check) but improved to 66 after resting.  He was symptomatic with shortness of breath but denied chest pain or dizziness.  EKG obtained showed normal sinus rhythm with PVCs, and normal PR interval; overall consistent with prior EKG tracings. QRS is duration is normal however slightly prolonged compared to  prior (88 -> 102). He is currently taking enalopril, spironolactone, and lasix. He has not taken coreg since it was discontinued last year.   Overall I am concerned for Chagas cardiomyopathy given his history of NICM and new symptomatic bradyarrhythmia. I did ambulate patient again today and he was able to maintain appropriate HR (up to 90s) so unclear if this is truely chronotropic incompetence. But regardless his intermittent bradycardia with PVCs are concerning. There is no evidence of high degree heart block on EKG but do think he would benefit from outpatient cardiac monitoring. I have ordered T. Cruzi antibodies and discussed with Dr. Luciana Axe with infectious disease. Recommending ambulatory referral, will follow up antibody results and consider treatment through the CDC.  -- F/u T. Cruzi Abs -- Amb referral to cards to re-establish; consider outpatient cardiac monitoring  -- Amb referral to ID; concern for Chagas disease, to see Dr. Luciana Axe if possible  -- Continue current medical therapy with enalapril, spironolactone, and lasix. Checking BMP. Continue holding beta blocker.

## 2021-04-25 NOTE — Assessment & Plan Note (Signed)
Chronic and well-controlled on enalapril 20 mg BID, spironolactone 25 mg daily, and Lasix 40 mg daily PRN.  Checking BMP

## 2021-04-26 LAB — BMP8+ANION GAP
Anion Gap: 15 mmol/L (ref 10.0–18.0)
BUN/Creatinine Ratio: 18 (ref 9–20)
BUN: 17 mg/dL (ref 6–24)
CO2: 22 mmol/L (ref 20–29)
Calcium: 9.4 mg/dL (ref 8.7–10.2)
Chloride: 100 mmol/L (ref 96–106)
Creatinine, Ser: 0.93 mg/dL (ref 0.76–1.27)
Glucose: 92 mg/dL (ref 70–99)
Potassium: 4.6 mmol/L (ref 3.5–5.2)
Sodium: 137 mmol/L (ref 134–144)
eGFR: 95 mL/min/{1.73_m2} (ref >59)

## 2021-04-30 ENCOUNTER — Encounter: Payer: Self-pay | Admitting: Internal Medicine

## 2021-04-30 LAB — LIPID PANEL

## 2021-05-01 ENCOUNTER — Ambulatory Visit (INDEPENDENT_AMBULATORY_CARE_PROVIDER_SITE_OTHER): Payer: Managed Care, Other (non HMO) | Admitting: Student

## 2021-05-01 ENCOUNTER — Encounter: Payer: Self-pay | Admitting: Internal Medicine

## 2021-05-01 DIAGNOSIS — I10 Essential (primary) hypertension: Secondary | ICD-10-CM

## 2021-05-01 DIAGNOSIS — E785 Hyperlipidemia, unspecified: Secondary | ICD-10-CM | POA: Diagnosis not present

## 2021-05-01 DIAGNOSIS — R42 Dizziness and giddiness: Secondary | ICD-10-CM | POA: Diagnosis not present

## 2021-05-01 DIAGNOSIS — R001 Bradycardia, unspecified: Secondary | ICD-10-CM | POA: Diagnosis not present

## 2021-05-01 LAB — LIPID PANEL
Chol/HDL Ratio: 5.3 ratio — ABNORMAL HIGH (ref 0.0–5.0)
Cholesterol, Total: 197 mg/dL (ref 100–199)
HDL: 37 mg/dL — ABNORMAL LOW (ref >39)
LDL Chol Calc (NIH): 117 mg/dL — ABNORMAL HIGH (ref 0–99)
Triglycerides: 245 mg/dL — ABNORMAL HIGH (ref 0–149)
VLDL Cholesterol Cal: 43 mg/dL — ABNORMAL HIGH (ref 5–40)

## 2021-05-01 LAB — SPECIMEN STATUS REPORT

## 2021-05-01 NOTE — Assessment & Plan Note (Addendum)
Patient was seen in clinic 1 week ago for similar symptoms of lightheadedness/dizziness.  Please see Dr. Jule Ser note from 04/25/2021 under "nonischemic cardiomyopathy, A&P" for full details.  Briefly, patient is originally from [place] and moved to the [place] in 2014.  Has a history of hypertension and NICM.  He had a Myoview stress test in 2014 that showed moderate transient ischemic changes with an EF of 38%.  Had 2 left heart catheterizations, 1 in 2014 and 1 in 2016, both showing normal coronary arteries.  His ejection fraction normalized with medical management.  Over the past year, he began complaining of symptoms consistent with symptomatic bradycardia with heart rates noted to be decreased to the 30s with ambulation at times.  Repeat echo showed stable/normal EF of 55 to 60% with no significant structural disease.  Last week, on arrival heart rate was 39 but improved to 66 with rest.  He was symptomatic at that time with shortness of breath only.  EKG showed normal sinus rhythm with PVCs but was relatively unchanged from priors.  There is concern for possible Chagas cardiomyopathy given history of NICM and new symptomatic bradycardia.  T. Cruzi antibodies were collected and he was referred to cardiology at that time, but there was miscommunication about appointment date and thus patient missed his appointment.  Presented to clinic today with episodic lightheadedness/dizziness.  States that he has had several episodes since last visit where he notes himself to become dizzy intermittently.  This is usually accompanied with mild blurring of his vision.  States that 1 episode occurred when he was on a bus to go to work while another occurred when he was trying to stand up from a seated position at home.  Denies any loss of consciousness, chest pain, palpitations, shortness of breath, headache, fevers, chills.  Overall, he notes that the symptoms feel similar to his prior episodes.  Orthostatic vitals are as  follows: lying BP 117/61 HR 40, sitting BP 120/63 HR 40, standing BP 109/59 HR 64.  Currently asymptomatic.  Overall, patient's current presentation appears to be consistent with symptomatic bradycardia.  We were able to contact [facility] to reschedule patient's appointment to this Friday morning at 10:40 AM with Dr. Antoine Poche for further evaluation and consideration of outpatient cardiac monitoring.  Antibody testing for Chagas is still pending.  We will continue current medical therapy with enalapril, spironolactone, Lasix.  Plan: -Follow-up with cardiology this Friday -Follow-up T. Cruzi antibodies -Continue enalapril, spironolactone, Lasix -Advised patient to seek care should he have worsening symptoms -Follow-up with [facility] in 1 month

## 2021-05-01 NOTE — Assessment & Plan Note (Signed)
Blood pressure chronic and well-controlled with enalapril 20 mg twice daily, Lasix 40 mg daily as needed, spironolactone 25 mg daily.

## 2021-05-01 NOTE — Patient Instructions (Addendum)
[name],  Fue un placer verte en la clnica hoy.   1. Es muy importante que acudas a tu cita con Cardiologa este viernes por la [employer]rn ver por qu su ritmo cardaco est bajando y tratar de Neurosurgeon por qu tiene mareos. 2. He proporcionado una nota de trabajo para usted. 3. Por favor, vuelva a vernos en 1 mes.   Llame a nuestra clnica al [phone] si tiene alguna pregunta o inquietud. El mejor momento para llamar es de lunes a viernes de 9 a.m. a 4 p.m., pero hay alguien disponible las 24 horas, los 7 das de la semana en el mismo nmero. Si necesita resurtir [employer], notifique a su farmacia con una semana de anticipacin y nos enviarn una solicitud.   Gracias por permitirnos participar en su cuidado. Esperamos verte la prxima vez!

## 2021-05-01 NOTE — Progress Notes (Signed)
° °  CC: Lightheadedness/dizziness  HPI:  Video interpreter used during encounter.  [name] is a 56 y.o. male with history listed below presenting to the [facility] for lightheadedness/dizziness. Please see individualized problem based charting for full HPI.  Past Medical History:  Diagnosis Date   Cardiac insufficiency (HCC)    CHF (congestive heart failure) (HCC)    Chronic back pain    "neck down" (07/20/2013)   Chronic systolic heart failure ([place]) 06/15/2014   GERD (gastroesophageal reflux disease)    High cholesterol    Hypertension    Renal calculus    Toxic effect of chemicals 07/10/2018    Review of Systems:  Negative aside from that listed in individualized problem based charting.  Physical Exam:  Vitals:   05/01/21 1037  SpO2: 98%  Weight: 220 lb 8 oz (100 kg)   Orthostatic VS for the past 24 hrs (Last 3 readings):  BP- Lying Pulse- Lying BP- Sitting Pulse- Sitting BP- Standing at 0 minutes Pulse- Standing at 0 minutes  05/01/21 1037 117/61 (!) 40 120/63 (!) 40 109/59 64     Physical Exam Constitutional:      Appearance: He is obese. He is not ill-appearing.  HENT:     Mouth/Throat:     Mouth: Mucous membranes are moist.     Pharynx: Oropharynx is clear. No oropharyngeal exudate.  Eyes:     Extraocular Movements: Extraocular movements intact.     Conjunctiva/sclera: Conjunctivae normal.     Pupils: Pupils are equal, round, and reactive to light.  Cardiovascular:     Rate and Rhythm: Regular rhythm. Bradycardia present.     Pulses: Normal pulses.     Heart sounds: Normal heart sounds. No murmur heard.   No gallop.  Pulmonary:     Effort: Pulmonary effort is normal.     Breath sounds: Normal breath sounds. No wheezing, rhonchi or rales.  Abdominal:     General: Bowel sounds are normal. There is no distension.     Palpations: Abdomen is soft.     Tenderness: There is no abdominal tenderness.  Musculoskeletal:        General: No swelling. Normal  range of motion.  Skin:    General: Skin is warm and dry.  Neurological:     General: No focal deficit present.     Mental Status: He is alert and oriented to person, place, and time.  Psychiatric:        Mood and Affect: Mood normal.        Behavior: Behavior normal.     Assessment & Plan:   See Encounters Tab for problem based charting.  Patient discussed with Dr. Evette Doffing

## 2021-05-01 NOTE — Assessment & Plan Note (Signed)
Patient with history of HTN and T2DM. Lipid panel last week showing total cholesterol 245, HDL 37, LDL 117. ASCVD risk of 17.4%, not currently on a statin. Did not discuss this during current visit due to acute visit for symptomatic bradycardia. Will discuss initiation of statin therapy at next in-person visit.  Plan: -discuss initiation of statin therapy at next in-person visit

## 2021-05-02 LAB — TRYPANOSOMA CRUZI AB IGG: T cruzi IgG: 0.3 IV (ref <=1.0)

## 2021-05-02 NOTE — Progress Notes (Signed)
Internal Medicine Clinic Attending  Case discussed with Dr. Jinwala  At the time of the visit.  We reviewed the resident's history and exam and pertinent patient test results.  I agree with the assessment, diagnosis, and plan of care documented in the resident's note.  

## 2021-05-03 ENCOUNTER — Other Ambulatory Visit (HOSPITAL_COMMUNITY): Payer: Self-pay

## 2021-05-03 ENCOUNTER — Encounter: Payer: Self-pay | Admitting: Cardiology

## 2021-05-03 DIAGNOSIS — I5022 Chronic systolic (congestive) heart failure: Secondary | ICD-10-CM | POA: Insufficient documentation

## 2021-05-03 NOTE — Progress Notes (Signed)
Cardiology Office Note   Date:  05/04/2021   ID:  Phillip [name], DOB 12/31/1965, MRN [id number]  PCP:  Reymundo Poll, MD  Cardiologist:   None Referring:  Reymundo Poll, MD  Chief Complaint  Patient presents with   Dizziness      History of Present Illness: Phillip [name] is a 56 y.o. male who presents for follow up of cardiomyopathy.   He is referred by Reymundo Poll, MD.  He has been hypertensive since age 87.  In 2014, he apparently had chest pain and had a stress PET at [facility].  Cardiac cath at that time did not show obstructive disease.  He had an echo here in 06/2013 that showed normal EF, 55-60%.  In 2/16, he was admitted twice with chest pain.  Both times, symptoms were thought to be atypical for ischemia. During the first admission, there was one troponin of 0.19 while others were normal.  During the second admission, there was one troponin to 0.05 while the others were normal.  Echo was done in 2/16, showing EF down to 30-35% with inferior and inferoseptal wall motion abnormality.  He underwent coronary angiography on 05/26/14 with normal coronaries, LVEF 35-40%  and LVEDP 26. Lasix was increased.    After cMRi was ordered to look for possible Chagas CM. But he could not complete due to severe claustrophobia even with valium. Echo in 2021 demonstrated NL EF .  There were no valvular abnormalities.    He is referred back by the primary care provider.  He has been having some increasing shortness of breath.  He says really this has been chronic with exertion such as climbing up a flight of stairs.  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  He will get short of breath with this kind of activity.  He does have a little shortness of breath sometimes at rest but he is not really describing PND exam.  He does not really notice his palpitations but he does have ectopy on  EKG.  He does have dizziness and had a little bit of presyncope which is really why he went to his primary care provider.  He has not had any frank syncope.  He is not really describing chest pressure, neck or arm discomfort.  He has had some slow weight gain over time.  He is not really describing edema.   Past Medical History:  Diagnosis Date   Chronic back pain    "neck down" (07/20/2013)   Chronic systolic heart failure (HCC) 06/15/2014   GERD (gastroesophageal reflux disease)    High cholesterol    Hypertension    Renal calculus     Past Surgical History:  Procedure Laterality Date   CARDIAC CATHETERIZATION  2014?   CATARACT EXTRACTION W/ INTRAOCULAR LENS IMPLANT Right 02/16/2014   CORNEAL TRANSPLANT Right ~ 2010   EYE SURGERY Right    LEFT HEART CATHETERIZATION WITH CORONARY ANGIOGRAM N/A 05/26/2014   Procedure: LEFT HEART CATHETERIZATION WITH CORONARY ANGIOGRAM;  Surgeon: Laurey Morale, MD;  Location: [facility] CATH LAB;  Service: Cardiovascular;  Laterality: N/A;   RADIOLOGY WITH ANESTHESIA N/A 03/27/2019   Procedure: MRI WITH ANESTHESIA;  Surgeon: Radiologist, Medication, MD;  Location: [facility] OR;  Service: Radiology;  Laterality: N/A;     Current Outpatient Medications  Medication Sig Dispense Refill   citalopram (CELEXA) 10 MG  tablet Take 1 tablet (10 mg total) by mouth daily. 30 tablet 11   enalapril (VASOTEC) 20 MG tablet Take 1 tablet (20 mg total) by mouth 2 (two) times daily. 60 tablet 11   furosemide (LASIX) 40 MG tablet Take 1 tablet (40 mg total) by mouth daily as needed. 30 tablet 11   spironolactone (ALDACTONE) 25 MG tablet Take 1 tablet (25 mg total) by mouth daily. 30 tablet 11   No current facility-administered medications for this visit.    Allergies:   Patient has no known allergies.    Social History:  The patient  reports that he has never smoked. He has never used smokeless tobacco. He reports that he does not drink alcohol and does not use drugs.   Family  History:  The patient's family history includes Hypertension in his mother.    ROS:  Please see the history of present illness.   Otherwise, review of systems are positive for none.   All other systems are reviewed and negative.    PHYSICAL EXAM: VS:  BP 120/68    Pulse 81    Ht 5\' 7"  (1.702 m)    Wt 218 lb (98.9 kg)    SpO2 98%    BMI 34.14 kg/m  , BMI Body mass index is 34.14 kg/m. GENERAL:  Well appearing HEENT:  Pupils equal round and reactive, fundi not visualized, oral mucosa unremarkable NECK:  No jugular venous distention, waveform within normal limits, carotid upstroke brisk and symmetric, no bruits, no thyromegaly LYMPHATICS:  No cervical, inguinal adenopathy LUNGS:  Clear to auscultation bilaterally BACK:  No CVA tenderness CHEST:  Unremarkable HEART:  PMI not displaced or sustained,S1 and S2 within normal limits, no S3, no S4, no clicks, no rubs, no murmurs ABD:  Flat, positive bowel sounds normal in frequency in pitch, no bruits, no rebound, no guarding, no midline pulsatile mass, no hepatomegaly, no splenomegaly EXT:  2 plus pulses throughout, no edema, no cyanosis no clubbing SKIN:  No rashes no nodules NEURO:  Cranial nerves II through XII grossly intact, motor grossly intact throughout PSYCH:  Cognitively intact, oriented to person place and time    EKG:  EKG is not ordered today. The ekg ordered 04/25/2021 demonstrates sinus rhythm, rate 73, axis within normal limits, intervals within normal limits, interpolated premature ventricular contractions, no acute ST-T wave changes.   Recent Labs: 04/25/2021: BUN 17; Creatinine, Ser 0.93; Potassium 4.6; Sodium 137    Lipid Panel    Component Value Date/Time   CHOL 197 04/25/2021 1024   TRIG 245 (H) 04/25/2021 1024   HDL 37 (L) 04/25/2021 1024   CHOLHDL 5.3 (H) 04/25/2021 1024   CHOLHDL 6.2 03/25/2019 0425   VLDL 63 (H) 03/25/2019 0425   LDLCALC 117 (H) 04/25/2021 1024      Wt Readings from Last 3 Encounters:   05/04/21 218 lb (98.9 kg)  05/01/21 220 lb 8 oz (100 kg)  04/25/21 221 lb 6.4 oz (100.4 kg)      Other studies Reviewed: Additional studies/ records that were reviewed today include: Primary care office and previous cardiac records Review of the above records demonstrates:  Please see elsewhere in the note.     ASSESSMENT AND PLAN:   Chronic systolic CHF:   His last ejection fraction was preserved but given his new complaints and his ectopy I will repeat an echocardiogram.  I will check a BNP level and a TSH.    HTN: His blood pressure is actually well  controlled.  No change in therapy.  PVCs: Given this and his dizziness I will check a 2-week Zio patch.  We will see him back after this.     Current medicines are reviewed at length with the patient today.  The patient does not have concerns regarding medicines.  The following changes have been made:  no change  Labs/ tests ordered today include:   Orders Placed This Encounter  Procedures   Brain natriuretic peptide   TSH   LONG TERM MONITOR (3-14 DAYS)   EKG 12-Lead   ECHOCARDIOGRAM COMPLETE     Disposition:   FU with me after the above procedure.      Signed, Rollene Rotunda, MD  05/04/2021 12:03 PM    [facility]

## 2021-05-04 ENCOUNTER — Other Ambulatory Visit: Payer: Self-pay | Admitting: Cardiology

## 2021-05-04 ENCOUNTER — Ambulatory Visit (INDEPENDENT_AMBULATORY_CARE_PROVIDER_SITE_OTHER): Payer: Managed Care, Other (non HMO)

## 2021-05-04 ENCOUNTER — Ambulatory Visit (INDEPENDENT_AMBULATORY_CARE_PROVIDER_SITE_OTHER): Payer: Managed Care, Other (non HMO) | Admitting: Cardiology

## 2021-05-04 ENCOUNTER — Encounter: Payer: Self-pay | Admitting: Cardiology

## 2021-05-04 ENCOUNTER — Other Ambulatory Visit: Payer: Self-pay

## 2021-05-04 ENCOUNTER — Other Ambulatory Visit (HOSPITAL_COMMUNITY): Payer: Self-pay

## 2021-05-04 VITALS — BP 120/68 | HR 81 | Ht 67.0 in | Wt 218.0 lb

## 2021-05-04 DIAGNOSIS — I10 Essential (primary) hypertension: Secondary | ICD-10-CM

## 2021-05-04 DIAGNOSIS — I5022 Chronic systolic (congestive) heart failure: Secondary | ICD-10-CM

## 2021-05-04 DIAGNOSIS — I493 Ventricular premature depolarization: Secondary | ICD-10-CM

## 2021-05-04 DIAGNOSIS — I428 Other cardiomyopathies: Secondary | ICD-10-CM

## 2021-05-04 DIAGNOSIS — R001 Bradycardia, unspecified: Secondary | ICD-10-CM

## 2021-05-04 NOTE — Progress Notes (Unsigned)
Enrolled for [employer] to mail a ZIO XT long term holter monitor to the patients address on file.  Spanish instructions requested.

## 2021-05-04 NOTE — Patient Instructions (Signed)
Medication Instructions:  The current medical regimen is effective;  continue present plan and medications.  *If you need a refill on your cardiac medications before your next appointment, please call your pharmacy*   Lab Work: BNP, TSH   If you have labs (blood work) drawn today and your tests are completely normal, you will receive your results only by: [place] (if you have MyChart) OR A paper copy in the mail If you have any lab test that is abnormal or we need to change your treatment, we will call you to review the results.   Testing/Procedures:  Echocardiogram - Your physician has requested that you have an echocardiogram. Echocardiography is a painless test that uses sound waves to create images of your heart. It provides your doctor with information about the size and shape of your heart and how well your hearts chambers and valves are working. This procedure takes approximately one hour. There are no restrictions for this procedure. This will be performed at either our [facility] location - [address] [place] location [facility] 2nd floor.  ZIO XT- Long Term Monitor Instructions  Your physician has requested you wear a ZIO patch monitor for 14 days.  This is a single patch monitor. [employer] supplies one patch monitor per enrollment. Additional stickers are not available. Please do not apply patch if you will be having a Nuclear Stress Test,  Echocardiogram, Cardiac CT, MRI, or Chest Xray during the period you would be wearing the  monitor. The patch cannot be worn during these tests. You cannot remove and re-apply the  ZIO XT patch monitor.  Your ZIO patch monitor will be mailed 3 day USPS to your address on file. It may take 3-5 days  to receive your monitor after you have been enrolled.  Once you have received your monitor, please review the enclosed instructions. Your monitor  has already been registered assigning a specific  monitor serial # to you.  Billing and Patient Assistance Program Information  We have supplied [employer] with any of your insurance information on file for billing purposes. [employer] offers a sliding scale Patient Assistance Program for patients that do not have  insurance, or whose insurance does not completely cover the cost of the ZIO monitor.  You must apply for the Patient Assistance Program to qualify for this discounted rate.  To apply, please call [employer] at [phone], select option 4, select option 2, ask to apply for  Patient Assistance Program. Theodore Demark will ask your household income, and how many people  are in your household. They will quote your out-of-pocket cost based on that information.  [employer] will also be able to set up a 84-month, interest-free payment plan if needed.  Applying the monitor   Shave hair from upper left chest.  Hold abrader disc by orange tab. Rub abrader in 40 strokes over the upper left chest as  indicated in your monitor instructions.  Clean area with 4 enclosed alcohol pads. Let dry.  Apply patch as indicated in monitor instructions. Patch will be placed under collarbone on left  side of chest with arrow pointing upward.  Rub patch adhesive wings for 2 minutes. Remove white label marked "1". Remove the white  label marked "2". Rub patch adhesive wings for 2 additional minutes.  While looking in a mirror, press and release button in center of patch. A small green light will  flash 3-4 times. This will be your only indicator that the  monitor has been turned on.  Do not shower for the first 24 hours. You may shower after the first 24 hours.  Press the button if you feel a symptom. You will hear a small click. Record Date, Time and  Symptom in the Patient Logbook.  When you are ready to remove the patch, follow instructions on the last 2 pages of Patient  Logbook. Stick patch monitor onto the last page of Patient Logbook.  Place Patient Logbook in the  blue and white box. Use locking tab on box and tape box closed  securely. The blue and white box has prepaid postage on it. Please place it in the mailbox as  soon as possible. Your physician should have your test results approximately 7 days after the  monitor has been mailed back to [facility].  Call [facility] at [phone] if you have questions regarding  your ZIO XT patch monitor. Call them immediately if you see an orange light blinking on your  monitor.  If your monitor falls off in less than 4 days, contact our Monitor department at [phone].  If your monitor becomes loose or falls off after 4 days call [employer] at [phone] for  suggestions on securing your monitor   Follow-Up: At [facility], you and your health needs are our priority.  As part of our continuing mission to provide you with exceptional heart care, we have created designated Provider Care Teams.  These Care Teams include your primary Cardiologist (physician) and Advanced Practice Providers (APPs -  Physician Assistants and Nurse Practitioners) who all work together to provide you with the care you need, when you need it.  We recommend signing up for the patient portal called "MyChart".  Sign up information is provided on this After Visit Summary.  MyChart is used to connect with patients for Virtual Visits (Telemedicine).  Patients are able to view lab/test results, encounter notes, upcoming appointments, etc.  Non-urgent messages can be sent to your provider as well.   To learn more about what you can do with MyChart, go to [email or web address].    Your next appointment:   1 month(s)  The format for your next appointment:   In Person  Provider:   Minus Breeding, MD

## 2021-05-05 LAB — TSH: TSH: 3.98 u[IU]/mL (ref 0.450–4.500)

## 2021-05-05 LAB — BRAIN NATRIURETIC PEPTIDE: BNP: 39.6 pg/mL (ref 0.0–100.0)

## 2021-05-11 ENCOUNTER — Ambulatory Visit (INDEPENDENT_AMBULATORY_CARE_PROVIDER_SITE_OTHER): Payer: Managed Care, Other (non HMO)

## 2021-05-11 ENCOUNTER — Other Ambulatory Visit: Payer: Self-pay

## 2021-05-11 ENCOUNTER — Encounter (HOSPITAL_BASED_OUTPATIENT_CLINIC_OR_DEPARTMENT_OTHER): Payer: Self-pay | Admitting: *Deleted

## 2021-05-11 DIAGNOSIS — R001 Bradycardia, unspecified: Secondary | ICD-10-CM

## 2021-05-11 DIAGNOSIS — I5022 Chronic systolic (congestive) heart failure: Secondary | ICD-10-CM | POA: Diagnosis not present

## 2021-05-11 DIAGNOSIS — I493 Ventricular premature depolarization: Secondary | ICD-10-CM

## 2021-05-11 DIAGNOSIS — I428 Other cardiomyopathies: Secondary | ICD-10-CM

## 2021-05-11 LAB — ECHOCARDIOGRAM COMPLETE
AR max vel: 1.66 cm2
AV Area VTI: 1.67 cm2
AV Area mean vel: 1.59 cm2
AV Mean grad: 6 mmHg
AV Peak grad: 11 mmHg
Ao pk vel: 1.66 m/s
Area-P 1/2: 4.71 cm2
S' Lateral: 4.72 cm

## 2021-05-11 MED ORDER — PERFLUTREN LIPID MICROSPHERE
1.0000 mL | INTRAVENOUS | Status: AC | PRN
  Administered 2021-05-11: 2 mL via INTRAVENOUS

## 2021-05-31 DIAGNOSIS — I493 Ventricular premature depolarization: Secondary | ICD-10-CM | POA: Insufficient documentation

## 2021-05-31 NOTE — Progress Notes (Signed)
?  ?Cardiology Office Note ? ? ?Date:  06/01/2021  ? ?ID:  Phillip [name], DOB Mar 06, 1966, MRN [id number] ? ?PCP:  Reymundo Poll, MD  ?Cardiologist:   None ?Referring:  Reymundo Poll, MD ? ?Chief Complaint  ?Patient presents with  ? Shortness of Breath  ? ? ?  ?History of Present Illness: ?Phillip [name] is a 56 y.o. male who presents for follow up of cardiomyopathy.   He has been hypertensive since age 57.  In 2014, he apparently had chest pain and had a stress PET at [facility].  Cardiac cath at that time did not show obstructive disease.  He had an echo here in 06/2013 that showed normal EF, 55-60%.  In 2/16, he was admitted twice with chest pain.  Both times, symptoms were thought to be atypical for ischemia. During the first admission, there was one troponin of 0.19 while others were normal.  During the second admission, there was one troponin to 0.05 while the others were normal.  Echo was done in 2/16, showing EF down to 30-35% with inferior and inferoseptal wall motion abnormality.  He underwent coronary angiography on 05/26/14 with normal coronaries, LVEF 35-40%  and LVEDP 26. Lasix was increased.  ?  ?After cMRi was ordered to look for possible Chagas CM. But he could not complete due to severe claustrophobia even with valium. Echo in 2021 demonstrated NL EF .  There were no valvular abnormalities.   ? ?This is his second visit with me.  EF wsa 60 - 65%.  There was very mild AS.    BNP and TSH were normal.  The conversation today was through the interpreter patient still seems to have some limited understanding.  He describes his shortness of breath does mention previously when he is climbing up stairs in particular.  He is not describing PND or orthopnea.  He is not describing chest pressure, neck or arm discomfort.  He has gained about 20 pounds over the last few years.  Of note, monitor is still outstanding and I do not have these results. ? ?Of note the exam was done through an  interpreter ? ? ?Past Medical History:  ?Diagnosis Date  ? Chronic back pain   ? "neck down" (07/20/2013)  ? Chronic systolic heart failure (HCC) 06/15/2014  ? GERD (gastroesophageal reflux disease)   ? High cholesterol   ? Hypertension   ? Renal calculus   ? ? ?Past Surgical History:  ?Procedure Laterality Date  ? CARDIAC CATHETERIZATION  2014?  ? CATARACT EXTRACTION W/ INTRAOCULAR LENS IMPLANT Right 02/16/2014  ? CORNEAL TRANSPLANT Right ~ 2010  ? EYE SURGERY Right   ? LEFT HEART CATHETERIZATION WITH CORONARY ANGIOGRAM N/A 05/26/2014  ? Procedure: LEFT HEART CATHETERIZATION WITH CORONARY ANGIOGRAM;  Surgeon: Laurey Morale, MD;  Location: [facility] CATH LAB;  Service: Cardiovascular;  Laterality: N/A;  ? RADIOLOGY WITH ANESTHESIA N/A 03/27/2019  ? Procedure: MRI WITH ANESTHESIA;  Surgeon: Radiologist, Medication, MD;  Location: [facility] OR;  Service: Radiology;  Laterality: N/A;  ? ? ? ?Current Outpatient Medications  ?Medication Sig Dispense Refill  ? citalopram (CELEXA) 10 MG tablet Take 1 tablet (10 mg total) by mouth daily. 30 tablet 11  ? enalapril (VASOTEC) 20 MG tablet Take 1 tablet (20 mg total) by mouth 2 (two) times daily. 60 tablet 11  ? furosemide (LASIX) 40 MG tablet Take 1 tablet (40 mg total) by mouth daily as needed. 30 tablet 11  ? spironolactone (ALDACTONE) 25 MG tablet Take  1 tablet (25 mg total) by mouth daily. 30 tablet 11  ? ?No current facility-administered medications for this visit.  ? ? ?Allergies:   Patient has no known allergies.  ? ? ?ROS:  Please see the history of present illness.   Otherwise, review of systems are positive for none.   All other systems are reviewed and negative.  ? ? ?PHYSICAL EXAM: ?VS:  BP 115/60   Pulse 76   Ht 5\' 7"  (1.702 m)   Wt 221 lb 6.4 oz (100.4 kg)   BMI 34.68 kg/m?  , BMI Body mass index is 34.68 kg/m?. ?GENERAL:  Well appearing ?NECK:  No jugular venous distention, waveform within normal limits, carotid upstroke brisk and symmetric, no bruits, no thyromegaly ?LUNGS:   Clear to auscultation bilaterally ?CHEST:  Unremarkable ?HEART:  PMI not displaced or sustained,S1 and S2 within normal limits, no S3, no S4, no clicks, no rubs, no murmurs ?ABD:  Flat, positive bowel sounds normal in frequency in pitch, no bruits, no rebound, no guarding, no midline pulsatile mass, no hepatomegaly, no splenomegaly ?EXT:  2 plus pulses throughout, no edema, no cyanosis no clubbing ? ?EKG:  EKG is  ordered today. ? ? ? ?Recent Labs: ?04/25/2021: BUN 17; Creatinine, Ser 0.93; Potassium 4.6; Sodium 137 ?05/04/2021: BNP 39.6; TSH 3.980  ? ? ?Lipid Panel ?   ?Component Value Date/Time  ? CHOL 197 04/25/2021 1024  ? TRIG 245 (H) 04/25/2021 1024  ? HDL 37 (L) 04/25/2021 1024  ? CHOLHDL 5.3 (H) 04/25/2021 1024  ? CHOLHDL 6.2 03/25/2019 0425  ? VLDL 63 (H) 03/25/2019 0425  ? LDLCALC 117 (H) 04/25/2021 1024  ? ?  ? ?Wt Readings from Last 3 Encounters:  ?06/01/21 221 lb 6.4 oz (100.4 kg)  ?05/04/21 218 lb (98.9 kg)  ?05/01/21 220 lb 8 oz (100 kg)  ?  ? ? ?Other studies Reviewed: ?Additional studies/ records that were reviewed today include: Echo ?Review of the above records demonstrates:  Please see elsewhere in the note.   ? ? ?ASSESSMENT AND PLAN: ? ? ?Chronic systolic CHF:    EF was normal.  I do not think heart failure is the etiology of his dyspnea.  It is probably related to his weight but I am going to check pulmonary function testing.  If this is normal then no further testing. ?  ?HTN: His blood pressure is controlled and he will continue the meds as listed.\ ? ?PVCs:  A monitor was ordered.  I do not yet have these results. ? ? ? ?Current medicines are reviewed at length with the patient today.  The patient does not have concerns regarding medicines. ? ?The following changes have been made:   None ? ?Labs/ tests ordered today include:   PFTs ? ? ? ?Disposition:   FU with me as needed based on the results of the above ? ? ?Signed, ?06/29/21, MD  ?06/01/2021 12:52 PM    ?[facility] ? ? ? ?

## 2021-06-01 ENCOUNTER — Ambulatory Visit (INDEPENDENT_AMBULATORY_CARE_PROVIDER_SITE_OTHER): Payer: Managed Care, Other (non HMO) | Admitting: Cardiology

## 2021-06-01 ENCOUNTER — Encounter: Payer: Self-pay | Admitting: *Deleted

## 2021-06-01 ENCOUNTER — Encounter: Payer: Self-pay | Admitting: Cardiology

## 2021-06-01 ENCOUNTER — Other Ambulatory Visit: Payer: Self-pay

## 2021-06-01 VITALS — BP 115/60 | HR 76 | Ht 67.0 in | Wt 221.4 lb

## 2021-06-01 DIAGNOSIS — I10 Essential (primary) hypertension: Secondary | ICD-10-CM

## 2021-06-01 DIAGNOSIS — I493 Ventricular premature depolarization: Secondary | ICD-10-CM

## 2021-06-01 DIAGNOSIS — I5022 Chronic systolic (congestive) heart failure: Secondary | ICD-10-CM

## 2021-06-01 NOTE — Patient Instructions (Signed)
Instrucciones para medicamentos:  ?Su m?dico recomienda que contin?e con sus medicamentos actuales seg?n las indicaciones. Consulte la lista actual de medicamentos que se le dio hoy.  ? ?Trabajo de laboratorio: ?Phillip [name] ? ?Pruebas/Procedimientos: ?Su m?dico le ha recomendado que se haga una prueba de funci?n pulmonar. Las pruebas de funci?n pulmonar son un grupo de pruebas que miden qu? tan bien entra y Administrator, sports aire de los pulmones. ? ?Seguimiento: ?SEG?N SEA NECESARIO  ? ?Cualquier otra instrucci?n especial se enumerar? a continuaci?n (si corresponde). ? ? Pruebas de la funci?n pulmonar ?Pulmonary Function Tests ?Las pruebas de la funci?n pulmonar son pruebas de respiraci?n que se utilizan para Teacher, adult education cu?n bien funcionan los pulmones, cu?l es la causa de los problemas pulmonares y cu?l es el mejor tratamiento para su caso. Probablemente le realicen pruebas de la funci?n pulmonar en los siguientes casos: ?Cuando tiene una enfermedad que afecta los pulmones. ?Para averiguar si hay cambios en la funci?n pulmonar con el paso del tiempo si tiene una enfermedad pulmonar a largo plazo (cr?nica). ?Si trabaja en una planta industrial. Las pruebas de la funci?n pulmonar examinan los efectos de estar expuesto a productos qu?micos durante un per?odo prolongado. ?Para detectar enfermedades que afectan a los pulmones, como el asma o la enfermedad pulmonar obstructiva cr?nica (EPOC). ?Para examinar la funci?n pulmonar antes de someterse a una cirug?a u otro procedimiento. ?Para examinar los pulmones si fuma. ?Para examinar si los medicamentos recetados o los tratamientos [employer]. ?Los resultados se comparar?n con los de la funci?n pulmonar esperada de alguien que tenga pulmones saludables y que sea similar a usted en varios aspectos. Entre ellos, se incluyen la edad, el sexo, la [place], el peso y la raza o el origen ?tnico. Esto se realiza para observar c?mo est? su funci?n pulmonar en comparaci?n con la funci?n  pulmonar normal (prevista en porcentaje). El valor previsto en porcentaje ayuda al m?dico a saber si su funci?n pulmonar es normal o no. Si ya se ha realizado pruebas de la funci?n pulmonar anteriormente, el m?dico comparar? los resultados actuales con los pasados. As? se controlar? si la funci?n pulmonar ha mejorado, empeorado, o contin?a igual. ?Informe al m?dico acerca de lo siguiente: ?Cualquier alergia que tenga. ?Todos los [employer], incluidos medicamentos para Tax inspector y Psychologist, educational, vitaminas, hierbas, gotas oft?lmicas, cremas y medicamentos de venta libre. ?Cualquier trastorno de [employer]. ?Cualquier cirug?a que le hayan realizado; en especial, cirug?as recientes en los ojos, el abdomen o el pecho. Estas cirug?as pueden dificultar o hacer insegura la realizaci?n de las pruebas de la funci?n pulmonar. ?Cualquier afecci?n m?dica que tenga, incluidos dolor en el pecho o problemas card?acos, tuberculosis o infecciones respiratorias, como neumon?a, resfr?o o gripe. ?Si tiene temor de IT trainer cerrados (claustrofobia). Algunas de las pruebas podr?an realizarse en espacios cerrados. ??Cu?les son los riesgos? ?En general, se trata de un procedimiento seguro. Sin embargo, pueden ocurrir complicaciones, por ejemplo: ?Sentirse aturdido debido a la respiraci?n r?pida y profunda conocida como respiraci?n forzada o hiperventilaci?n. ?Una ataque de asma debido a la respiraci?n profunda. ??Qu? ocurre antes del procedimiento? ?Use los medicamentos de venta libre y los recetados solamente como se lo haya indicado el m?dico. Si utiliza medicamentos, como inhaladores o nebulizadores, preg?ntele al m?dico qu? medicamentos debe tomar el d?a de la realizaci?n de las pruebas. Es posible que algunos medicamentos que se administran a trav?s de inhaladores interfieran con las pruebas de la funci?n pulmonar si se reciben muy poco antes de la realizaci?n de estas. ?  Siga las instrucciones del m?dico  respecto de las restricciones en las comidas o las bebidas. Entre ellas, se puede incluir evitar las comidas abundantes y no consumir cafe?na antes de las [place]. No consuma alcohol durante las 4 horas anteriores a las pruebas. ?No fume durante las 4 horas anteriores al procedimiento. Esto incluye los cigarrillos electr?nicos. ?Use ropa c?moda que no interfiera con la respiraci?n. ?Evite hacer ejercicio que requiera mucho esfuerzo (ejercicio extenuante) durante al menos 30 minutos antes de las pruebas. ??Qu? ocurre durante el procedimiento? ? ?Le dar?n un broche suave para que se coloque en la nariz. Esto se hace para que toda la respiraci?n sea por la boca en lugar de por la nariz. ?Le entregar?n una boquilla libre de microbios (esterilizada). Estar? sujeta a un equipo, llamado espir?metro, que mide la respiraci?n. ?Le pedir?n que haga diversos ejercicios de respiraci?n. Los ejercicios se realizar?n inspirando (inhalando) y espirando (exhalando). Quiz? le pidan que repita los ejercicios varias veces antes de que finalicen las pruebas. ?Es importante seguir las instrucciones con exactitud para Materials engineer. Recuerde soplar tan fuerte y r?pido como pueda cuando le indiquen [place]. ?Tal vez le den un medicamento llamado broncodilatador, que agranda las peque?as v?as respiratorias de los pulmones. Este USAA facilitar? la respiraci?n. Las pruebas se repetir?n despu?s de que el medicamento surta [place]. ?Durante el procedimiento, lo controlaran exhaustivamente para observar si presenta alg?n problema, como desmayos, mareos o dificultad para respirar. ?El procedimiento puede variar seg?n el m?dico y el hospital. ??Qu? puedo esperar despu?s del procedimiento de prueba? ?Es su responsabilidad retirar [employer] del procedimiento. Preg?ntele a su m?dico, o a un miembro del personal del departamento donde se realice el procedimiento, cu?ndo estar?n [employer]. Luego de haber recibido  [employer], hable con el m?dico para [employer] opciones de [place], si es necesario. ?Siga estas instrucciones en su casa: ?No consuma ning?n producto que contenga nicotina o tabaco, como cigarrillos, cigarrillos electr?nicos y tabaco de Higher education careers adviser. Si necesita ayuda para dejar de fumar, consulte al m?dico. ?Resumen ?Las pruebas de la funci?n pulmonar se utilizan para Teacher, adult education cu?n bien funcionan los pulmones, cu?l es la causa de los problemas pulmonares y cu?l es el mejor tratamiento para su caso. ?Si ya se ha realizado pruebas de la funci?n pulmonar anteriormente, el m?dico comparar? los resultados actuales con los pasados. As? se controlar? si la funci?n pulmonar ha mejorado, empeorado, o contin?a igual. ?Use ropa c?moda que no interfiera con la respiraci?n cuando realice las pruebas. ?Es su responsabilidad retirar [employer] del procedimiento. Luego de haberlos recibido, hable con el m?dico para [employer] opciones de [place], si es necesario. ?Esta informaci?n no tiene Marine scientist el consejo del m?dico. Aseg?rese de hacerle al m?dico cualquier pregunta que tenga. ?Document Revised: 04/19/2019 Document Reviewed: 04/19/2019 ?Elsevier Patient Education ? [place]. ? ?

## 2021-06-05 ENCOUNTER — Other Ambulatory Visit (HOSPITAL_COMMUNITY): Payer: Self-pay

## 2021-06-12 ENCOUNTER — Encounter: Payer: Self-pay | Admitting: Cardiology

## 2021-06-13 NOTE — Telephone Encounter (Addendum)
Patient is asking for someone to tell him if the monitor results are good or bad. ?

## 2021-06-21 NOTE — Telephone Encounter (Signed)
Note to patient is he has abnormal rhythm from the bottom of his heart and dr hochrein wants him to see another doctor [address]. [address] office will call him to schedule. ?

## 2021-07-09 ENCOUNTER — Other Ambulatory Visit (HOSPITAL_COMMUNITY): Payer: Self-pay

## 2021-07-13 ENCOUNTER — Other Ambulatory Visit: Payer: Self-pay | Admitting: Family Medicine

## 2021-07-13 ENCOUNTER — Ambulatory Visit: Payer: Self-pay

## 2021-07-13 DIAGNOSIS — S20212A Contusion of left front wall of thorax, initial encounter: Secondary | ICD-10-CM

## 2021-07-25 ENCOUNTER — Encounter: Payer: Managed Care, Other (non HMO) | Admitting: Internal Medicine

## 2021-07-26 ENCOUNTER — Encounter: Payer: Self-pay | Admitting: Internal Medicine

## 2021-08-09 ENCOUNTER — Other Ambulatory Visit (HOSPITAL_COMMUNITY): Payer: Self-pay

## 2021-09-10 ENCOUNTER — Other Ambulatory Visit (HOSPITAL_COMMUNITY): Payer: Self-pay

## 2021-11-14 ENCOUNTER — Telehealth: Payer: Self-pay | Admitting: *Deleted

## 2021-11-14 ENCOUNTER — Other Ambulatory Visit (HOSPITAL_COMMUNITY): Payer: Self-pay

## 2021-11-14 ENCOUNTER — Ambulatory Visit: Payer: Commercial Managed Care - HMO | Admitting: Internal Medicine

## 2021-11-14 ENCOUNTER — Encounter: Payer: Self-pay | Admitting: Internal Medicine

## 2021-11-14 VITALS — BP 117/71 | HR 81 | Temp 98.0°F | Ht 68.0 in | Wt 208.0 lb

## 2021-11-14 DIAGNOSIS — I5022 Chronic systolic (congestive) heart failure: Secondary | ICD-10-CM | POA: Diagnosis not present

## 2021-11-14 DIAGNOSIS — I110 Hypertensive heart disease with heart failure: Secondary | ICD-10-CM

## 2021-11-14 DIAGNOSIS — M79671 Pain in right foot: Secondary | ICD-10-CM

## 2021-11-14 DIAGNOSIS — I10 Essential (primary) hypertension: Secondary | ICD-10-CM

## 2021-11-14 MED ORDER — DICLOFENAC SODIUM 75 MG PO TBEC
75.0000 mg | DELAYED_RELEASE_TABLET | Freq: Two times a day (BID) | ORAL | 0 refills | Status: DC
  Filled 2021-11-14: qty 14, 7d supply, fill #0

## 2021-11-14 MED ORDER — ENALAPRIL MALEATE 20 MG PO TABS
20.0000 mg | ORAL_TABLET | Freq: Two times a day (BID) | ORAL | 2 refills | Status: DC
  Filled 2021-11-14: qty 90, 45d supply, fill #0

## 2021-11-14 MED ORDER — ENALAPRIL MALEATE 20 MG PO TABS
20.0000 mg | ORAL_TABLET | Freq: Two times a day (BID) | ORAL | 2 refills | Status: DC
  Filled 2021-11-14 – 2021-11-30 (×3): qty 180, 90d supply, fill #0
  Filled 2021-11-30: qty 60, 30d supply, fill #0

## 2021-11-14 MED ORDER — DICLOFENAC SODIUM 1 % EX GEL
4.0000 g | Freq: Four times a day (QID) | CUTANEOUS | 0 refills | Status: DC
  Filled 2021-11-14: qty 400, 25d supply, fill #0
  Filled 2021-11-14: qty 400, 30d supply, fill #0
  Filled 2021-11-14: qty 400, 25d supply, fill #0

## 2021-11-14 MED ORDER — FUROSEMIDE 40 MG PO TABS
40.0000 mg | ORAL_TABLET | Freq: Every day | ORAL | 2 refills | Status: DC | PRN
  Filled 2021-11-14 – 2021-11-29 (×2): qty 90, 90d supply, fill #0
  Filled 2021-11-30: qty 30, 30d supply, fill #0
  Filled 2021-11-30: qty 90, 90d supply, fill #0

## 2021-11-14 MED ORDER — SPIRONOLACTONE 25 MG PO TABS
25.0000 mg | ORAL_TABLET | Freq: Every day | ORAL | 2 refills | Status: DC
  Filled 2021-11-14: qty 90, 90d supply, fill #0
  Filled 2021-11-30: qty 30, 30d supply, fill #0
  Filled 2021-11-30: qty 90, 90d supply, fill #0

## 2021-11-14 NOTE — Telephone Encounter (Signed)
Walk in c/o pain in right foot.  Has been going on x 3 months.  Hurts to walk on.  Would like t get foot checked.

## 2021-11-14 NOTE — Telephone Encounter (Signed)
Ok, please have him schedule an appointment. Thanks

## 2021-11-14 NOTE — Patient Instructions (Signed)
Sr. [name],  Fue un placer verte hoy! Gracias por elegir [facility] para su atencin primaria.  Hoy hablamos de:  Dolor de pies: te he enviado un medicamento antiinflamatorio que puedes tomar [employer] al da durante una semana. Tambin te he enviado una crema antiinflamatoria que puedes frotar en la zona dolorida. Puede envolver el pie con la venda compresiva que le estoy dando para obtener ms apoyo, ya que esto puede ayudar a Paramedic an ms Chief Technology Officer.  Por favor programe una cita para que lo [place] cuando regrese de su viaje.  Mi mejor, Dr. August Saucer   [name],  It was nice seeing you today! Thank you for choosing [facility] for your Primary Care.    Today we talked about:   Foot pain: I have sent in an anti-inflammatory medicine that you can take two times daily for one week. I have also sent in a anti-inflammatory cream that you can rub on the painful area. You can wrap the foot in the compressive wrap I am giving you for more support as this can help relieve the pain further.  Please make an appointment to be seen when you return for your trip.  My best, Dr. August Saucer

## 2021-11-14 NOTE — Progress Notes (Unsigned)
CC: foot pain  HPI:  [name] with past medical history as detailed below who presents today for foot pain and medication refills. Please see problem based charting for detailed assessment and plan.  Past Medical History:  Diagnosis Date   Chronic back pain    "neck down" (07/20/2013)   Chronic systolic heart failure (HCC) 06/15/2014   GERD (gastroesophageal reflux disease)    High cholesterol    Hypertension    Renal calculus    Review of Systems:  Negative unless otherwise stated.  Physical Exam:  Vitals:   11/14/21 1324  BP: 117/71  Pulse: 81  Temp: 98 F (36.7 C)  TempSrc: Oral  SpO2: 98%  Weight: 208 lb (94.3 kg)  Height: 5\' 8"  (1.727 m)   Constitutional:Appears stated age. In no acute distress. Cardio:Regular rate and rhythm. No murmurs, rubs, or gallops. Pulm:Clear to auscultation bilaterally. Normal work of breathing on room air. for extremity edema. Focal ovalic area of mild edema with point tenderness over medial aspect of R foot just distal to medial malleolus. No increased warmth or erythema at this site.  Skin:Warm and dry. Neuro:Alert and oriented x3. No focal deficit noted. Psych:Pleasant mood and affect.   Assessment & Plan:   See Encounters Tab for problem based charting.  Foot pain, right Pain began around 2-3 months ago and to his knowledge was unprovoked. He describes that initially it felt like a "poke" sensation and that this remains the same sensation but now stronger. He sometimes is unable to walk due to the pain. He also sometimes experiences a pulsing sensation to the area. He denies cuts, wounds, abrasions to the foot. He typically wears socks and shoes outsides. The pain is worse when he first wakes up but at times it does prolong into the day and cause a limp.He has had pain like this before--at that time he was told he had bone spurs at that time and he was told to get shoe inserts and take  a certain medication for the pain.  He does still have the shoe inserts from the last experience with this pain but he mostly uses those in his work shoes as that is where he is on his feet most. They do help the pain. He has not tried any medication for the pain. Assessment: Patient has been noted to have L plantar calcaneal bone spur in the past however edema and point tenderness appreciated on exam today is not convincing for similar findings on the R being the source of his pain. Clinically he does not have a presentation convincing for plantar fasciitis or tarsal tunnel at this time. Based on the focal area of edema and tenderness I suspect he has bursitis causing his symptoms. Plan:No indication for imaging at this time. I will send in 1 week of diclofenac 75 mg BID for one week in addition to topical gel. I will also provide him with a compression wrap to use to see if this will help with his pain as well. Advised patient to follow-up with our clinic once he returns to [id number] from his upcoming trip.  Essential hypertension BP controlled today at 117/71. Patient is leaving for an extended trip and needs refills for spironolactone and enalapril. Plan:Refills sent.  Chronic systolic heart failure (HCC) Patient is leaving for an extended trip and needs refills for lasix. No evidence of volume overload on exam today. Plan:Refill sent.  Patient discussed with Dr. [place]

## 2021-11-15 DIAGNOSIS — M79671 Pain in right foot: Secondary | ICD-10-CM | POA: Insufficient documentation

## 2021-11-15 NOTE — Assessment & Plan Note (Addendum)
BP controlled today at 117/71. Patient is leaving for an extended trip and needs refills for spironolactone and enalapril. Plan:Refills sent.

## 2021-11-15 NOTE — Assessment & Plan Note (Signed)
Pain began around 2-3 months ago and to his knowledge was unprovoked. He describes that initially it felt like a "poke" sensation and that this remains the same sensation but now stronger. He sometimes is unable to walk due to the pain. He also sometimes experiences a pulsing sensation to the area. He denies cuts, wounds, abrasions to the foot. He typically wears socks and shoes outsides. The pain is worse when he first wakes up but at times it does prolong into the day and cause a limp.He has had pain like this before--at that time he was told he had bone spurs at that time and he was told to get shoe inserts and take a certain medication for the pain.  He does still have the shoe inserts from the last experience with this pain but he mostly uses those in his work shoes as that is where he is on his feet most. They do help the pain. He has not tried any medication for the pain. Assessment: Patient has been noted to have L plantar calcaneal bone spur in the past however edema and point tenderness appreciated on exam today is not convincing for similar findings on the R being the source of his pain. Clinically he does not have a presentation convincing for plantar fasciitis or tarsal tunnel at this time. Based on the focal area of edema and tenderness I suspect he has bursitis causing his symptoms. Plan:No indication for imaging at this time. I will send in 1 week of diclofenac 75 mg BID for one week in addition to topical gel. I will also provide him with a compression wrap to use to see if this will help with his pain as well. Advised patient to follow-up with our clinic once he returns to the [place] from his upcoming trip.

## 2021-11-15 NOTE — Assessment & Plan Note (Signed)
>>  ASSESSMENT AND PLAN FOR CHRONIC SYSTOLIC HEART FAILURE (HCC) WRITTEN ON 11/15/2021  9:13 AM BY ADDIE, Meerab Maselli, DO  Patient is leaving for an extended trip and needs refills for lasix . No evidence of volume overload on exam today. Plan:Refill sent.

## 2021-11-15 NOTE — Assessment & Plan Note (Signed)
Patient is leaving for an extended trip and needs refills for lasix. No evidence of volume overload on exam today. Plan:Refill sent.

## 2021-11-21 NOTE — Progress Notes (Signed)
Internal Medicine Clinic Attending  Case discussed with Dr. Gomez-Caraballo  at the time of the visit.  We reviewed the resident's history and exam and pertinent patient test results.  I agree with the assessment, diagnosis, and plan of care documented in the resident's note.  

## 2021-11-23 DIAGNOSIS — E119 Type 2 diabetes mellitus without complications: Secondary | ICD-10-CM

## 2021-11-23 HISTORY — DX: Type 2 diabetes mellitus without complications: E11.9

## 2021-11-28 ENCOUNTER — Ambulatory Visit (INDEPENDENT_AMBULATORY_CARE_PROVIDER_SITE_OTHER): Payer: Commercial Managed Care - HMO | Admitting: Internal Medicine

## 2021-11-28 ENCOUNTER — Encounter (HOSPITAL_COMMUNITY): Payer: Self-pay

## 2021-11-28 ENCOUNTER — Other Ambulatory Visit: Payer: Self-pay

## 2021-11-28 ENCOUNTER — Other Ambulatory Visit (HOSPITAL_COMMUNITY): Payer: Self-pay

## 2021-11-28 ENCOUNTER — Inpatient Hospital Stay (HOSPITAL_COMMUNITY)
Admission: AD | Admit: 2021-11-28 | Discharge: 2021-11-30 | DRG: 638 | Disposition: A | Discharge status: Home / Self Care | Payer: Commercial Managed Care - HMO | Attending: Internal Medicine | Admitting: Internal Medicine

## 2021-11-28 ENCOUNTER — Encounter (HOSPITAL_COMMUNITY): Payer: Self-pay | Admitting: Internal Medicine

## 2021-11-28 ENCOUNTER — Encounter: Payer: Self-pay | Admitting: Internal Medicine

## 2021-11-28 ENCOUNTER — Telehealth: Payer: Self-pay | Admitting: *Deleted

## 2021-11-28 VITALS — BP 134/80 | HR 68 | Temp 98.1°F | Ht 68.0 in | Wt 190.8 lb

## 2021-11-28 DIAGNOSIS — E785 Hyperlipidemia, unspecified: Secondary | ICD-10-CM

## 2021-11-28 DIAGNOSIS — I428 Other cardiomyopathies: Secondary | ICD-10-CM | POA: Diagnosis present

## 2021-11-28 DIAGNOSIS — K219 Gastro-esophageal reflux disease without esophagitis: Secondary | ICD-10-CM | POA: Diagnosis present

## 2021-11-28 DIAGNOSIS — E860 Dehydration: Secondary | ICD-10-CM | POA: Diagnosis present

## 2021-11-28 DIAGNOSIS — I5022 Chronic systolic (congestive) heart failure: Secondary | ICD-10-CM

## 2021-11-28 DIAGNOSIS — N179 Acute kidney failure, unspecified: Secondary | ICD-10-CM | POA: Diagnosis present

## 2021-11-28 DIAGNOSIS — Z603 Acculturation difficulty: Secondary | ICD-10-CM | POA: Diagnosis present

## 2021-11-28 DIAGNOSIS — G8929 Other chronic pain: Secondary | ICD-10-CM | POA: Diagnosis present

## 2021-11-28 DIAGNOSIS — I10 Essential (primary) hypertension: Secondary | ICD-10-CM

## 2021-11-28 DIAGNOSIS — E1165 Type 2 diabetes mellitus with hyperglycemia: Secondary | ICD-10-CM | POA: Diagnosis present

## 2021-11-28 DIAGNOSIS — R634 Abnormal weight loss: Secondary | ICD-10-CM | POA: Insufficient documentation

## 2021-11-28 DIAGNOSIS — I5032 Chronic diastolic (congestive) heart failure: Secondary | ICD-10-CM | POA: Diagnosis present

## 2021-11-28 DIAGNOSIS — E7800 Pure hypercholesterolemia, unspecified: Secondary | ICD-10-CM | POA: Diagnosis present

## 2021-11-28 DIAGNOSIS — Z794 Long term (current) use of insulin: Secondary | ICD-10-CM | POA: Diagnosis not present

## 2021-11-28 DIAGNOSIS — I110 Hypertensive heart disease with heart failure: Secondary | ICD-10-CM | POA: Diagnosis present

## 2021-11-28 DIAGNOSIS — Z79899 Other long term (current) drug therapy: Secondary | ICD-10-CM | POA: Diagnosis not present

## 2021-11-28 DIAGNOSIS — E119 Type 2 diabetes mellitus without complications: Secondary | ICD-10-CM

## 2021-11-28 LAB — CBC WITH DIFFERENTIAL/PLATELET
Abs Immature Granulocytes: 0.03 10*3/uL (ref 0.00–0.07)
Basophils Absolute: 0 10*3/uL (ref 0.0–0.1)
Basophils Relative: 1 %
Eosinophils Absolute: 0.1 10*3/uL (ref 0.0–0.5)
Eosinophils Relative: 1 %
HCT: 42.7 % (ref 39.0–52.0)
Hemoglobin: 14.9 g/dL (ref 13.0–17.0)
Immature Granulocytes: 1 %
Lymphocytes Relative: 32 %
Lymphs Abs: 1.6 10*3/uL (ref 0.7–4.0)
MCH: 29.6 pg (ref 26.0–34.0)
MCHC: 34.9 g/dL (ref 30.0–36.0)
MCV: 84.7 fL (ref 80.0–100.0)
Monocytes Absolute: 0.4 10*3/uL (ref 0.1–1.0)
Monocytes Relative: 8 %
Neutro Abs: 2.9 10*3/uL (ref 1.7–7.7)
Neutrophils Relative %: 57 %
Platelets: 255 10*3/uL (ref 150–400)
RBC: 5.04 MIL/uL (ref 4.22–5.81)
RDW: 13.2 % (ref 11.5–15.5)
WBC: 5 10*3/uL (ref 4.0–10.5)
nRBC: 0 % (ref 0.0–0.2)

## 2021-11-28 LAB — POCT URINALYSIS DIPSTICK
Glucose, UA: POSITIVE — AB
Ketones, UA: NEGATIVE
Leukocytes, UA: NEGATIVE
Negative: NEGATIVE
Nitrite, UA: NEGATIVE
Protein, UA: NEGATIVE
Spec Grav, UA: <=1.005 — AB (ref 1.010–1.025)
Urobilinogen, UA: 0.2 E.U./dL
pH, UA: 5 (ref 5.0–8.0)

## 2021-11-28 LAB — BASIC METABOLIC PANEL
Anion gap: 14 (ref 5–15)
Anion gap: 13 (ref 5–15)
BUN: 41 mg/dL — ABNORMAL HIGH (ref 6–20)
BUN: 42 mg/dL — ABNORMAL HIGH (ref 6–20)
CO2: 22 mmol/L (ref 22–32)
CO2: 24 mmol/L (ref 22–32)
Calcium: 9.4 mg/dL (ref 8.9–10.3)
Calcium: 9.4 mg/dL (ref 8.9–10.3)
Chloride: 88 mmol/L — ABNORMAL LOW (ref 98–111)
Chloride: 90 mmol/L — ABNORMAL LOW (ref 98–111)
Creatinine, Ser: 1.91 mg/dL — ABNORMAL HIGH (ref 0.61–1.24)
Creatinine, Ser: 1.67 mg/dL — ABNORMAL HIGH (ref 0.61–1.24)
GFR, Estimated: 40 mL/min — ABNORMAL LOW (ref >60)
GFR, Estimated: 47 mL/min — ABNORMAL LOW (ref >60)
Glucose, Bld: 877 mg/dL (ref 70–99)
Glucose, Bld: 619 mg/dL (ref 70–99)
Potassium: 4.9 mmol/L (ref 3.5–5.1)
Potassium: 4.4 mmol/L (ref 3.5–5.1)
Sodium: 124 mmol/L — ABNORMAL LOW (ref 135–145)
Sodium: 127 mmol/L — ABNORMAL LOW (ref 135–145)

## 2021-11-28 LAB — POTASSIUM: Potassium: 3.7 mmol/L (ref 3.5–5.1)

## 2021-11-28 LAB — GLUCOSE, CAPILLARY
Glucose-Capillary: >600 mg/dL (ref 70–99)
Glucose-Capillary: >600 mg/dL (ref 70–99)
Glucose-Capillary: >600 mg/dL (ref 70–99)
Glucose-Capillary: >600 mg/dL (ref 70–99)
Glucose-Capillary: 501 mg/dL (ref 70–99)
Glucose-Capillary: 457 mg/dL — ABNORMAL HIGH (ref 70–99)
Glucose-Capillary: 471 mg/dL — ABNORMAL HIGH (ref 70–99)
Glucose-Capillary: 433 mg/dL — ABNORMAL HIGH (ref 70–99)
Glucose-Capillary: 442 mg/dL — ABNORMAL HIGH (ref 70–99)
Glucose-Capillary: 448 mg/dL — ABNORMAL HIGH (ref 70–99)
Glucose-Capillary: 343 mg/dL — ABNORMAL HIGH (ref 70–99)
Glucose-Capillary: 408 mg/dL — ABNORMAL HIGH (ref 70–99)
Glucose-Capillary: 287 mg/dL — ABNORMAL HIGH (ref 70–99)
Glucose-Capillary: 312 mg/dL — ABNORMAL HIGH (ref 70–99)
Glucose-Capillary: 196 mg/dL — ABNORMAL HIGH (ref 70–99)

## 2021-11-28 LAB — POCT GLYCOSYLATED HEMOGLOBIN (HGB A1C): HbA1c POC (<> result, manual entry): >14 % (ref 4.0–5.6)

## 2021-11-28 LAB — OSMOLALITY: Osmolality: 312 mOsm/kg — ABNORMAL HIGH (ref 275–295)

## 2021-11-28 LAB — BETA-HYDROXYBUTYRIC ACID: Beta-Hydroxybutyric Acid: 1.07 mmol/L — ABNORMAL HIGH (ref 0.05–0.27)

## 2021-11-28 MED ORDER — LACTATED RINGERS IV BOLUS
1000.0000 mL | Freq: Once | INTRAVENOUS | Status: AC
  Administered 2021-11-28: 1000 mL via INTRAVENOUS

## 2021-11-28 MED ORDER — LACTATED RINGERS IV SOLN
INTRAVENOUS | Status: AC
Start: 2021-11-29 — End: 2021-11-29

## 2021-11-28 MED ORDER — INSULIN GLARGINE 100 UNIT/ML ~~LOC~~ SOLN
20.0000 [IU] | Freq: Once | SUBCUTANEOUS | Status: AC
  Administered 2021-11-28: 20 [IU] via SUBCUTANEOUS

## 2021-11-28 MED ORDER — DEXTROSE 50 % IV SOLN: 0.0000 mL | INTRAVENOUS | Status: DC | PRN

## 2021-11-28 MED ORDER — ONDANSETRON HCL 4 MG PO TABS: 4.0000 mg | ORAL_TABLET | Freq: Four times a day (QID) | ORAL | Status: DC | PRN

## 2021-11-28 MED ORDER — ACETAMINOPHEN 650 MG RE SUPP: 650.0000 mg | Freq: Four times a day (QID) | RECTAL | Status: DC | PRN

## 2021-11-28 MED ORDER — INSULIN ASPART 100 UNIT/ML IJ SOLN
15.0000 [IU] | Freq: Once | INTRAMUSCULAR | Status: AC
  Administered 2021-11-28: 15 [IU] via SUBCUTANEOUS

## 2021-11-28 MED ORDER — POTASSIUM CHLORIDE CRYS ER 20 MEQ PO TBCR
40.0000 meq | EXTENDED_RELEASE_TABLET | Freq: Once | ORAL | Status: AC
  Administered 2021-11-28: 40 meq via ORAL
  Filled 2021-11-28: qty 2

## 2021-11-28 MED ORDER — ONDANSETRON HCL 4 MG/2ML IJ SOLN
4.0000 mg | Freq: Four times a day (QID) | INTRAMUSCULAR | Status: DC | PRN
  Administered 2021-11-28: 4 mg via INTRAVENOUS
  Filled 2021-11-28: qty 2

## 2021-11-28 MED ORDER — ACETAMINOPHEN 325 MG PO TABS: 650.0000 mg | ORAL_TABLET | Freq: Four times a day (QID) | ORAL | Status: DC | PRN

## 2021-11-28 MED ORDER — INSULIN ASPART 100 UNIT/ML IJ SOLN
20.0000 [IU] | Freq: Once | INTRAMUSCULAR | Status: AC
  Administered 2021-11-28: 20 [IU] via SUBCUTANEOUS

## 2021-11-28 MED ORDER — ENOXAPARIN SODIUM 40 MG/0.4ML IJ SOSY
40.0000 mg | PREFILLED_SYRINGE | INTRAMUSCULAR | Status: DC
  Administered 2021-11-28 – 2021-11-29 (×2): 40 mg via SUBCUTANEOUS
  Filled 2021-11-28 (×2): qty 0.4

## 2021-11-28 MED ORDER — LACTATED RINGERS IV SOLN: INTRAVENOUS | Status: DC

## 2021-11-28 MED ORDER — INSULIN REGULAR(HUMAN) IN NACL 100-0.9 UT/100ML-% IV SOLN
INTRAVENOUS | Status: DC
  Administered 2021-11-28: 19 [IU]/h via INTRAVENOUS
  Administered 2021-11-28: 13 [IU]/h via INTRAVENOUS
  Filled 2021-11-28 (×2): qty 100

## 2021-11-28 MED ORDER — ENOXAPARIN SODIUM 40 MG/0.4ML IJ SOSY: 40.0000 mg | PREFILLED_SYRINGE | INTRAMUSCULAR | Status: DC

## 2021-11-28 NOTE — Addendum Note (Signed)
Addended by: Bufford Spikes on: 11/28/2021 03:20 PM   Modules accepted: Orders

## 2021-11-28 NOTE — Addendum Note (Signed)
Addended by: Michelle Piper on: 11/28/2021 02:57 PM   Modules accepted: Orders

## 2021-11-28 NOTE — Telephone Encounter (Signed)
Received call from Phillip [name] with [facility] with Critical glucose of 619. Patient's previous glucose today was 877. Provider is aware and treating patient at present. Patient awaiting admission.

## 2021-11-28 NOTE — Addendum Note (Signed)
Addended by: Michelle Piper on: 11/28/2021 02:53 PM   Modules accepted: Orders

## 2021-11-28 NOTE — Progress Notes (Signed)
  Subjective:   Patient ID: Phillip [name] male   DOB: January 20, 1966 56 y.o.   MRN: [id number]  HPI: Phillip [name] is a 56 y.o. male with past medical history outlined below here for evaluation of polyuria, polydipsia, and syncope. For the details of today's visit, please refer to the assessment and plan.  Patient has been feeling very poorly since his last visit. He reports unintentional weight loss and poor appetite. On chart review he is down 30 lbs since February, and 18 lbs since his last visit two weeks ago.  He reports dry mouth, polydipsia, and polyuria. He had a syncopal episode at work yesterday. 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 He denies N/V and abdominal pain.   He has a history of diet controlled diabetes, last hemoglobin A1c eight months ago was 6.5. Repeat Hgb A1c today is >14, CBG is too high to be calculated. I am checking STAT BMP, beta-hydroxybutyric acid, and POC urinalysis. Unfortunately he is scheduled to leave the country next Wednesday. He has a three week trip planned to [place], his home country.   Past Medical History:  Diagnosis Date   Chronic back pain    "neck down" (07/20/2013)   Chronic systolic heart failure (HCC) 06/15/2014   GERD (gastroesophageal reflux disease)    High cholesterol    Hypertension    Renal calculus    Current Outpatient Medications  Medication Sig Dispense Refill   diclofenac (VOLTAREN) 75 MG EC tablet Take 1 tablet (75 mg total) by mouth 2 (two) times daily. 14 tablet 0   diclofenac Sodium (VOLTAREN ARTHRITIS PAIN) 1 % GEL Apply 4 g topically 4 (four) times daily. 400 g 0   enalapril (VASOTEC) 20 MG tablet Take 1 tablet (20 mg total) by mouth 2 (two) times daily. 180 tablet 2   furosemide (LASIX) 40 MG tablet Take 1 tablet (40 mg total) by mouth daily as needed. 90 tablet 2   spironolactone (ALDACTONE) 25 MG tablet Take 1 tablet (25 mg total) by mouth daily.  90 tablet 2   No current facility-administered medications for this visit.   Family History  Problem Relation Age of Onset   Hypertension Mother       Objective:  Physical Exam:  Vitals:   11/28/21 0851  BP: 121/75  Pulse: 88  Temp: 98.1 F (36.7 C)  TempSrc: Oral  SpO2: 98%  Weight: 190 lb 12.8 oz (86.5 kg)  Height: 5\' 8"  (1.727 m)    Constitutional: NAD Cardiovascular: RRR, no m/r/g Pulmonary/Chest: clear bilaterally, normal effort Extremities: warm no edema    Assessment & Plan:   Type 2 diabetes mellitus (HCC) Previously diet controlled - now with severe symptomatic hyperglycemia, 30 lb unintentional weight loss, and a syncopal episode yesterday at work. Hgb A1c is > 14. He is leaving the country next week so I think it's urgent that we address this quickly. I discussed with patient it would be safest to admit him to the hospital for treatment, titration of his insulin, and to allow to come up with a safe medication regimen for him prior to leaving the country. He has never been on insulin before and will require a lot of education. POC urinalysis shows glucosuria but no ketones. BMP and beta hydroxybutyric acid are pending. Will call and discuss with the admitting resident.

## 2021-11-28 NOTE — Addendum Note (Signed)
Addended by: Burnell Blanks on: 11/28/2021 10:42 AM   Modules accepted: Orders

## 2021-11-28 NOTE — Addendum Note (Signed)
Addended by: Shye Doty L on: 11/28/2021 02:13 PM   Modules accepted: Orders  

## 2021-11-28 NOTE — Assessment & Plan Note (Signed)
Previously diet controlled - now with severe symptomatic hyperglycemia, 30 lb unintentional weight loss, and a syncopal episode yesterday at work. Hgb A1c is > 14. He is leaving the country next week so I think it's urgent that we address this quickly. I discussed with patient it would be safest to admit him to the hospital for treatment, titration of his insulin, and to allow [place] to come up with a safe medication regimen for him prior to leaving the country. He has never been on insulin before and will require a lot of education. POC urinalysis shows glucosuria but no ketones. BMP and beta hydroxybutyric acid are pending. Will call and discuss with the admitting resident.

## 2021-11-28 NOTE — Hospital Course (Addendum)
[name] is a 56yo M with a PMh of GERD, HFimEF, HLD, HTN, and T2DM.   Patient presented to clinic earlier this AM for evaluation of polyuria, polydipsia, and syncope.   The  patient has been feeling very poorly since his last visit clinic visit a couple of weeks ago. He reports unintentional weight loss and poor appetite. On chart review he is down 30 lbs since February, and 18 lbs since his last visit two weeks ago.  He reports dry mouth, polydipsia, and polyuria. He also notes a syncopal episode at work the day prior to admission. 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 He denies N/V and abdominal pain, fever or chills.    He has a history of diet controlled diabetes, last hemoglobin A1c eight months ago was 6.5. Repeat Hgb A1c in clinic was >14, CBG was too high to be calculated. Patient was not acidotic, but had mildly elevated sure ketones, also noted to have glucosuria with no ketonuria. He was admitted to the hospital for symptomatic hyperglycemia and rapid titration of his insulin regimen as he is leaving the country for a multi week trip next Wednesday. [1]   [1] Clinic note 11/28/2021. Dr. Reymundo Poll   9/7- complains of blurry vision.  No abd pain. He was not aware that he had diabetes. He was not controlling with diet. Discussed necessity of controlling DM as outpatient as well.  9/8 - feeling better. Having some blurry vision, improved slightly.[name] Updated patient on care plan. Informed him he will be discharged with insulin, needles, glucometer. Informed him to follow up with ophtho and pcp. OTC eye drops for dry eye.

## 2021-11-28 NOTE — Progress Notes (Signed)
Report called to [facility] on 3E. Pt accompanied by RN; belongings taken.  RM# [id number].

## 2021-11-28 NOTE — Addendum Note (Signed)
Addended by: Michelle Piper on: 11/28/2021 04:41 PM   Modules accepted: Orders

## 2021-11-28 NOTE — H&P (Signed)
Date: 11/28/2021               Patient Name:  Phillip [name] MRN: [id number]  DOB: 1966-03-22 Age / Sex: 56 y.o., male   PCP: Reymundo Poll, MD         Medical Service: Internal Medicine Teaching Service         Attending Physician: Dr. Inez Catalina, MD    First Contact: Dr. Karoline Caldwell, MD  Pager: [phone]  Second Contact: Dr. Quincy Simmonds, MD  Pager: [phone]       After Hours (After 5p/  First Contact Pager: [phone]  weekends / holidays): Second Contact Pager: [phone]   Chief Complaint: symptomatic severe hyperglycemia   History of Present Illness:   Phillip [name] is a 56yo M with a PMh of GERD, HFimEF, HLD, HTN, and T2DM.   Patient presented to clinic earlier this AM for evaluation of polyuria, polydipsia, and syncope.   The  patient has been feeling very poorly since his last visit clinic visit a couple of weeks ago. He reports unintentional weight loss and poor appetite. On chart review he is down 30 lbs since February, and 18 lbs since his last visit two weeks ago.  He reports dry mouth, polydipsia, and polyuria. He also notes a syncopal episode at work the day prior to admission. 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 He denies N/V and abdominal pain, fever or chills.    He has a history of diet controlled diabetes, last hemoglobin A1c eight months ago was 6.5. Repeat Hgb A1c in clinic was >14, CBG was too high to be calculated. Patient was not acidotic, but had mildly elevated sure ketones, also noted to have glucosuria with no ketonuria. He was admitted to the hospital for symptomatic hyperglycemia and rapid titration of his insulin regimen as he is leaving the country for a [place] week trip next Wednesday. [1]   [1] Clinic note 11/28/2021. Dr. Reymundo Poll   Meds:  Current Facility-Administered Medications on File Prior to Encounter  Medication Dose Route Frequency Provider Last Rate  Last Admin   lactated ringers infusion   Intravenous Continuous Marolyn Haller, MD       Current Outpatient Medications on File Prior to Encounter  Medication Sig Dispense Refill   diclofenac (VOLTAREN) 75 MG EC tablet Take 1 tablet (75 mg total) by mouth 2 (two) times daily. 14 tablet 0   diclofenac Sodium (VOLTAREN ARTHRITIS PAIN) 1 % GEL Apply 4 g topically 4 (four) times daily. 400 g 0   enalapril (VASOTEC) 20 MG tablet Take 1 tablet (20 mg total) by mouth 2 (two) times daily. 180 tablet 2   furosemide (LASIX) 40 MG tablet Take 1 tablet (40 mg total) by mouth daily as needed. 90 tablet 2   spironolactone (ALDACTONE) 25 MG tablet Take 1 tablet (25 mg total) by mouth daily. 90 tablet 2       Allergies: Allergies as of 11/28/2021   (No Known Allergies)   Past Medical History:  Diagnosis Date   Chronic back pain    "neck down" (07/20/2013)   Chronic systolic heart failure (HCC) 06/15/2014   GERD (gastroesophageal reflux disease)    High cholesterol    Hypertension    Renal calculus     Family History:  Family History  Problem Relation Age of Onset   Hypertension Mother      Social History:  Social History   Tobacco Use   Smoking status: Never   Smokeless tobacco: Never  Vaping Use   Vaping Use: Never used  Substance Use Topics   Alcohol use: No    Alcohol/week: 0.0 standard drinks of alcohol   Drug use: No    Socially drinks, rarely.  No other tobacco use or drug use.  Lives with his son.    Review of Systems: A complete ROS was negative except as per HPI.   Physical Exam: There were no vitals taken for this visit.  Constitutional: Well-developed, well-nourished, and in no distress.  HENT:  Head: Normocephalic and atraumatic.  Eyes: EOM are normal.  Neck: Normal range of motion.  Mouth: Dry mucus membranes  Cardiovascular: Normal rate, regular rhythm, intact distal pulses. No gallop and no friction rub.  No murmur heard. No lower extremity edema   Pulmonary: Non labored breathing on room air, no wheezing or rales  Abdominal: Soft. Normal bowel sounds. Non distended and non tender Musculoskeletal: Normal range of motion.        General: No tenderness or edema.  Neurological: Alert and oriented to person, place, and time. Non focal  Skin: Skin is warm and dry.    EKG: personally reviewed my interpretation is pending at the time of this note.    Assessment & Plan by Problem: Principal Problem:   Severe hyperglycemia due to diabetes mellitus [facility]) Phillip [name] is a 56yo M with a PMh of GERD, HFimEF, HLD, HTN, and T2DM who presents with polydipsia, polyuria, and syncopal episode found to have blood glucose elevated to the 800s with mild ketonemia, no acidosis and no ketonuria.   #Severe symptomatic hyperglycemia  Patient previously had diet controlled T2DM. His last A1c was 6.5 in 02/2021. He states he has been symptomatic for the past several weeks. He has also lost several pounds over the last several months. He lost 27 since 05/2021 and about 14lbs since 11/14/2021. Given his presenting symptomatology it is likely that patient is now in a insulin insufficient state, perhaps due to pancreatic burnout. Irrespective of the etiology, he will likely require insulin moving forward. He received long acting and short acting insulin while in clinic this AM.  -Start IV insulin with endotool  -IV fluids  -Consult diabetes educator   #AKI  Likely prerenal in the setting of osmotic diuresis due to hyperglycemia.  -IV fluids  -Hold home HF medications  #HFimEF  #NICM likely 2/2 HTN EF now 60 to 65% on 04/2021 TTE. Patient appears volume down on exam. Will hold home MRA, AceI, and lasix.  -IV fluids per above, resume home medications once euvolemic and AKI resolved    Dispo: Admit patient to Inpatient with expected length of stay greater than 2 midnights.  Signed: Marolyn Haller, MD 11/28/2021, 4:10 PM  After 5pm on weekdays  and 1pm on weekends: On Call pager: [phone]

## 2021-11-28 NOTE — Addendum Note (Signed)
Addended by: Michelle Piper on: 11/28/2021 02:23 PM   Modules accepted: Orders

## 2021-11-29 ENCOUNTER — Other Ambulatory Visit (HOSPITAL_COMMUNITY): Payer: Self-pay

## 2021-11-29 DIAGNOSIS — Z794 Long term (current) use of insulin: Secondary | ICD-10-CM | POA: Diagnosis not present

## 2021-11-29 DIAGNOSIS — N179 Acute kidney failure, unspecified: Secondary | ICD-10-CM

## 2021-11-29 DIAGNOSIS — E1165 Type 2 diabetes mellitus with hyperglycemia: Secondary | ICD-10-CM | POA: Diagnosis not present

## 2021-11-29 LAB — PHOSPHORUS: Phosphorus: 3.7 mg/dL (ref 2.5–4.6)

## 2021-11-29 LAB — BASIC METABOLIC PANEL
Anion gap: 11 (ref 5–15)
Anion gap: 9 (ref 5–15)
Anion gap: 7 (ref 5–15)
Anion gap: 10 (ref 5–15)
Anion gap: 8 (ref 5–15)
BUN: 41 mg/dL — ABNORMAL HIGH (ref 6–20)
BUN: 39 mg/dL — ABNORMAL HIGH (ref 6–20)
BUN: 39 mg/dL — ABNORMAL HIGH (ref 6–20)
BUN: 32 mg/dL — ABNORMAL HIGH (ref 6–20)
BUN: 33 mg/dL — ABNORMAL HIGH (ref 6–20)
CO2: 21 mmol/L — ABNORMAL LOW (ref 22–32)
CO2: 23 mmol/L (ref 22–32)
CO2: 22 mmol/L (ref 22–32)
CO2: 22 mmol/L (ref 22–32)
CO2: 22 mmol/L (ref 22–32)
Calcium: 8.8 mg/dL — ABNORMAL LOW (ref 8.9–10.3)
Calcium: 8.8 mg/dL — ABNORMAL LOW (ref 8.9–10.3)
Calcium: 8.3 mg/dL — ABNORMAL LOW (ref 8.9–10.3)
Calcium: 8.1 mg/dL — ABNORMAL LOW (ref 8.9–10.3)
Calcium: 8.4 mg/dL — ABNORMAL LOW (ref 8.9–10.3)
Chloride: 97 mmol/L — ABNORMAL LOW (ref 98–111)
Chloride: 98 mmol/L (ref 98–111)
Chloride: 101 mmol/L (ref 98–111)
Chloride: 103 mmol/L (ref 98–111)
Chloride: 102 mmol/L (ref 98–111)
Creatinine, Ser: 1.43 mg/dL — ABNORMAL HIGH (ref 0.61–1.24)
Creatinine, Ser: 1.35 mg/dL — ABNORMAL HIGH (ref 0.61–1.24)
Creatinine, Ser: 1.3 mg/dL — ABNORMAL HIGH (ref 0.61–1.24)
Creatinine, Ser: 1.16 mg/dL (ref 0.61–1.24)
Creatinine, Ser: 1.22 mg/dL (ref 0.61–1.24)
GFR, Estimated: 57 mL/min — ABNORMAL LOW (ref >60)
GFR, Estimated: >60 mL/min (ref >60)
GFR, Estimated: >60 mL/min (ref >60)
GFR, Estimated: >60 mL/min (ref >60)
GFR, Estimated: >60 mL/min (ref >60)
Glucose, Bld: 120 mg/dL — ABNORMAL HIGH (ref 70–99)
Glucose, Bld: 146 mg/dL — ABNORMAL HIGH (ref 70–99)
Glucose, Bld: 273 mg/dL — ABNORMAL HIGH (ref 70–99)
Glucose, Bld: 569 mg/dL (ref 70–99)
Glucose, Bld: 173 mg/dL — ABNORMAL HIGH (ref 70–99)
Potassium: 4 mmol/L (ref 3.5–5.1)
Potassium: 4.1 mmol/L (ref 3.5–5.1)
Potassium: 3.9 mmol/L (ref 3.5–5.1)
Potassium: 4.5 mmol/L (ref 3.5–5.1)
Potassium: 3.9 mmol/L (ref 3.5–5.1)
Sodium: 129 mmol/L — ABNORMAL LOW (ref 135–145)
Sodium: 130 mmol/L — ABNORMAL LOW (ref 135–145)
Sodium: 130 mmol/L — ABNORMAL LOW (ref 135–145)
Sodium: 135 mmol/L (ref 135–145)
Sodium: 132 mmol/L — ABNORMAL LOW (ref 135–145)

## 2021-11-29 LAB — CBC
HCT: 38.2 % — ABNORMAL LOW (ref 39.0–52.0)
Hemoglobin: 13.1 g/dL (ref 13.0–17.0)
MCH: 28.9 pg (ref 26.0–34.0)
MCHC: 34.3 g/dL (ref 30.0–36.0)
MCV: 84.3 fL (ref 80.0–100.0)
Platelets: 238 10*3/uL (ref 150–400)
RBC: 4.53 MIL/uL (ref 4.22–5.81)
RDW: 13.1 % (ref 11.5–15.5)
WBC: 6.7 10*3/uL (ref 4.0–10.5)
nRBC: 0 % (ref 0.0–0.2)

## 2021-11-29 LAB — GLUCOSE, CAPILLARY
Glucose-Capillary: 120 mg/dL — ABNORMAL HIGH (ref 70–99)
Glucose-Capillary: 121 mg/dL — ABNORMAL HIGH (ref 70–99)
Glucose-Capillary: 128 mg/dL — ABNORMAL HIGH (ref 70–99)
Glucose-Capillary: 128 mg/dL — ABNORMAL HIGH (ref 70–99)
Glucose-Capillary: 137 mg/dL — ABNORMAL HIGH (ref 70–99)
Glucose-Capillary: 138 mg/dL — ABNORMAL HIGH (ref 70–99)
Glucose-Capillary: 145 mg/dL — ABNORMAL HIGH (ref 70–99)
Glucose-Capillary: 147 mg/dL — ABNORMAL HIGH (ref 70–99)
Glucose-Capillary: 148 mg/dL — ABNORMAL HIGH (ref 70–99)
Glucose-Capillary: 150 mg/dL — ABNORMAL HIGH (ref 70–99)
Glucose-Capillary: 153 mg/dL — ABNORMAL HIGH (ref 70–99)
Glucose-Capillary: 157 mg/dL — ABNORMAL HIGH (ref 70–99)
Glucose-Capillary: 158 mg/dL — ABNORMAL HIGH (ref 70–99)
Glucose-Capillary: 181 mg/dL — ABNORMAL HIGH (ref 70–99)
Glucose-Capillary: 200 mg/dL — ABNORMAL HIGH (ref 70–99)
Glucose-Capillary: 202 mg/dL — ABNORMAL HIGH (ref 70–99)
Glucose-Capillary: 206 mg/dL — ABNORMAL HIGH (ref 70–99)
Glucose-Capillary: 240 mg/dL — ABNORMAL HIGH (ref 70–99)
Glucose-Capillary: 279 mg/dL — ABNORMAL HIGH (ref 70–99)
Glucose-Capillary: 284 mg/dL — ABNORMAL HIGH (ref 70–99)
Glucose-Capillary: 297 mg/dL — ABNORMAL HIGH (ref 70–99)

## 2021-11-29 LAB — MAGNESIUM: Magnesium: 2.3 mg/dL (ref 1.7–2.4)

## 2021-11-29 MED ORDER — INSULIN ASPART 100 UNIT/ML IJ SOLN
0.0000 [IU] | Freq: Three times a day (TID) | INTRAMUSCULAR | Status: DC
  Administered 2021-11-30 (×2): 8 [IU] via SUBCUTANEOUS

## 2021-11-29 MED ORDER — LIVING WELL WITH DIABETES BOOK - IN SPANISH
Freq: Once | Status: AC
Start: 2021-11-29 — End: 2021-11-29
  Filled 2021-11-29: qty 1

## 2021-11-29 MED ORDER — INSULIN REGULAR(HUMAN) IN NACL 100-0.9 UT/100ML-% IV SOLN
INTRAVENOUS | Status: DC
  Administered 2021-11-29: 13 [IU]/h via INTRAVENOUS
  Administered 2021-11-29: 1.5 [IU]/h via INTRAVENOUS
  Filled 2021-11-29: qty 100

## 2021-11-29 MED ORDER — ENSURE ENLIVE PO LIQD
237.0000 mL | Freq: Two times a day (BID) | ORAL | Status: DC
  Administered 2021-11-29: 237 mL via ORAL

## 2021-11-29 MED ORDER — LACTATED RINGERS IV BOLUS
1000.0000 mL | Freq: Once | INTRAVENOUS | Status: AC
  Administered 2021-11-29: 1000 mL via INTRAVENOUS

## 2021-11-29 MED ORDER — DEXTROSE IN LACTATED RINGERS 5 % IV SOLN: INTRAVENOUS | Status: DC

## 2021-11-29 MED ORDER — LACTATED RINGERS IV SOLN: INTRAVENOUS | Status: DC

## 2021-11-29 MED ORDER — INSULIN STARTER KIT- PEN NEEDLES (SPANISH)
1.0000 | Freq: Once | Status: DC
  Filled 2021-11-29: qty 1

## 2021-11-29 MED ORDER — INSULIN GLARGINE-YFGN 100 UNIT/ML ~~LOC~~ SOLN
20.0000 [IU] | Freq: Every day | SUBCUTANEOUS | Status: DC
  Administered 2021-11-29 – 2021-11-30 (×2): 20 [IU] via SUBCUTANEOUS
  Filled 2021-11-29 (×2): qty 0.2

## 2021-11-29 NOTE — Inpatient Diabetes Management (Signed)
Inpatient Diabetes Program Recommendations  AACE/ADA: New Consensus Statement on Inpatient Glycemic Control (2015)  Target Ranges:  Prepandial:   less than 140 mg/dL      Peak postprandial:   less than 180 mg/dL (1-2 hours)      Critically ill patients:  140 - 180 mg/dL   Lab Results  Component Value Date   GLUCAP 200 (H) 11/29/2021   HGBA1C >14.0 11/28/2021    Review of Glycemic Control  Diabetes history: Pre Diabetes  Current orders for Inpatient glycemic control:  IV insulin  Spoke with pt and family at bedside. Pt prefers to use son for interpretation. Pt reports being diagnosed with prediabetes but was not placed on medication nor was educated on changes to be made with diet and exercise. Discussed current A1c of >14%. Introduced the need for pt to check glucose at least twice a day with a fasting glucose and a check later in the day. Also discussed the need for insulin at time of d/c. Showed pt and allowed demonstration of insulin pen administration steps. Discussed hypo and hyperglycemia s/s and treatment for both. Discussed lifestyle modifications with dietary and beverage options. Pt has a physically demanding occupation.   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 Glucose meter kit order # [id number] Insulin pen needles order #[id number]   Thanks,  Tama Headings RN, MSN, BC-ADM Inpatient Diabetes Coordinator Team Pager [phone] (8a-5p)

## 2021-11-29 NOTE — Plan of Care (Signed)
Endotool continues  Problem: Education: Goal: Knowledge of General Education information will improve Description: Including pain rating scale, medication(s)/side effects and non-pharmacologic comfort measures Outcome: Progressing   Problem: Health Behavior/Discharge Planning: Goal: Ability to manage health-related needs will improve Outcome: Progressing   Problem: Clinical Measurements: Goal: Ability to maintain clinical measurements within normal limits will improve Outcome: Progressing Goal: Will remain free from infection Outcome: Progressing Goal: Diagnostic test results will improve Outcome: Progressing Goal: Respiratory complications will improve Outcome: Progressing Goal: Cardiovascular complication will be avoided Outcome: Progressing   Problem: Activity: Goal: Risk for activity intolerance will decrease Outcome: Progressing   Problem: Nutrition: Goal: Adequate nutrition will be maintained Outcome: Progressing   Problem: Coping: Goal: Level of anxiety will decrease Outcome: Progressing   Problem: Elimination: Goal: Will not experience complications related to bowel motility Outcome: Progressing Goal: Will not experience complications related to urinary retention Outcome: Progressing   Problem: Pain Managment: Goal: General experience of comfort will improve Outcome: Progressing   Problem: Safety: Goal: Ability to remain free from injury will improve Outcome: Progressing   Problem: Skin Integrity: Goal: Risk for impaired skin integrity will decrease Outcome: Progressing

## 2021-11-29 NOTE — Progress Notes (Signed)
   Subjective:   Patient complaining of blurry vision this morning out of his left eye.  Patient also states that he has headache and dry mouth.  Patient denies nausea and vomiting.  Discussed that symptoms are likely secondary to his blood glucose and our plan to continue with insulin and IV fluids today.  Objective:  Vital signs in last 24 hours: Vitals:   11/28/21 2012 11/29/21 0452  BP: 102/64 104/73  Pulse: 88 67  Resp: 18 12  Temp: 98.6 F (37 C) 97.9 F (36.6 C)  TempSrc: Oral Oral  SpO2: 100% 94%  Weight: 88.1 kg   Height: 5\' 8"  (1.727 m)    Physical Exam: Constitutional: Well-developed, well-nourished, not in distress.   Mouth: Dry mucus membranes  Cardiovascular: Normal rate, regular rhythm, intact distal pulses. No murmur, rub, or gallop. No LE edema.  Pulmonary: Non labored breathing on room air, no wheezing, rales, or rhonchi Abdominal: Normal bowel sounds.  Musculoskeletal: Normal range of motion.     Neurological: Alert and oriented to person, place, and time. No focal deficits present.  Skin: warm and dry.   Assessment/Plan:  Principal Problem:   Severe hyperglycemia due to diabetes mellitus (HCC)  Phillip [name] is a 56 y/o male with past medical history of GERD, HFimEF, HLD, HTN, and T2DM that presented with polydipsia, polyuria, and syncopal episode found to have blood glucose elevated to the 800s with mild ketonemia, no acidosis and no ketonuria.    # Severe symptomatic hyperglycemia  Patient clarified that he has never been diagnosed with diabetes before. Has been symptomatic for several weeks. A1c 6.5 in 02/2021. Notes losing 27 pounds since 05/2021. A1c > 14 on admission w/ glucose > 600. BHBA 1.07. Started on endotool with IVF. Glucose in low 100s this morning. Diabetes educator consulted.  - Transition off IV insulin via endotool and onto semglee 20 (with meal) once glucose < 140 - Dextrose 50% solution IV - LR 125 ml/hr   2. # AKI  Baseline  creatinine 0.9-1.1. Likely prerenal due to osmotic diuresis.Creatinine 1.43 on admission. Creatinine improved to 1.3 today.  -IV fluids  -Holding home HF medications (Enalapril 20 mg, furosemide 40 mg, spironolactone 25 mg)   3. # HFimEF  # NICM likely 2/2 HTN EF 60-65% in 04/2021 TTE. Still appears volume depleted on exam. No lasix at this point.  - IV fluids per above - Resume home medications once euvolemic and AKI resolved   4. # Dispo Has 3 week trip to [place] planned and scheduled to leave next Wednesday.   Diet: NPO IVF: LR VTE: Lovenox Code: Full  Prior to Admission Living Arrangement: at home with son Anticipated Discharge Location: at home with son Barriers to Discharge: continued management Dispo: Anticipated discharge in approximately less than 2 day(s).   Wednesday, MD 11/29/2021, 6:12 AM Pager: [phone] After 5pm on weekdays and 1pm on weekends: On Call pager [phone]

## 2021-11-29 NOTE — Progress Notes (Signed)
   11/29/21 1430  Mobility  Activity Ambulated independently in hallway  Level of Assistance Independent  Assistive Device None  Distance Ambulated (ft) 550 ft  Activity Response Tolerated well  $Mobility charge 1 Mobility   Mobility Specialist Progress Note  Received pt in bathroom having no complaints and agreeable to mobility. Pt was asymptomatic throughout ambulation and returned to room w/o fault. Left EOB w/ call bell in reach and all needs met.   Phillip [name] Mobility Specialist

## 2021-11-29 NOTE — Plan of Care (Signed)
  Problem: Education: Goal: Knowledge of General Education information will improve Description: Including pain rating scale, medication(s)/side effects and non-pharmacologic comfort measures Outcome: Progressing   Problem: Clinical Measurements: Goal: Respiratory complications will improve Outcome: Progressing   Problem: Activity: Goal: Risk for activity intolerance will decrease Outcome: Progressing   Problem: Nutrition: Goal: Adequate nutrition will be maintained Outcome: Progressing   Problem: Coping: Goal: Level of anxiety will decrease Outcome: Progressing   Problem: Pain Managment: Goal: General experience of comfort will improve Outcome: Progressing   Problem: Education: Goal: Ability to describe self-care measures that may prevent or decrease complications (Diabetes Survival Skills Education) will improve Outcome: Progressing   Problem: Education: Goal: Ability to describe self-care measures that may prevent or decrease complications (Diabetes Survival Skills Education) will improve Outcome: Progressing

## 2021-11-29 NOTE — Plan of Care (Signed)
?  Problem: Education: ?Goal: Knowledge of General Education information will improve ?Description: Including pain rating scale, medication(s)/side effects and non-pharmacologic comfort measures ?Outcome: Progressing ?  ?Problem: Clinical Measurements: ?Goal: Respiratory complications will improve ?Outcome: Progressing ?  ?Problem: Activity: ?Goal: Risk for activity intolerance will decrease ?Outcome: Progressing ?  ?Problem: Nutrition: ?Goal: Adequate nutrition will be maintained ?Outcome: Progressing ?  ?Problem: Coping: ?Goal: Level of anxiety will decrease ?Outcome: Progressing ?  ?Problem: Pain Managment: ?Goal: General experience of comfort will improve ?Outcome: Progressing ?  ?

## 2021-11-30 ENCOUNTER — Other Ambulatory Visit (HOSPITAL_COMMUNITY): Payer: Self-pay

## 2021-11-30 DIAGNOSIS — E1165 Type 2 diabetes mellitus with hyperglycemia: Secondary | ICD-10-CM | POA: Diagnosis not present

## 2021-11-30 DIAGNOSIS — N179 Acute kidney failure, unspecified: Secondary | ICD-10-CM | POA: Diagnosis not present

## 2021-11-30 DIAGNOSIS — Z794 Long term (current) use of insulin: Secondary | ICD-10-CM | POA: Diagnosis not present

## 2021-11-30 LAB — GLUCOSE, CAPILLARY
Glucose-Capillary: 284 mg/dL — ABNORMAL HIGH (ref 70–99)
Glucose-Capillary: 268 mg/dL — ABNORMAL HIGH (ref 70–99)

## 2021-11-30 LAB — BASIC METABOLIC PANEL
Anion gap: 6 (ref 5–15)
BUN: 32 mg/dL — ABNORMAL HIGH (ref 6–20)
CO2: 24 mmol/L (ref 22–32)
Calcium: 8.4 mg/dL — ABNORMAL LOW (ref 8.9–10.3)
Chloride: 102 mmol/L (ref 98–111)
Creatinine, Ser: 1.31 mg/dL — ABNORMAL HIGH (ref 0.61–1.24)
GFR, Estimated: >60 mL/min (ref >60)
Glucose, Bld: 265 mg/dL — ABNORMAL HIGH (ref 70–99)
Potassium: 5.1 mmol/L (ref 3.5–5.1)
Sodium: 132 mmol/L — ABNORMAL LOW (ref 135–145)

## 2021-11-30 MED ORDER — GLUCOSE BLOOD VI STRP
ORAL_STRIP | 0 refills | Status: DC
  Filled 2022-01-07: qty 100, 25d supply, fill #0

## 2021-11-30 MED ORDER — INSULIN GLARGINE-YFGN 100 UNIT/ML ~~LOC~~ SOLN
5.0000 [IU] | Freq: Once | SUBCUTANEOUS | Status: AC
  Administered 2021-11-30: 5 [IU] via SUBCUTANEOUS
  Filled 2021-11-30: qty 0.05

## 2021-11-30 MED ORDER — BLOOD GLUCOSE MONITOR KIT
PACK | 0 refills | Status: DC
  Filled 2021-11-30: qty 1, 30d supply, fill #0

## 2021-11-30 MED ORDER — GLUCOSE BLOOD VI STRP
ORAL_STRIP | 0 refills | Status: DC
  Filled 2021-11-30: qty 100, 30d supply, fill #0

## 2021-11-30 MED ORDER — LANCETS 33G MISC
0 refills | Status: DC
  Filled 2021-11-30: qty 100, 30d supply, fill #0

## 2021-11-30 MED ORDER — LANCETS 33G MISC
0 refills | Status: DC
  Filled 2022-02-11: qty 100, 25d supply, fill #0

## 2021-11-30 MED ORDER — FUROSEMIDE 40 MG PO TABS
40.0000 mg | ORAL_TABLET | Freq: Every day | ORAL | 0 refills | Status: DC | PRN
  Filled 2021-11-30: qty 30, 30d supply, fill #0

## 2021-11-30 MED ORDER — BASAGLAR KWIKPEN 100 UNIT/ML ~~LOC~~ SOPN
25.0000 [IU] | PEN_INJECTOR | Freq: Every day | SUBCUTANEOUS | 0 refills | Status: DC
  Filled 2021-11-30: qty 9, 30d supply, fill #0

## 2021-11-30 MED ORDER — PEN NEEDLES 32G X 4 MM MISC
1.0000 | Freq: Once | 0 refills | Status: DC
  Filled 2021-11-30: qty 100, 30d supply, fill #0

## 2021-11-30 MED ORDER — SPIRONOLACTONE 25 MG PO TABS
25.0000 mg | ORAL_TABLET | Freq: Every day | ORAL | 0 refills | Status: DC
  Filled 2021-11-30: qty 30, 30d supply, fill #0

## 2021-11-30 MED ORDER — ENALAPRIL MALEATE 20 MG PO TABS
20.0000 mg | ORAL_TABLET | Freq: Two times a day (BID) | ORAL | 0 refills | Status: DC
  Filled 2021-11-30: qty 60, 30d supply, fill #0

## 2021-11-30 NOTE — Progress Notes (Signed)
   11/30/21 1038  Mobility  Activity Ambulated independently in hallway  Level of Assistance Independent  Assistive Device None  Distance Ambulated (ft) 550 ft  Activity Response Tolerated well  $Mobility charge 1 Mobility   Mobility Specialist Progress Note  Received pt in bed having no complaints and agreeable to mobility. Pt was asymptomatic throughout ambulation and returned to room w/o fault. Left in bed w/ call bell in reach and all needs met.   Lucious Groves Mobility Specialist

## 2021-11-30 NOTE — Discharge Summary (Signed)
Name: Phillip [name] MRN: [id number] DOB: 1965-06-17 56 y.o. PCP: Velna Ochs, MD  Date of Admission: 11/28/2021  4:45 PM Date of Discharge: 11/30/2021 Attending Physician: Sid Falcon, MD  Discharge Diagnosis: 1. Principal Problem:   Severe hyperglycemia due to diabetes mellitus ([place]) AKI  Discharge Medications: Allergies as of 11/30/2021   No Known Allergies      Medication List     TAKE these medications    Basaglar KwikPen 100 UNIT/ML Inject 25 Units into the skin daily.   blood glucose meter kit and supplies Kit Use up to four times daily as directed.   diclofenac 75 MG EC tablet Commonly known as: VOLTAREN Take 1 tablet (75 mg total) by mouth 2 (two) times daily.   diclofenac Sodium 1 % Gel Commonly known as: Voltaren Arthritis Pain Apply 4 g topically 4 (four) times daily.   enalapril 20 MG tablet Commonly known as: VASOTEC Take 1 tablet (20 mg total) by mouth 2 (two) times daily.   furosemide 40 MG tablet Commonly known as: Lasix Take 1 tablet (40 mg total) by mouth daily as needed. What changed: reasons to take this   glucose blood test strip Use as directed up to four times daily   Lancets 33G Misc Use as directed up to four times daily   Pen Needles 32G X 4 MM Misc 1 kit by Other route once for 1 dose.   spironolactone 25 MG tablet Commonly known as: ALDACTONE Tome 1 tableta (25 mg en total) por va oral diariamente. (Take 1 tablet (25 mg total) by mouth daily.)        Disposition and follow-up:   Mr.Sneijder D Garay-Triminio was discharged from [facility] in Good condition.  At the hospital follow up visit please address:  1.    A. Severe Symptomatic Hyperglycemia due to DM  B. AKI  2.  Labs / imaging needed at time of follow-up: CBC, BMP  3.  Pending labs/ test needing follow-up: none  Follow-up Appointments:  Follow-up Information     Velna Ochs, MD. Go on 12/31/2021.   Specialty: Internal  Medicine Why: @3 :15pm Contact information: [place] [address] [address] Hospital Course by problem list: Phillip [name] is a 56 y/o male with past medical history of GERD, HFimEF, HLD, HTN, and T2DM that presented with polydipsia, polyuria, and syncopal episode found to have blood glucose elevated to the 800s with mild ketonemia, no acidosis and no ketonuria.    # Severe symptomatic hyperglycemia  Only had previous diagnosis of prediabetes but not type 2 diabetes. A1c 6.5 in 02/2021. Presented with polyuria, polydipsia, and syncope. Reported losing 27 pounds since 05/2021. A1c > 14 on admission w/ glucose > 600. BHBA 1.07. Transitioned off endotool and IVF on 9/7 and onto Semglee 25 units and SSI set to [place]. Morning glucose in the 200s on day of discharge. Diabetes educator was consulted and discussed need for insulin and glucose monitoring at home. Provided with prescription for basaglar kwikpen 25 Units into the skin daily, pen needles, lancets, test strips, and glucometer via [place] to bedside. Instructions on using these items were provided in both [place] and spanish. Interpreter used for translation throughout his hospital stay.   2. # AKI  Baseline Cr 0.9-1.1. Creatinine improved after IVF. Likely prerenal. Creatinine stable at 1.31 on day of discharge. Held home HF medications (  Enalapril 20 mg, furosemide 40 mg, spironolactone 25 mg) due to AKI. Restarted HF medications on discharge.    3. # HFimEF  # NICM likely 2/2 HTN Echo in 04/2021 demonstrated EF 60-65%. Appeared volume down on exam and therefore did not give lasix. Resumed home medications (Enalapril 20 mg, furosemide 40 mg, spironolactone 25 mg) on discharge.    4. # Dispo Has 3 week trip to [place] planned and scheduled to leave on 12/05/2021. Provided with work note as well as note for airlines stating that he needs to have his basaglar [employer] 25 Units, pen needles,  lancets, test strips, and glucometer at ALL times when traveling to [place] and back to the [place]. Instructed patient to carefully monitor his blood sugar throughout his trip and from now on. Discussed the importance of using his insulin as directed. Discussed importance of following up in clinic to further tailor his diabetic regimen.   Discharge Exam:   BP 125/67 (BP Location: Right Arm)   Pulse 83   Temp 98.5 F (36.9 C) (Oral)   Resp 20   Ht 5' 8"  (1.727 m)   Wt 89 kg   SpO2 98%   BMI 29.83 kg/m   Constitutional: Well-developed, well-nourished, not in distress.   Cardiovascular: Normal rate, regular rhythm, intact distal pulses. No murmur, rub, or gallop. No LE edema.  Pulmonary: Non labored breathing on room air, no wheezing, rales, or rhonchi Abdominal: Normal bowel sounds.  Musculoskeletal: Normal range of motion.     Neurological: Alert and oriented to person, place, and time. No focal deficits present.  Skin: warm and dry.   Pertinent Labs, Studies, and Procedures:     Latest Ref Rng & Units 11/29/2021    2:57 AM 11/28/2021    9:12 AM 01/10/2020    8:37 AM  CBC  WBC 4.0 - 10.5 K/uL 6.7  5.0  7.8   Hemoglobin 13.0 - 17.0 g/dL 13.1  14.9  14.7   Hematocrit 39.0 - 52.0 % 38.2  42.7  44.9   Platelets 150 - 400 K/uL 238  255  338        Latest Ref Rng & Units 11/30/2021   12:47 AM 11/29/2021    6:28 PM 11/29/2021    4:37 PM  BMP  Glucose 70 - 99 mg/dL 265  173  569   BUN 6 - 20 mg/dL 32  33  32   Creatinine 0.61 - 1.24 mg/dL 1.31  1.22  1.16   Sodium 135 - 145 mmol/L 132  132  135   Potassium 3.5 - 5.1 mmol/L 5.1  3.9  4.5   Chloride 98 - 111 mmol/L 102  102  103   CO2 22 - 32 mmol/L 24  22  22    Calcium 8.9 - 10.3 mg/dL 8.4  8.4  8.1      Discharge Instructions: Discharge Instructions     Call MD for:  difficulty breathing, headache or visual disturbances   Complete by: As directed    Call MD for:  extreme fatigue   Complete by: As directed    Call MD for:   persistant dizziness or light-headedness   Complete by: As directed    Call MD for:  persistant nausea and vomiting   Complete by: As directed    Diet - low sodium heart healthy   Complete by: As directed    Increase activity slowly   Complete by: As directed       You  were hospitalized for high blood sugar. Your high blood sugar is due to diabetes.   Hospital course: We treated your high blood sugar with insulin.  Your kidney function was also mildly low when you got here but improved with intravenous fluids. We are sending you home on insulin (glargine) for your diabetes. You will inject glargine daily with the pen needles. You will check your blood glucose in the morning, before and after each meal, and at night with the blood glucose meter. A disposable blood lancet is used to pierce the skin to obtain a drop of blood sample that is then placed on a disposable test strip which is inserted into your glucometer to calculate the glucose level in your blood.    Medications:   Please start taking: -Insulin glargine (Basaglar KwikPen) 100 unit/ML (inject 25 units into the skin in the morning each day) for your diabetes -Insulin starter kit pen needles (contains needles that you will use to inject glargine) -Blood glucose meter and supplies kit (contains supplies and glucose meter) to measure your blood glucose   Please continue taking: -Diclofenac (Voltaren) 75 mg tablet (by mouth twice daily) for pain -Diclofenac sodium (Voltaren arthritis pain) 1% gel (apply 4 g to the skin 4 times daily) for pain -Enalapril (Vasotec) 20 mg tablet (by mouth twice daily) for high blood pressure -Furosemide (Lasix) 40 mg tablet (once by mouth daily as needed) for swelling -Spironolactone (Aldactone) 25 mg tablet (once by mouth daily) for high blood pressure   Follow-Up: - Please follow up with your primary care provider Dr. Velna Ochs at the [facility] on 12/31/2021. You  can be referred to an ophthalmologist by your primary care provider.    --------------------------------------------------------------------------------------  Lo hospitalizaron por niveles altos de azcar en sangre. Su nivel alto de Dispensing optician se debe a la diabetes.   Curso hospitalario: Air cabin crew de azcar en sangre con insulina. Su funcin renal tambin estaba levemente baja cuando lleg aqu, pero mejor con lquidos intravenosos. Lo enviaremos a casa con insulina (glargina) para su diabetes. Se inyectar glargina diariamente con las agujas de la pluma. Controlar su nivel de glucosa en sangre por la maana, antes y despus de cada comida y por la noche con Estate agent. Se utiliza una lanceta de sangre desechable para perforar la piel y Therapist, music una gota de [place] de sangre que luego se coloca en una tira reactiva desechable que se inserta en el glucmetro para calcular el nivel de glucosa en la sangre.   Medicamentos:   Por favor comience a tomar: -Nurse, adult Best boy) 100 unidades/ML (inyecte 25 unidades en la piel por la [employer]) para su diabetes -Agujas para pluma del kit inicial de insulina (contiene agujas que usar para Horticulturist, commercial) -Medidor de glucosa en sangre y kit de suministros (contiene suministros y Teacher, music de Glass blower/designer) para medir su glucosa en sangre   Por favor contine tomando: -Diclofenaco (Voltaren) tableta de 75 mg (por va oral dos veces al da) para Conservation officer, historic buildings -Diclofenaco sdico (Voltaren dolor de artritis) gel al 1% (aplicar 4 g en la piel 4 veces al da) para Conservation officer, historic buildings -Enalapril (Vasotec) tableta de 20 mg (por va oral dos veces al da) para la presin arterial alta -Furosemida (Lasix) tableta de 40 mg (una vez por va oral al da segn sea necesario) para la hinchazn -Espironolactona (Aldactone) tableta de 25 mg (una vez por va oral al da) para la presin  arterial alta   Hacer un seguimiento: - Haga un  seguimiento con su proveedor de atencin primaria, la Dra. Velna Ochs, en la [place] [facility] 12/02/2021. Su proveedor de atencin primaria puede derivarlo a Theatre stage manager.  Signed: Starlyn Skeans, MD 11/30/2021, 1:35 PM   Pager: [phone]

## 2021-11-30 NOTE — TOC Transition Note (Signed)
Transition of Care [facility]) - CM/SW Discharge Note   Patient Details  Name: Phillip [name] MRN: [id number] Date of Birth: [birth year]  Transition of Care [facility]) CM/SW Contact:  Leone Haven, RN Phone Number: 11/30/2021, 12:33 PM   Clinical Narrative:    NCM spoke with patient using video interpreter, he states he is going to pay for a uber to transport him home today, he states he has enalapril , furosemide, and spironolactone at the [facility] outpatient pharmacy and wanted to know if the [facility] pharmacy could fill these for him also.  This NCM contacted the outpatient pharmacy to check on , they state yes they have the meds on hold and they will have the [facility] pharmacy to putll them so they could fill these with the other meds for today that MD has prescribed.  They will let this NCM know what the price is.          Patient Goals and CMS Choice        Discharge Placement                       Discharge Plan and Services                                     Social Determinants of Health (SDOH) Interventions     Readmission Risk Interventions     No data to display

## 2021-11-30 NOTE — Discharge Instructions (Addendum)
You were hospitalized for high blood sugar. Your high blood sugar is due to diabetes.  Hospital course: We treated your high blood sugar with insulin.  Your kidney function was also mildly low when you got here but improved with intravenous fluids. We are sending you home on insulin (glargine) for your diabetes. You will inject glargine daily with the pen needles. You will check your blood glucose in the morning, before and after each meal, and at night with the blood glucose meter. A disposable blood lancet is used to pierce the skin to obtain a drop of blood sample that is then placed on a disposable test strip which is inserted into your glucometer to calculate the glucose level in your blood.   Medications:  Please start taking: -Insulin glargine (Basaglar KwikPen) 100 unit/ML (inject 25 units into the skin in the morning each day) for your diabetes -Insulin starter kit pen needles (contains needles that you will use to inject glargine) -Blood glucose meter and supplies kit (contains supplies and glucose meter) to measure your blood glucose  Please continue taking: -Diclofenac (Voltaren) 75 mg tablet (by mouth twice daily) for pain -Diclofenac sodium (Voltaren arthritis pain) 1% gel (apply 4 g to the skin 4 times daily) for pain -Enalapril (Vasotec) 20 mg tablet (by mouth twice daily) for high blood pressure -Furosemide (Lasix) 40 mg tablet (once by mouth daily as needed) for swelling -Spironolactone (Aldactone) 25 mg tablet (once by mouth daily) for high blood pressure  Follow-Up: - Please follow up with your primary care provider Dr. Velna Ochs at the [facility] on 12/31/2021. You can be referred to an ophthalmologist by your primary care provider.    --------------------------------------------------------------------------------------------------------------------------------------------------------------------------- Lo hospitalizaron por niveles altos  de azcar en sangre. Su nivel alto de Dispensing optician se debe a la diabetes.  Curso hospitalario: Air cabin crew de azcar en sangre con insulina. Su funcin renal tambin estaba levemente baja cuando lleg aqu, pero mejor con lquidos intravenosos. Lo enviaremos a casa con insulina (glargina) para su diabetes. Se inyectar glargina diariamente con las agujas de la pluma. Controlar su nivel de glucosa en sangre por la maana, antes y despus de cada comida y por la noche con Estate agent. Se utiliza una lanceta de sangre desechable para perforar la piel y Therapist, music una gota de [place] de sangre que luego se coloca en una tira reactiva desechable que se inserta en el glucmetro para calcular el nivel de glucosa en la sangre.  Medicamentos:  Por favor comience a tomar: -Nurse, adult Best boy) 100 unidades/ML (inyecte 25 unidades en la piel por la [employer]) para su diabetes -Agujas para pluma del kit inicial de insulina (contiene agujas que usar para Horticulturist, commercial) -Medidor de glucosa en sangre y kit de suministros (contiene suministros y Teacher, music de Glass blower/designer) para medir su glucosa en sangre  Por favor contine tomando: -Diclofenaco (Voltaren) tableta de 75 mg (por va oral dos veces al da) para Conservation officer, historic buildings -Diclofenaco sdico (Voltaren dolor de artritis) gel al 1% (aplicar 4 g en la piel 4 veces al da) para Conservation officer, historic buildings -Enalapril (Vasotec) tableta de 20 mg (por va oral dos veces al da) para la presin arterial alta -Furosemida (Lasix) tableta de 40 mg (una vez por va oral al da segn sea necesario) para la hinchazn -Espironolactona (Aldactone) tableta de 25 mg (una vez por va oral al da) para la presin arterial alta  Hacer un seguimiento: Sherilyn Cooter un  seguimiento con su proveedor de atencin primaria, la Dra. Velna Ochs, en la [place] [facility] [place] 12/02/2021. Su proveedor de atencin primaria puede derivarlo a Banker.

## 2021-12-03 ENCOUNTER — Telehealth: Payer: Self-pay | Admitting: *Deleted

## 2021-12-03 ENCOUNTER — Other Ambulatory Visit: Payer: Self-pay | Admitting: Internal Medicine

## 2021-12-03 ENCOUNTER — Other Ambulatory Visit (HOSPITAL_COMMUNITY): Payer: Self-pay

## 2021-12-03 DIAGNOSIS — I10 Essential (primary) hypertension: Secondary | ICD-10-CM

## 2021-12-03 DIAGNOSIS — I5022 Chronic systolic (congestive) heart failure: Secondary | ICD-10-CM

## 2021-12-03 MED ORDER — FUROSEMIDE 40 MG PO TABS
40.0000 mg | ORAL_TABLET | Freq: Every day | ORAL | 1 refills | Status: DC | PRN
  Filled 2021-12-03 – 2022-01-07 (×2): qty 90, 90d supply, fill #0
  Filled 2022-04-24: qty 90, 90d supply, fill #1

## 2021-12-03 MED ORDER — SPIRONOLACTONE 25 MG PO TABS
25.0000 mg | ORAL_TABLET | Freq: Every day | ORAL | 1 refills | Status: DC
  Filled 2021-12-03 – 2022-01-07 (×2): qty 90, 90d supply, fill #0
  Filled 2022-04-24: qty 90, 90d supply, fill #1

## 2021-12-03 MED ORDER — ENALAPRIL MALEATE 20 MG PO TABS
20.0000 mg | ORAL_TABLET | Freq: Two times a day (BID) | ORAL | 1 refills | Status: DC
  Filled 2021-12-03 – 2022-01-07 (×2): qty 180, 90d supply, fill #0
  Filled 2022-06-14: qty 180, 90d supply, fill #1

## 2021-12-03 MED ORDER — BASAGLAR KWIKPEN 100 UNIT/ML ~~LOC~~ SOPN
25.0000 [IU] | PEN_INJECTOR | Freq: Every day | SUBCUTANEOUS | 2 refills | Status: DC
  Filled 2021-12-03: qty 15, 60d supply, fill #0

## 2021-12-03 NOTE — Telephone Encounter (Signed)
Patient was educated that each pen should last 12 days when he takes 25 units a day. He thought the pen had 100 unit sin it, I was abel to show him that it actually has 300 units in it so that 3 opens will last him 36 days. He verbalized understanding. He was also educated about taking extra supplies with him when traveling out of the country and to be sure to keep them in his carry on luggage. He showed me that he has extra test strips, pen needles, lancets and I encouraged him to also pick up the extra 3 pens at the pharmacy.  Phillip [name], RD 12/03/2021 11:18 AM.

## 2021-12-03 NOTE — Telephone Encounter (Signed)
Patient walked in stating he needs additional insulin pens as he is leaving for [place] in 2 days and will be gone 3-4 weeks. He was given 3 pens on 9/8. Today's Attending has sent in additional refills for this as well as vasotec, lasix, and spironolactone. Spoke with Trula Ore at [facility]. States she can do a vacation override with insurance co and he can have additional 3 pens for $50.   Patient currently speaking with Diabetic Educator as she says 1 pen should last 12 days; patient thought 1 pen would only last 4 days.  [employer] Interpreter, Karena Addison [id number], was used during these conversations.

## 2021-12-03 NOTE — Telephone Encounter (Signed)
Thank you team.  My math was terrible!  I appreciate your help Lupita Leash.

## 2021-12-27 ENCOUNTER — Encounter: Payer: Self-pay | Admitting: Dietician

## 2021-12-27 ENCOUNTER — Other Ambulatory Visit: Payer: Self-pay | Admitting: Dietician

## 2021-12-27 ENCOUNTER — Ambulatory Visit (INDEPENDENT_AMBULATORY_CARE_PROVIDER_SITE_OTHER): Payer: Commercial Managed Care - HMO | Admitting: Dietician

## 2021-12-27 DIAGNOSIS — E119 Type 2 diabetes mellitus without complications: Secondary | ICD-10-CM

## 2021-12-27 NOTE — Progress Notes (Signed)
Signed. Thank you.

## 2021-12-27 NOTE — Patient Instructions (Signed)
  Gracias por tu visita hoy!  Ests cuidando bien tu diabetes!  Por favor reduzca su insulina a 20 unidades a partir de [place].  Por favor lea el libro sobre la dieta para la diabetes.  Butch Penny [phone]

## 2021-12-27 NOTE — Progress Notes (Signed)
Referral request per triage nurse

## 2021-12-27 NOTE — Progress Notes (Signed)
Used interpreter [phone], Unknown Jim) for the entire visit  Diabetes Self-Management Education  Visit Type: First/Initial  Appt. Start Time: 1100 Appt. End Time: 0938  12/27/2021  [name], identified by name and date of birth, is a 56 y.o. male with a diagnosis of Diabetes:newly diagnosed last month while in the hospital with  Type 2.   ASSESSMENT   I was asked to see this patient emergently by the triage nurse for questions and concerns about his blood sugar and about his newly diagnosed diabetes. Referral requested  Estimated body mass index is 29.83 kg/m as calculated from the following:   Height as of 11/28/21: 5\' 8"  (1.727 m).   Weight as of 11/30/21: 196 lb 3.4 oz (89 kg).  Wt Readings from Last 10 Encounters:  11/30/21 196 lb 3.4 oz (89 kg)  11/28/21 190 lb 12.8 oz (86.5 kg)  11/14/21 208 lb (94.3 kg)  06/01/21 221 lb 6.4 oz (100.4 kg)  05/04/21 218 lb (98.9 kg)  05/01/21 220 lb 8 oz (100 kg)  04/25/21 221 lb 6.4 oz (100.4 kg)  03/08/21 220 lb 8 oz (100 kg)  02/09/21 221 lb 6.4 oz (100.4 kg)  01/17/20 220 lb 6.4 oz (100 kg)   Do not recommend weight gain. He could decrease his weight 5-15%if desired.   BP Readings from Last 3 Encounters:  11/30/21 125/67  11/28/21 134/80  11/14/21 117/71   Lab Results  Component Value Date   HGBA1C >14.0 11/28/2021   HGBA1C 6.5 (A) 03/08/2021   HGBA1C 5.8 03/02/2015   HGBA1C 5.8 (H) 05/02/2014    METER DOWNLOAD  Report summary is from last 30 days,  Average tests per day: 2.4 Average blood glucose: 162 Range: minimum: 66 and maximum: 326 Days without test: 0 % in target range: 68 % below target range: 1.4 % above target range: 30  hypoglycemia: x1 Notes about patterns: fairly steady pattern with rise in am and staying elevated ~ 180 most of the rest of the day   His recent blood sugars are Monday and Tuesday about 100-140                                           86/66  yesterday before meals- ate candy x 6  pieces after 66 with symptoms of tremors and being cold, then ate dinner and did not recheck  Today 96/125 before and after breakfast today and no insulin yet.   Spoke with Dr. Daryll Drown who asked him to decrease his insulin to 20 units daily and follow up next week with Dr. Marlou Sa.    Diabetes Self-Management Education - 12/27/21 1100       Visit Information   Visit Type First/Initial      Initial Visit   Diabetes Type Type 2    Date Diagnosed 11/2021    Are you currently following a meal plan? Yes    What type of meal plan do you follow? limiting portions    Are you taking your medications as prescribed? Yes      Health Coping   How would you rate your overall health? Good      Psychosocial Assessment   Patient Belief/Attitude about Diabetes Motivated to manage diabetes    What is the hardest part about your diabetes right now, causing you the most concern, or is the most worrisome to you about your diabetes?  Making healty food and beverage choices    Self-care barriers Lack of material resources;English as a second language    Self-management support Doctor's office;CDE visits    Other persons present Interpreter   on Ipad   Patient Concerns Problem Solving;Glycemic Control;Medication    Special Needs None    Preferred Learning Style No preference indicated    Learning Readiness Ready    How often do you need to have someone help you when you read instructions, pamphlets, or other written materials from your doctor or pharmacy? 2 - Rarely   uses Mychart   What is the last grade level you completed in school? need to assess at fu      Pre-Education Assessment   Patient understands the diabetes disease and treatment process. N/A (Comment)    Patient understands incorporating nutritional management into lifestyle. Needs Instruction    Patient undertands incorporating physical activity into lifestyle. Needs Instruction    Patient understands using medications safely. Needs Review     Patient understands monitoring blood glucose, interpreting and using results Needs Review    Patient understands prevention, detection, and treatment of acute complications. Needs Review    Patient understands prevention, detection, and treatment of chronic complications. Needs Review    Patient understands how to develop strategies to address psychosocial issues. Comprehends key points    Patient understands how to develop strategies to promote health/change behavior. Comprehends key points      Complications   Last HgB A1C per patient/outside source 14 %    How often do you check your blood sugar? 1-2 times/day    Fasting Blood glucose range (mg/dL) 70-129    Postprandial Blood glucose range (mg/dL) 130-179;180-200    Number of hypoglycemic episodes per month 1    Can you tell when your blood sugar is low? Yes    What do you do if your blood sugar is low? eats candy    Number of hyperglycemic episodes ( >200mg /dL): Weekly    Can you tell when your blood sugar is high? No    Have you had a dilated eye exam in the past 12 months? No    Have you had a dental exam in the past 12 months? No    Are you checking your feet? No      Dietary Intake   Breakfast chicken and rice, 2 oatmeal cookies   need to assess at future visit, short in time today helping him with low blood sugars, downoading meter, etc   Snack (morning) chicken and rice and 2 oatmeal cooki    Patent examiner, rice, vegetables    Beverage(s) water      Activity / Exercise   Activity / Exercise Type --   need to assess at future visit, short in time today helping him with low blood sugars, downoading meter, etc     Patient Education   Previous Diabetes Education Yes (please comment)   while in the hospital he learned how to inject insulin and check glucose   Disease Pathophysiology Explored patient's options for treatment of their diabetes    Medications Reviewed patients medication for diabetes, action, purpose, timing of  dose and side effects.    Monitoring Purpose and frequency of SMBG.;Taught/discussed recording of test results and interpretation of SMBG.;Identified appropriate SMBG and/or A1C goals.    Acute complications Taught prevention, symptoms, and  treatment of hypoglycemia - the 15 rule.      Individualized Goals (developed by patient)   Medications  take my medication as prescribed      Post-Education Assessment   Patient understands using medications safely. Comphrehends key points    Patient understands monitoring blood glucose, interpreting and using results Comprehends key points    Patient understands prevention, detection, and treatment of acute complications. Comprehends key points      Outcomes   Expected Outcomes Demonstrated interest in learning but significant barriers to change    Future DMSE 4-6 wks    Program Status Not Completed             Individualized Plan for Diabetes Self-Management Training:   Learning Objective:  Patient will have a greater understanding of diabetes self-management. Patient education plan is to attend individual and/or group sessions per assessed needs and concerns.   Plan:   Patient Instructions  [place] Pert por tu visita hoy!  Ests cuidando bien tu diabetes!  Por favor reduzca su insulina a 20 unidades a partir de [place].  Por favor lea el libro sobre la dieta para la diabetes.  Butch Penny [phone]   Expected Outcomes:  Demonstrated interest in learning but significant barriers to change  Education material provided: Diabetes Resources, Learning about type 2 diabetes in Spanish  If problems or questions, patient to contact team via:  Phone or mychart  Future DSME appointment: 4-6 wks Debera Lat, RD 12/27/2021 1:42 PM.

## 2021-12-31 ENCOUNTER — Ambulatory Visit (INDEPENDENT_AMBULATORY_CARE_PROVIDER_SITE_OTHER): Payer: Commercial Managed Care - HMO | Admitting: Internal Medicine

## 2021-12-31 ENCOUNTER — Other Ambulatory Visit (HOSPITAL_COMMUNITY): Payer: Self-pay

## 2021-12-31 VITALS — BP 135/72 | HR 70 | Temp 98.4°F | Wt 203.1 lb

## 2021-12-31 DIAGNOSIS — Z23 Encounter for immunization: Secondary | ICD-10-CM | POA: Diagnosis not present

## 2021-12-31 DIAGNOSIS — Z794 Long term (current) use of insulin: Secondary | ICD-10-CM | POA: Diagnosis not present

## 2021-12-31 DIAGNOSIS — I5022 Chronic systolic (congestive) heart failure: Secondary | ICD-10-CM

## 2021-12-31 DIAGNOSIS — E1165 Type 2 diabetes mellitus with hyperglycemia: Secondary | ICD-10-CM

## 2021-12-31 DIAGNOSIS — I110 Hypertensive heart disease with heart failure: Secondary | ICD-10-CM | POA: Diagnosis not present

## 2021-12-31 DIAGNOSIS — I10 Essential (primary) hypertension: Secondary | ICD-10-CM

## 2021-12-31 DIAGNOSIS — Z7984 Long term (current) use of oral hypoglycemic drugs: Secondary | ICD-10-CM

## 2021-12-31 LAB — GLUCOSE, CAPILLARY: Glucose-Capillary: 119 mg/dL — ABNORMAL HIGH (ref 70–99)

## 2021-12-31 MED ORDER — BASAGLAR KWIKPEN 100 UNIT/ML ~~LOC~~ SOPN
10.0000 [IU] | PEN_INJECTOR | Freq: Every day | SUBCUTANEOUS | 2 refills | Status: DC
  Filled 2021-12-31: qty 3, 30d supply, fill #0

## 2021-12-31 MED ORDER — METFORMIN HCL 500 MG PO TABS
1000.0000 mg | ORAL_TABLET | Freq: Two times a day (BID) | ORAL | 11 refills | Status: DC
  Filled 2021-12-31: qty 60, 15d supply, fill #0
  Filled 2021-12-31: qty 120, 30d supply, fill #0
  Filled 2022-02-11: qty 120, 30d supply, fill #1
  Filled 2022-03-21: qty 120, 30d supply, fill #2
  Filled 2022-04-24: qty 120, 30d supply, fill #3
  Filled 2022-05-27: qty 120, 30d supply, fill #4
  Filled 2022-07-17: qty 120, 30d supply, fill #5

## 2021-12-31 NOTE — Patient Instructions (Signed)
Dear Mr Phillip [name],  Thank you for trusting [place] with your care today. We discussed your diabetes.   We would like to start you on a medicine called Metformin. For the first week, please take 1 pill in the morning with food. For the second week, please take one pill in the morning and one at night with dinner. For the 3rd week, please take one in the morning and two at night. For the fourth week, please take 2 in the morning and 2 at night. We will decrease your insulin to 10 units daily. Please continue checking your blood sugar as you have been. We would like to see you back in 1 month for a diabetes follow up.  Nos gustara comenzar a darle un medicamento llamado Metformina. Durante la primera semana, tome 1 pastilla por la maana con alimentos. Durante la segunda semana, tome una pastilla por la maana y otra por la noche con la cena. Para la 3 semana, tome uno por la maana y dos por la noche. Para la cuarta semana, tome 2 por la maana y 2 por la noche. Disminuiremos su insulina a 10 unidades diarias. Por favor, contine controlando su nivel de azcar en la sangre como lo ha hecho. Nos gustara verlo de regreso en 1 mes para un seguimiento de la diabetes.

## 2021-12-31 NOTE — Progress Notes (Deleted)
SYST HF  HTN  NON-ISCHEMIC CARDIOMYOPATHY  T2DM  ANX/DEP  HLD  ENALAPRIL 20 LASIX 40 INSULIN GLARGINE 20 UNITS DAILY SPIRO 25 DAILY  URINE PRO SHINGLES COLON FLU

## 2021-12-31 NOTE — Progress Notes (Signed)
   CC: hfu  HPI:[name] is a 56 y.o. male who presents for evaluation of diabetes. Please see individual problem based A/P for details.  Patient had been having symptomatic hypoglycemia with insulin use. Initially on insulin glargine 25 which was titrated down to 20 after symptomatic low of 66. Had another episode of symptomatic low blood sugar  To 79(though not true hypoglycemia). He has been hesitant to use his insulin since these episodes. Has not used insulin two days prior due to fear of hypoglycemia. Blood sugar has been fairly well controlled with insulin use, avg has been 148, down from 253. He has never been on metformin.    Depression, PHQ-9: Based on the patients  [place] Visit from 05/01/2021 in [place]  PHQ-9 Total Score 10      score we have .  Past Medical History:  Diagnosis Date   Chronic back pain    "neck down" (07/20/2013)   Chronic systolic heart failure ([place]) 06/15/2014   Diabetes ([place]) 11/2021   GERD (gastroesophageal reflux disease)    High cholesterol    Hypertension    Renal calculus    Review of Systems:   See HPI  Physical Exam: Vitals:   12/31/21 1503  BP: 135/72  Pulse: 70  Temp: 98.4 F (36.9 C)  TempSrc: Oral  SpO2: 99%  Weight: 203 lb 1.6 oz (92.1 kg)     General: NAD HEENT: Conjunctiva nl , antiicteric sclerae, moist mucous membranes, no exudate or erythema Cardiovascular: Normal rate, regular rhythm.  No murmurs, rubs, or gallops Pulmonary : Equal breath sounds, No wheezes, rales, or rhonchi Abdominal: soft, nontender,  bowel sounds present Ext: No edema in lower extremities, no tenderness to palpation of lower extremities.   Assessment & Plan:   See Encounters Tab for problem based charting.  Plan to decrease insulin to 10 units and start on metformin. Instructed patient on how to titrate up metformin. Plan to titrate up to 1000mg  BID. Will plan to follow up in 1 month. He  may be able to manage disease with oral medicine in the future. Would like to titrate off of insulin if possible.  He has a history of heart failure and may benefit from initiation of SGLT2 inhibitor in the future.   Patient discussed with Dr. Evette Doffing

## 2022-01-01 LAB — BMP8+ANION GAP
Anion Gap: 14 mmol/L (ref 10.0–18.0)
BUN/Creatinine Ratio: 21 — ABNORMAL HIGH (ref 9–20)
BUN: 19 mg/dL (ref 6–24)
CO2: 27 mmol/L (ref 20–29)
Calcium: 9.6 mg/dL (ref 8.7–10.2)
Chloride: 101 mmol/L (ref 96–106)
Creatinine, Ser: 0.92 mg/dL (ref 0.76–1.27)
Glucose: 121 mg/dL — ABNORMAL HIGH (ref 70–99)
Potassium: 3.6 mmol/L (ref 3.5–5.2)
Sodium: 142 mmol/L (ref 134–144)
eGFR: 96 mL/min/{1.73_m2} (ref >59)

## 2022-01-03 ENCOUNTER — Encounter: Payer: Self-pay | Admitting: Internal Medicine

## 2022-01-03 NOTE — Assessment & Plan Note (Signed)
Asymptomatic, no complaints.  No evidence of volume overload on exam.  Continue current regimen, can consider adding sglt2 inhibitor in the future.

## 2022-01-03 NOTE — Assessment & Plan Note (Signed)
Patient had been having symptomatic hypoglycemia with insulin use. Initially on insulin glargine 25 which was titrated down to 20 after symptomatic low of 66. Had another episode of symptomatic low blood sugar  To 79(though not true hypoglycemia). He has been hesitant to use his insulin since these episodes. Has not used insulin two days prior due to fear of hypoglycemia. Blood sugar has been fairly well controlled with insulin use, avg has been 148, down from 253. He has never been on metformin.   Plan to decrease insulin to 10 units and start on metformin. Instructed patient on how to titrate up metformin. Plan to titrate up to 1000mg  BID. Will plan to follow up in 1 month. He may be able to manage disease with oral medicine in the future. Would like to titrate off of insulin if possible.  He has a history of heart failure and may benefit from initiation of SGLT2 inhibitor in the future.

## 2022-01-03 NOTE — Assessment & Plan Note (Signed)
>>  ASSESSMENT AND PLAN FOR CHRONIC SYSTOLIC HEART FAILURE (HCC) WRITTEN ON 01/03/2022  7:03 PM BY GRIFFITH PORTER, MD  Asymptomatic, no complaints.  No evidence of volume overload on exam.  Continue current regimen, can consider adding sglt2 inhibitor in the future.

## 2022-01-04 NOTE — Progress Notes (Signed)
Internal Medicine Clinic Attending ° °Case discussed with Dr. Gawaluck  At the time of the visit.  We reviewed the resident’s history and exam and pertinent patient test results.  I agree with the assessment, diagnosis, and plan of care documented in the resident’s note.  °

## 2022-01-07 ENCOUNTER — Other Ambulatory Visit (HOSPITAL_COMMUNITY): Payer: Self-pay

## 2022-01-07 ENCOUNTER — Encounter: Payer: Self-pay | Admitting: Internal Medicine

## 2022-01-08 ENCOUNTER — Encounter: Payer: Self-pay | Admitting: Dietician

## 2022-01-08 ENCOUNTER — Other Ambulatory Visit: Payer: Self-pay | Admitting: Internal Medicine

## 2022-01-08 ENCOUNTER — Other Ambulatory Visit: Payer: Self-pay

## 2022-01-08 ENCOUNTER — Other Ambulatory Visit (HOSPITAL_COMMUNITY): Payer: Self-pay

## 2022-01-08 NOTE — Telephone Encounter (Signed)
I think he needs lancets, it does not look like they were dispensed, but he also may need pen needles. Our pharmacy is sending  you and Dr. Leroy Sea a refill request on both to be sure we get the right ones for his meter and he has refills on both.

## 2022-01-08 NOTE — Telephone Encounter (Signed)
Patient is requesting Pen Needles. Need to go to [facility].

## 2022-01-09 ENCOUNTER — Other Ambulatory Visit (HOSPITAL_COMMUNITY): Payer: Self-pay

## 2022-01-09 MED ORDER — PEN NEEDLES 32G X 4 MM MISC
Freq: Every day | 2 refills | Status: DC
Start: 2022-01-09 — End: 2023-05-05
  Filled 2022-01-09: qty 100, 90d supply, fill #0

## 2022-01-09 MED ORDER — ONETOUCH DELICA PLUS LANCET33G MISC
Freq: Four times a day (QID) | 2 refills | Status: DC
  Filled 2022-01-09: qty 100, 25d supply, fill #0
  Filled 2022-02-11 – 2022-03-21 (×2): qty 100, 25d supply, fill #1

## 2022-01-10 ENCOUNTER — Other Ambulatory Visit (HOSPITAL_COMMUNITY): Payer: Self-pay

## 2022-02-07 ENCOUNTER — Encounter: Payer: Self-pay | Admitting: Dietician

## 2022-02-12 ENCOUNTER — Other Ambulatory Visit: Payer: Self-pay

## 2022-02-12 ENCOUNTER — Other Ambulatory Visit (HOSPITAL_COMMUNITY): Payer: Self-pay

## 2022-02-18 ENCOUNTER — Other Ambulatory Visit (HOSPITAL_COMMUNITY): Payer: Self-pay

## 2022-02-18 MED ORDER — ONETOUCH VERIO VI STRP
ORAL_STRIP | 0 refills | Status: DC
  Filled 2022-02-18: qty 100, 25d supply, fill #0

## 2022-03-04 ENCOUNTER — Telehealth: Payer: Self-pay | Admitting: *Deleted

## 2022-03-04 NOTE — Telephone Encounter (Signed)
Agree with holding insulin, thank you

## 2022-03-04 NOTE — Telephone Encounter (Signed)
Patient walked in this am c/o being unable to use his Insulin pens since November.  Patient stated problems with getting Insulin to come out.  Has only been taking the pills only.  Glucose levels have been in the 110-128, Has even gone down to 80.  Patient stated only high when he has had sweets.  Presented to Dr. Sol Blazing. Who stated that patient should continue to only take the pill and to make an appointment to discuss options.  Patient scheduled an appointment for 03/13/2022.  [employer]  translator Laqueta Jean [phone] was utilized for discussion.

## 2022-03-13 ENCOUNTER — Encounter: Payer: Commercial Managed Care - HMO | Admitting: Internal Medicine

## 2022-03-21 ENCOUNTER — Other Ambulatory Visit (HOSPITAL_COMMUNITY): Payer: Self-pay

## 2022-04-03 ENCOUNTER — Ambulatory Visit: Payer: No Typology Code available for payment source | Admitting: Internal Medicine

## 2022-04-03 ENCOUNTER — Other Ambulatory Visit (HOSPITAL_COMMUNITY): Payer: Self-pay

## 2022-04-03 ENCOUNTER — Other Ambulatory Visit: Payer: Self-pay

## 2022-04-03 ENCOUNTER — Encounter: Payer: Self-pay | Admitting: Internal Medicine

## 2022-04-03 VITALS — BP 120/77 | HR 77 | Temp 97.8°F | Ht 68.0 in | Wt 198.7 lb

## 2022-04-03 DIAGNOSIS — Z794 Long term (current) use of insulin: Secondary | ICD-10-CM

## 2022-04-03 DIAGNOSIS — I10 Essential (primary) hypertension: Secondary | ICD-10-CM | POA: Diagnosis not present

## 2022-04-03 DIAGNOSIS — Z1211 Encounter for screening for malignant neoplasm of colon: Secondary | ICD-10-CM | POA: Insufficient documentation

## 2022-04-03 DIAGNOSIS — E1165 Type 2 diabetes mellitus with hyperglycemia: Secondary | ICD-10-CM

## 2022-04-03 DIAGNOSIS — E785 Hyperlipidemia, unspecified: Secondary | ICD-10-CM | POA: Diagnosis not present

## 2022-04-03 DIAGNOSIS — A084 Viral intestinal infection, unspecified: Secondary | ICD-10-CM | POA: Diagnosis not present

## 2022-04-03 DIAGNOSIS — Z7984 Long term (current) use of oral hypoglycemic drugs: Secondary | ICD-10-CM

## 2022-04-03 LAB — POCT GLYCOSYLATED HEMOGLOBIN (HGB A1C): Hemoglobin A1C: 6.3 % — AB (ref 4.0–5.6)

## 2022-04-03 LAB — GLUCOSE, CAPILLARY: Glucose-Capillary: 109 mg/dL — ABNORMAL HIGH (ref 70–99)

## 2022-04-03 MED ORDER — ONDANSETRON HCL 8 MG PO TABS
8.0000 mg | ORAL_TABLET | Freq: Three times a day (TID) | ORAL | 0 refills | Status: DC | PRN
  Filled 2022-04-03: qty 20, 7d supply, fill #0

## 2022-04-03 MED ORDER — ONETOUCH VERIO VI STRP
ORAL_STRIP | 2 refills | Status: DC
  Filled 2022-04-03: qty 100, 25d supply, fill #0
  Filled 2022-05-15: qty 100, 25d supply, fill #1
  Filled 2022-07-17: qty 100, 25d supply, fill #2

## 2022-04-03 MED ORDER — ROSUVASTATIN CALCIUM 20 MG PO TABS
20.0000 mg | ORAL_TABLET | Freq: Every day | ORAL | 11 refills | Status: DC
  Filled 2022-04-03: qty 30, 30d supply, fill #0
  Filled 2022-05-01: qty 30, 30d supply, fill #1
  Filled 2022-05-30: qty 30, 30d supply, fill #2
  Filled 2022-12-16: qty 30, 30d supply, fill #3

## 2022-04-03 NOTE — Patient Instructions (Signed)
[name],  It was a pleasure to see you today. Please follow up with me again in 3 months. If you have any questions or concerns, call our clinic at [phone] or after hours call [phone] and ask for the internal medicine resident on call.   Thank you!  Dr. Darnell Level

## 2022-04-03 NOTE — Progress Notes (Signed)
Subjective:   Patient ID: Phillip [name] male   DOB: April 09, 1965 57 y.o.   MRN: [id number]  HPI: [name] is a 57 y.o. male with past medical history outlined below here for follow up of his diabetes. He has been doing well since hospital discharge. Went to [place] for three weeks. Overall has been feeling well since he returned but discontinued insulin due to symptomatic hypoglycemia. He is compliant with his other medications.    Past Medical History:  Diagnosis Date   Chronic back pain    "neck down" (07/20/2013)   Chronic systolic heart failure ([place]) 06/15/2014   Diabetes ([place]) 11/2021   GERD (gastroesophageal reflux disease)    High cholesterol    Hypertension    Renal calculus    Current Outpatient Medications  Medication Sig Dispense Refill   ondansetron (ZOFRAN) 8 MG tablet Take 1 tablet (8 mg total) by mouth every 8 (eight) hours as needed for nausea or vomiting. 20 tablet 0   rosuvastatin (CRESTOR) 20 MG tablet Take 1 tablet (20 mg total) by mouth daily. 30 tablet 11   blood glucose meter kit and supplies KIT Use up to four times daily as directed. 1 each 0   enalapril (VASOTEC) 20 MG tablet Take 1 tablet (20 mg total) by mouth 2 (two) times daily. 180 tablet 1   furosemide (LASIX) 40 MG tablet Take 1 tablet (40 mg total) by mouth daily as needed. 90 tablet 1   glucose blood (ONETOUCH VERIO) test strip Use as directed up to four times daily 100 each 2   Insulin Pen Needle (PEN NEEDLES) 32G X 4 MM MISC Use as directed. 100 each 2   Lancets (ONETOUCH DELICA PLUS [id number]) MISC Use up to 4 (four) times daily as directed 100 each 2   Lancets 33G MISC Use as directed up to four times daily 100 each 0   metFORMIN (GLUCOPHAGE) 500 MG tablet Take 2 tablets (1,000 mg total) by mouth 2 (two) times daily with a meal. 120 tablet 11   spironolactone (ALDACTONE) 25 MG tablet Take 1 tablet (25 mg total) by mouth daily. 90 tablet 1   No current facility-administered  medications for this visit.   Family History  Problem Relation Age of Onset   Hypertension Mother     Objective:  Physical Exam:  Vitals:   04/03/22 0849  BP: 120/77  Pulse: 77  Temp: 97.8 F (36.6 C)  TempSrc: Oral  SpO2: 99%  Weight: 198 lb 11.2 oz (90.1 kg)  Height: 5\' 8"  (1.727 m)   Constitutional: NAD, appears well  Cardiovascular: RRR, no m/r/g Pulmonary/Chest: Clear, normal effort Extremities: Warm and well perfused, no edema   Assessment & Plan:   Type 2 diabetes mellitus (HCC) Glycemic control is much improved.  Review of his glucometer shows a total of 63 readings over the past 30 days.  His average CBG is 113 and he is within target 95% of the time.  Repeat hemoglobin A1c today is 6.3 down from greater than 14 at his last check.  He denies further episodes of dizziness/syncope.  Plan to continue metformin 1000 mg twice daily alone without insulin.  We discussed discontinuing CBG monitoring now that he is no longer on insulin, however he feels this helps him be more compliant with his low carbohydrate diet.  I have refilled his test strips.  We are checking urine microalbumin today, can consider addition of an SGLT2 inhibitor pending results.  He also carries a  diagnosis of nonischemic HFrEF.  Hyperlipidemia Last LDL checked was elevated at 117 with a 10-year ASCVD risk of 17.4.  I recommended we initiate high intensity statin for primary CVD prevention, patient is agreeable.  Sent Rx for Crestor 20 mg daily.  Repeat lipid panel at follow-up.  Essential hypertension Chronic and well-controlled on enalapril 20 mg twice daily, spironolactone 25 mg daily, and Lasix PRN for his nonischemic HFrEF.  No beta-blockers due to bradycardia.  Colon cancer screening Agreeable to repeat immunochemical FOBT for colon cancer screening.  Viral gastroenteritis Patient is complaining of 1 day of abdominal pain with associated nausea and feeling like he might vomit.  Requesting a work  note to be excused for the day which I provided.  Also sent Rx for PRN Zofran.

## 2022-04-03 NOTE — Assessment & Plan Note (Signed)
Last LDL checked was elevated at 117 with a 10-year ASCVD risk of 17.4.  I recommended we initiate high intensity statin for primary CVD prevention, patient is agreeable.  Sent Rx for Crestor 20 mg daily.  Repeat lipid panel at follow-up.

## 2022-04-03 NOTE — Assessment & Plan Note (Signed)
Chronic and well-controlled on enalapril 20 mg twice daily, spironolactone 25 mg daily, and Lasix PRN for his nonischemic HFrEF.  No beta-blockers due to bradycardia.

## 2022-04-03 NOTE — Assessment & Plan Note (Signed)
Glycemic control is much improved.  Review of his glucometer shows a total of 63 readings over the past 30 days.  His average CBG is 113 and he is within target 95% of the time.  Repeat hemoglobin A1c today is 6.3 down from greater than 14 at his last check.  He denies further episodes of dizziness/syncope.  Plan to continue metformin 1000 mg twice daily alone without insulin.  We discussed discontinuing CBG monitoring now that he is no longer on insulin, however he feels this helps him be more compliant with his low carbohydrate diet.  I have refilled his test strips.  We are checking urine microalbumin today, can consider addition of an SGLT2 inhibitor pending results.  He also carries a diagnosis of nonischemic HFrEF.

## 2022-04-03 NOTE — Assessment & Plan Note (Signed)
Agreeable to repeat immunochemical FOBT for colon cancer screening.

## 2022-04-03 NOTE — Assessment & Plan Note (Signed)
Patient is complaining of 1 day of abdominal pain with associated nausea and feeling like he might vomit.  Requesting a work note to be excused for the day which I provided.  Also sent Rx for PRN Zofran.

## 2022-04-04 LAB — BMP8+ANION GAP
Anion Gap: 17 mmol/L (ref 10.0–18.0)
BUN/Creatinine Ratio: 27 — ABNORMAL HIGH (ref 9–20)
BUN: 29 mg/dL — ABNORMAL HIGH (ref 6–24)
CO2: 21 mmol/L (ref 20–29)
Calcium: 9.6 mg/dL (ref 8.7–10.2)
Chloride: 98 mmol/L (ref 96–106)
Creatinine, Ser: 1.08 mg/dL (ref 0.76–1.27)
Glucose: 102 mg/dL — ABNORMAL HIGH (ref 70–99)
Potassium: 4.3 mmol/L (ref 3.5–5.2)
Sodium: 136 mmol/L (ref 134–144)
eGFR: 80 mL/min/{1.73_m2} (ref >59)

## 2022-04-04 LAB — MICROALBUMIN / CREATININE URINE RATIO
Creatinine, Urine: 21.1 mg/dL
Microalb/Creat Ratio: <14 mg/g creat (ref 0–29)
Microalbumin, Urine: <3 ug/mL

## 2022-04-24 ENCOUNTER — Other Ambulatory Visit: Payer: No Typology Code available for payment source

## 2022-04-24 ENCOUNTER — Other Ambulatory Visit (HOSPITAL_COMMUNITY): Payer: Self-pay

## 2022-04-24 DIAGNOSIS — Z1211 Encounter for screening for malignant neoplasm of colon: Secondary | ICD-10-CM

## 2022-04-25 LAB — FECAL OCCULT BLOOD, IMMUNOCHEMICAL: Fecal Occult Bld: NEGATIVE

## 2022-05-01 ENCOUNTER — Other Ambulatory Visit (HOSPITAL_COMMUNITY): Payer: Self-pay

## 2022-05-06 ENCOUNTER — Encounter (HOSPITAL_COMMUNITY): Payer: Self-pay

## 2022-05-06 ENCOUNTER — Emergency Department (HOSPITAL_COMMUNITY): Payer: No Typology Code available for payment source

## 2022-05-06 ENCOUNTER — Other Ambulatory Visit: Payer: Self-pay

## 2022-05-06 ENCOUNTER — Emergency Department (HOSPITAL_COMMUNITY): Payer: Worker's Compensation

## 2022-05-06 ENCOUNTER — Emergency Department (HOSPITAL_COMMUNITY)
Admission: EM | Admit: 2022-05-06 | Discharge: 2022-05-06 | Disposition: A | Discharge status: Home / Self Care | Payer: Worker's Compensation | Attending: Emergency Medicine | Admitting: Emergency Medicine

## 2022-05-06 DIAGNOSIS — S060X0A Concussion without loss of consciousness, initial encounter: Secondary | ICD-10-CM | POA: Diagnosis not present

## 2022-05-06 DIAGNOSIS — Y9289 Other specified places as the place of occurrence of the external cause: Secondary | ICD-10-CM | POA: Diagnosis not present

## 2022-05-06 DIAGNOSIS — S29012A Strain of muscle and tendon of back wall of thorax, initial encounter: Secondary | ICD-10-CM | POA: Diagnosis not present

## 2022-05-06 DIAGNOSIS — S0990XA Unspecified injury of head, initial encounter: Secondary | ICD-10-CM | POA: Diagnosis present

## 2022-05-06 DIAGNOSIS — Z794 Long term (current) use of insulin: Secondary | ICD-10-CM | POA: Diagnosis not present

## 2022-05-06 DIAGNOSIS — Y990 Civilian activity done for income or pay: Secondary | ICD-10-CM | POA: Insufficient documentation

## 2022-05-06 DIAGNOSIS — W228XXA Striking against or struck by other objects, initial encounter: Secondary | ICD-10-CM | POA: Diagnosis not present

## 2022-05-06 DIAGNOSIS — S39012A Strain of muscle, fascia and tendon of lower back, initial encounter: Secondary | ICD-10-CM

## 2022-05-06 DIAGNOSIS — Y9301 Activity, walking, marching and hiking: Secondary | ICD-10-CM | POA: Insufficient documentation

## 2022-05-06 DIAGNOSIS — S0003XA Contusion of scalp, initial encounter: Secondary | ICD-10-CM | POA: Diagnosis not present

## 2022-05-06 LAB — CBC
HCT: 41.7 % (ref 39.0–52.0)
Hemoglobin: 14.1 g/dL (ref 13.0–17.0)
MCH: 29.1 pg (ref 26.0–34.0)
MCHC: 33.8 g/dL (ref 30.0–36.0)
MCV: 86 fL (ref 80.0–100.0)
Platelets: 324 10*3/uL (ref 150–400)
RBC: 4.85 MIL/uL (ref 4.22–5.81)
RDW: 13.8 % (ref 11.5–15.5)
WBC: 11.1 10*3/uL — ABNORMAL HIGH (ref 4.0–10.5)
nRBC: 0 % (ref 0.0–0.2)

## 2022-05-06 LAB — BASIC METABOLIC PANEL
Anion gap: 11 (ref 5–15)
BUN: 27 mg/dL — ABNORMAL HIGH (ref 6–20)
CO2: 26 mmol/L (ref 22–32)
Calcium: 9.4 mg/dL (ref 8.9–10.3)
Chloride: 101 mmol/L (ref 98–111)
Creatinine, Ser: 1.08 mg/dL (ref 0.61–1.24)
GFR, Estimated: >60 mL/min (ref >60)
Glucose, Bld: 97 mg/dL (ref 70–99)
Potassium: 3.9 mmol/L (ref 3.5–5.1)
Sodium: 138 mmol/L (ref 135–145)

## 2022-05-06 LAB — CBG MONITORING, ED: Glucose-Capillary: 98 mg/dL (ref 70–99)

## 2022-05-06 MED ORDER — METOCLOPRAMIDE HCL 5 MG/ML IJ SOLN
10.0000 mg | Freq: Once | INTRAMUSCULAR | Status: AC
  Administered 2022-05-06: 10 mg via INTRAVENOUS
  Filled 2022-05-06: qty 2

## 2022-05-06 MED ORDER — DIPHENHYDRAMINE HCL 50 MG/ML IJ SOLN
25.0000 mg | Freq: Once | INTRAMUSCULAR | Status: AC
  Administered 2022-05-06: 25 mg via INTRAVENOUS
  Filled 2022-05-06: qty 1

## 2022-05-06 MED ORDER — ACETAMINOPHEN 325 MG PO TABS
650.0000 mg | ORAL_TABLET | Freq: Once | ORAL | Status: AC
  Administered 2022-05-06: 650 mg via ORAL
  Filled 2022-05-06: qty 2

## 2022-05-06 MED ORDER — SODIUM CHLORIDE 0.9 % IV BOLUS
1000.0000 mL | Freq: Once | INTRAVENOUS | Status: AC
  Administered 2022-05-06: 1000 mL via INTRAVENOUS

## 2022-05-06 NOTE — ED Notes (Signed)
Patient transported to X-ray 

## 2022-05-06 NOTE — ED Provider Triage Note (Signed)
Emergency Medicine Provider Triage Evaluation Note  Phillip [name] , a 57 y.o. male  was evaluated in triage.  Pt complains of head pain and nausea following an injury today at work.  Patient states he was walking by pipes when he hit his head on 1, fell backward, hit his head on the ground, lost consciousness.  Stay down there for "some time".  Endorses dizziness and nausea, denies vomiting.  States he does not feel confused, though feels very tired.  Denies anticoagulation.  Review of Systems  Positive:  Negative: See above  Physical Exam  BP 136/81   Pulse 73   Temp 98.3 F (36.8 C)   Resp 18   Ht 5' 8"$  (1.727 m)   Wt 90.1 kg   SpO2 98%   BMI 30.20 kg/m  Gen:   Awake, no distress   Resp:  Normal effort  MSK:   Moves extremities without difficulty  Other:  Answers mildly delayed.  AAOx4.  Answers questions appropriately.  Hematoma appreciated on the left medial crown without open wounds/hemorrhage.  PERRLA.  Eye patch on right eye at baseline.  Medical Decision Making  Medically screening exam initiated at 3:48 PM.  Appropriate orders placed.  Phillip [name] was informed that the remainder of the evaluation will be completed by another provider, this initial triage assessment does not replace that evaluation, and the importance of remaining in the ED until their evaluation is complete.  Tele-translator utilized for duration of encounter.   Phillip Rome, PA-C [phone]

## 2022-05-06 NOTE — ED Provider Notes (Signed)
[name] Provider Note   CSN: [id number] Arrival date & time: 05/06/22  1517     History  No chief complaint on file.   Phillip [name] is a 57 y.o. male here presenting with head injury.  Patient states that he was at work and he hit his head on a pipe.  He states that this happened around 1 PM.  He states that he felt lightheaded dizzy afterwards.  Also has some upper back pain.  The history is provided by the patient.       Home Medications Prior to Admission medications   Medication Sig Start Date End Date Taking? Authorizing Provider  blood glucose meter kit and supplies KIT Use up to four times daily as directed. 11/30/21   Mapp, Claudia Desanctis, MD  enalapril (VASOTEC) 20 MG tablet Take 1 tablet (20 mg total) by mouth 2 (two) times daily. 12/03/21   Sid Falcon, MD  furosemide (LASIX) 40 MG tablet Take 1 tablet (40 mg total) by mouth daily as needed. 12/03/21   Sid Falcon, MD  glucose blood [facility] VERIO) test strip Use as directed up to four times daily 04/03/22   Velna Ochs, MD  Insulin Pen Needle (PEN NEEDLES) 32G X 4 MM MISC Use as directed. 01/09/22   Velna Ochs, MD  Lancets [facility] DELICA PLUS [id number]) MISC Use up to 4 (four) times daily as directed 01/09/22   Velna Ochs, MD  Lancets 33G MISC Use as directed up to four times daily 11/30/21   Mapp, Claudia Desanctis, MD  metFORMIN (GLUCOPHAGE) 500 MG tablet Take 2 tablets (1,000 mg total) by mouth 2 (two) times daily with a meal. 12/31/21   Delene Ruffini, MD  ondansetron (ZOFRAN) 8 MG tablet Take 1 tablet (8 mg total) by mouth every 8 (eight) hours as needed for nausea or vomiting. 04/03/22   Velna Ochs, MD  rosuvastatin (CRESTOR) 20 MG tablet Take 1 tablet (20 mg total) by mouth daily. 04/03/22   Velna Ochs, MD  spironolactone (ALDACTONE) 25 MG tablet Take 1 tablet (25 mg total) by mouth daily. 12/03/21   Sid Falcon, MD  Insulin Glargine  [facility] KWIKPEN) 100 UNIT/ML Inject 10 Units into the skin daily. 12/31/21 03/21/22  Delene Ruffini, MD      Allergies    Patient has no known allergies.    Review of Systems   Review of Systems  Musculoskeletal:  Positive for back pain.  Neurological:  Positive for dizziness and headaches.  All other systems reviewed and are negative.   Physical Exam Updated Vital Signs BP 136/81   Pulse 73   Temp 98.3 F (36.8 C)   Resp 18   Ht 5' 8"$  (1.727 m)   Wt 90.1 kg   SpO2 98%   BMI 30.20 kg/m  Physical Exam Vitals and nursing note reviewed.  Constitutional:      Appearance: Normal appearance.  HENT:     Head:     Comments: Left posterior scalp hematoma.  No obvious laceration    Nose: Nose normal.     Mouth/Throat:     Mouth: Mucous membranes are moist.  Eyes:     Extraocular Movements: Extraocular movements intact.     Pupils: Pupils are equal, round, and reactive to light.  Cardiovascular:     Rate and Rhythm: Normal rate and regular rhythm.     Pulses: Normal pulses.     Heart sounds: Normal heart sounds.  Pulmonary:  Effort: Pulmonary effort is normal.     Breath sounds: Normal breath sounds.  Abdominal:     General: Abdomen is flat.     Palpations: Abdomen is soft.  Musculoskeletal:     Cervical back: Normal range of motion and neck supple.     Comments: Upper thoracic tenderness.  No midline or lower lumbar tenderness  Skin:    Capillary Refill: Capillary refill takes less than 2 seconds.  Neurological:     General: No focal deficit present.     Mental Status: He is alert and oriented to person, place, and time.  Psychiatric:        Mood and Affect: Mood normal.        Behavior: Behavior normal.     ED Results / Procedures / Treatments   Labs (all labs ordered are listed, but only abnormal results are displayed) Labs Reviewed  BASIC METABOLIC PANEL - Abnormal; Notable for the following components:      Result Value   BUN 27 (*)    All other  components within normal limits  CBC - Abnormal; Notable for the following components:   WBC 11.1 (*)    All other components within normal limits  CBG MONITORING, ED    EKG None  Radiology No results found.  Procedures Procedures    Medications Ordered in ED Medications  metoCLOPramide (REGLAN) injection 10 mg (10 mg Intravenous Given 05/06/22 1707)  diphenhydrAMINE (BENADRYL) injection 25 mg (25 mg Intravenous Given 05/06/22 1707)  sodium chloride 0.9 % bolus 1,000 mL (1,000 mLs Intravenous New Bag/Given 05/06/22 1706)  acetaminophen (TYLENOL) tablet 650 mg (650 mg Oral Given 05/06/22 1708)    ED Course/ Medical Decision Making/ A&P                             Medical Decision Making Phillip [name] is a 57 y.o. male here presenting with back pain and headache and dizziness after head injury.  I think likely postconcussive syndrome.  Given that he has nausea and feels unsteady, will get CT head to rule out a bleed or fracture.  Will give migraine cocktail and reassess  7:22 PM Labs unremarkable.  CT head and cervical spine unremarkable.  X-rays did not show any fractures.  Patient is feeling better and stable for discharge.   Problems Addressed: Back strain, initial encounter: acute illness or injury Concussion without loss of consciousness, initial encounter: acute illness or injury  Amount and/or Complexity of Data Reviewed Labs: ordered. Decision-making details documented in ED Course. Radiology: ordered and independent interpretation performed. Decision-making details documented in ED Course.  Risk OTC drugs. Prescription drug management.    Final Clinical Impression(s) / ED Diagnoses Final diagnoses:  None    Rx / DC Orders ED Discharge Orders     None         Drenda Freeze, MD 05/06/22 [phone]

## 2022-05-06 NOTE — ED Triage Notes (Signed)
Pt was walking in a basement and pipes were hanging from a ceiling and he hit his head on the pipes and fell back and then had a brief syncopal episode, confused. Pt states he had a HA that radiates to neck, dizziness, nausea, fatigue. Pt denies blood thinners. Pt is A&Ox4, but slow to answer questions.

## 2022-05-06 NOTE — Discharge Instructions (Addendum)
You have a normal CT scan.  You likely have a concussion and may have headache and dizziness.  Take Tylenol or Motrin for headaches.  Please rest at home for 2 days  See your doctor for follow-up  Return to ER if you have worse headache, dizziness, passing out

## 2022-05-15 ENCOUNTER — Other Ambulatory Visit (HOSPITAL_COMMUNITY): Payer: Self-pay

## 2022-05-27 ENCOUNTER — Other Ambulatory Visit (HOSPITAL_COMMUNITY): Payer: Self-pay

## 2022-05-30 ENCOUNTER — Other Ambulatory Visit (HOSPITAL_COMMUNITY): Payer: Self-pay

## 2022-06-04 MED ORDER — PROPOFOL 10 MG/ML IV BOLUS
INTRAVENOUS | Status: AC
  Filled 2022-06-04: qty 20

## 2022-06-14 ENCOUNTER — Other Ambulatory Visit (HOSPITAL_COMMUNITY): Payer: Self-pay

## 2022-07-17 ENCOUNTER — Encounter: Payer: No Typology Code available for payment source | Admitting: Internal Medicine

## 2022-07-17 ENCOUNTER — Other Ambulatory Visit: Payer: Self-pay | Admitting: Internal Medicine

## 2022-07-17 DIAGNOSIS — I10 Essential (primary) hypertension: Secondary | ICD-10-CM

## 2022-07-17 DIAGNOSIS — I5022 Chronic systolic (congestive) heart failure: Secondary | ICD-10-CM

## 2022-07-18 ENCOUNTER — Other Ambulatory Visit (HOSPITAL_COMMUNITY): Payer: Self-pay

## 2022-07-18 ENCOUNTER — Other Ambulatory Visit: Payer: Self-pay

## 2022-07-18 MED ORDER — SPIRONOLACTONE 25 MG PO TABS
25.0000 mg | ORAL_TABLET | Freq: Every day | ORAL | 1 refills | Status: DC
  Filled 2022-07-18: qty 90, 90d supply, fill #0
  Filled 2022-10-23: qty 90, 90d supply, fill #1

## 2022-08-07 ENCOUNTER — Other Ambulatory Visit: Payer: Self-pay

## 2022-08-07 ENCOUNTER — Ambulatory Visit: Payer: No Typology Code available for payment source | Admitting: Internal Medicine

## 2022-08-07 ENCOUNTER — Encounter: Payer: Self-pay | Admitting: Internal Medicine

## 2022-08-07 VITALS — BP 112/67 | HR 73 | Temp 98.7°F | Ht 68.0 in | Wt 198.8 lb

## 2022-08-07 DIAGNOSIS — I10 Essential (primary) hypertension: Secondary | ICD-10-CM | POA: Diagnosis not present

## 2022-08-07 DIAGNOSIS — I428 Other cardiomyopathies: Secondary | ICD-10-CM | POA: Diagnosis not present

## 2022-08-07 DIAGNOSIS — E785 Hyperlipidemia, unspecified: Secondary | ICD-10-CM

## 2022-08-07 DIAGNOSIS — Z794 Long term (current) use of insulin: Secondary | ICD-10-CM

## 2022-08-07 DIAGNOSIS — E1165 Type 2 diabetes mellitus with hyperglycemia: Secondary | ICD-10-CM

## 2022-08-07 DIAGNOSIS — E7841 Elevated Lipoprotein(a): Secondary | ICD-10-CM

## 2022-08-07 DIAGNOSIS — Z7984 Long term (current) use of oral hypoglycemic drugs: Secondary | ICD-10-CM

## 2022-08-07 LAB — POCT GLYCOSYLATED HEMOGLOBIN (HGB A1C): Hemoglobin A1C: 5.7 % — AB (ref 4.0–5.6)

## 2022-08-07 LAB — GLUCOSE, CAPILLARY: Glucose-Capillary: 118 mg/dL — ABNORMAL HIGH (ref 70–99)

## 2022-08-07 MED ORDER — METFORMIN HCL 500 MG PO TABS: 1000.0000 mg | ORAL_TABLET | Freq: Every day | ORAL | 3 refills | Status: DC

## 2022-08-07 NOTE — Assessment & Plan Note (Signed)
He does not believe he is taking his Crestor.  We are rechecking lipid panel today and advised he should likely be on a statin given his diabetes.  Will follow-up his results and plan to restart rosuvastatin 20 mg daily.

## 2022-08-07 NOTE — Assessment & Plan Note (Signed)
Excellent control, hemoglobin A1c today is 5.7.  He has made multiple positive changes to his diet including smaller portions, cutting out sodas and sweets.  He is no longer taking insulin but still checks his blood sugar at home.  Does not have his meter with him today but reports fasting numbers between 90-120.  No episodes of hypoglycemia.  His lowest value that he can recall was 77.  We discussed continuing metformin versus decreasing to 1000 mg once daily.  He would like to try tapering down on metformin and continuing with his dietary changes.  Will plan to see him back in 3 months for repeat hemoglobin A1c. --Foot exam today was normal --Decrease metformin to 1000 mg daily --Follow-up in 3 months

## 2022-08-07 NOTE — Progress Notes (Signed)
Subjective:   Patient ID: Phillip [name] male   DOB: 12-20-1965 57 y.o.   MRN: [id number]  HPI: Mr.Amoni D [name] is a 57 y.o. male with past medical history outlined below here for DM follow up. For further details of today's visit, please refer to the assessment and plan below.   Past Medical History:  Diagnosis Date   Chronic back pain    "neck down" (07/20/2013)   Chronic systolic heart failure (HCC) 06/15/2014   Diabetes (HCC) 11/2021   GERD (gastroesophageal reflux disease)    High cholesterol    Hypertension    Renal calculus    Current Outpatient Medications  Medication Sig Dispense Refill   blood glucose meter kit and supplies KIT Use up to four times daily as directed. 1 each 0   enalapril (VASOTEC) 20 MG tablet Take 1 tablet (20 mg total) by mouth 2 (two) times daily. 180 tablet 1   furosemide (LASIX) 40 MG tablet Take 1 tablet (40 mg total) by mouth daily as needed. 90 tablet 1   glucose blood (ONETOUCH VERIO) test strip Use as directed up to four times daily 100 each 2   Insulin Pen Needle (PEN NEEDLES) 32G X 4 MM MISC Use as directed. 100 each 2   Lancets (ONETOUCH DELICA PLUS LANCET33G) MISC Use up to 4 (four) times daily as directed 100 each 2   Lancets 33G MISC Use as directed up to four times daily 100 each 0   metFORMIN (GLUCOPHAGE) 500 MG tablet Take 2 tablets (1,000 mg total) by mouth daily with breakfast. 180 tablet 3   ondansetron (ZOFRAN) 8 MG tablet Take 1 tablet (8 mg total) by mouth every 8 (eight) hours as needed for nausea or vomiting. 20 tablet 0   rosuvastatin (CRESTOR) 20 MG tablet Take 1 tablet (20 mg total) by mouth daily. 30 tablet 11   spironolactone (ALDACTONE) 25 MG tablet Take 1 tablet (25 mg total) by mouth daily. 90 tablet 1   No current facility-administered medications for this visit.   Family History  Problem Relation Age of Onset   Hypertension Mother       Objective:  Physical Exam:  Vitals:   08/07/22 0824  BP:  112/67  Pulse: 73  Temp: 98.7 F (37.1 C)  TempSrc: Oral  SpO2: 98%  Weight: 198 lb 12.8 oz (90.2 kg)  Height: 5\' 8"  (1.727 m)    Constitutional: NAD, well groomed  HEENT: Right eye patch in place Cardiovascular: RRR, no m/r/g Pulmonary/Chest: Clear , normal effort Extremities: Warm, pedal pulses 1+ bilaterally, no ulceration or skin breakdown    Assessment & Plan:   Type 2 diabetes mellitus (HCC) Excellent control, hemoglobin A1c today is 5.7.  He has made multiple positive changes to his diet including smaller portions, cutting out sodas and sweets.  He is no longer taking insulin but still checks his blood sugar at home.  Does not have his meter with him today but reports fasting numbers between 90-120.  No episodes of hypoglycemia.  His lowest value that he can recall was 77.  We discussed continuing metformin versus decreasing to 1000 mg once daily.  He would like to try tapering down on metformin and continuing with his dietary changes.  Will plan to see him back in 3 months for repeat hemoglobin A1c. --Foot exam today was normal --Decrease metformin to 1000 mg daily --Follow-up in 3 months  Non-ischemic cardiomyopathy (HCC) Appears well compensated today, euvolemic.  He is currently taking  enalapril, spironolactone, and Lasix daily.  Denies swelling and shortness of breath.  Checking BMP.  Follows with cardiology.  Hyperlipidemia He does not believe he is taking his Crestor.  We are rechecking lipid panel today and advised he should likely be on a statin given his diabetes.  Will follow-up his results and plan to restart rosuvastatin 20 mg daily.  Essential hypertension Chronic and well-controlled on enalapril and spironolactone.  He is unable to tolerate beta-blockers due to bradycardia.  Checking BMP.

## 2022-08-07 NOTE — Assessment & Plan Note (Signed)
Chronic and well-controlled on enalapril and spironolactone.  He is unable to tolerate beta-blockers due to bradycardia.  Checking BMP.

## 2022-08-07 NOTE — Assessment & Plan Note (Signed)
Appears well compensated today, euvolemic.  He is currently taking enalapril, spironolactone, and Lasix daily.  Denies swelling and shortness of breath.  Checking BMP.  Follows with cardiology.

## 2022-08-07 NOTE — Patient Instructions (Addendum)
Phillip [name],  It was a pleasure to see you! For your diabetes, please decrease your metformin to 1,000 mg (2 tablets) once a day in the morning with breakfast. Keep up the great work with your diet. Follow up with me again in 3 months.   If you have any questions or concerns, call our clinic at [phone] or after hours call [phone] and ask for the internal medicine resident on call.   Thank you!  Dr. Astrid Drafts,  Fue un placer verte! Para su diabetes, reduzca su dosis de metformina a 1000 mg (2 tabletas) una vez al da por la maana con el desayuno. Contine con el gran trabajo con su dieta. Haz un seguimiento conmigo nuevamente en 3 meses.  Si tiene alguna pregunta o inquietud, llame a [name] al [phone] o fuera del horario de atencin llame al [phone] y pregunte por el residente de medicina interna de [name].  Gracias!  doctor g

## 2022-08-08 LAB — BMP8+ANION GAP
Anion Gap: 17 mmol/L (ref 10.0–18.0)
BUN/Creatinine Ratio: 23 — ABNORMAL HIGH (ref 9–20)
BUN: 24 mg/dL (ref 6–24)
CO2: 19 mmol/L — ABNORMAL LOW (ref 20–29)
Calcium: 9.5 mg/dL (ref 8.7–10.2)
Chloride: 103 mmol/L (ref 96–106)
Creatinine, Ser: 1.03 mg/dL (ref 0.76–1.27)
Glucose: 91 mg/dL (ref 70–99)
Potassium: 4.5 mmol/L (ref 3.5–5.2)
Sodium: 139 mmol/L (ref 134–144)
eGFR: 84 mL/min/{1.73_m2} (ref >59)

## 2022-08-08 LAB — LIPID PANEL
Chol/HDL Ratio: 4.8 ratio (ref 0.0–5.0)
Cholesterol, Total: 168 mg/dL (ref 100–199)
HDL: 35 mg/dL — ABNORMAL LOW (ref >39)
LDL Chol Calc (NIH): 95 mg/dL (ref 0–99)
Triglycerides: 225 mg/dL — ABNORMAL HIGH (ref 0–149)
VLDL Cholesterol Cal: 38 mg/dL (ref 5–40)

## 2022-09-02 ENCOUNTER — Encounter: Payer: Self-pay | Admitting: Internal Medicine

## 2022-09-03 ENCOUNTER — Other Ambulatory Visit: Payer: Self-pay | Admitting: *Deleted

## 2022-09-03 ENCOUNTER — Other Ambulatory Visit (HOSPITAL_COMMUNITY): Payer: Self-pay

## 2022-09-03 DIAGNOSIS — E1165 Type 2 diabetes mellitus with hyperglycemia: Secondary | ICD-10-CM

## 2022-09-03 MED ORDER — ONETOUCH VERIO VI STRP
ORAL_STRIP | 2 refills | Status: DC
Start: 2022-09-03 — End: 2023-01-07
  Filled 2022-09-03: qty 100, 25d supply, fill #0
  Filled 2022-10-14: qty 100, 25d supply, fill #1
  Filled 2022-11-26: qty 100, 25d supply, fill #2

## 2022-09-03 MED ORDER — METFORMIN HCL 500 MG PO TABS: 1000.0000 mg | ORAL_TABLET | Freq: Every day | ORAL | 3 refills | Status: DC

## 2022-09-03 MED ORDER — ONETOUCH DELICA PLUS LANCET33G MISC
Freq: Four times a day (QID) | 2 refills | Status: DC
  Filled 2022-09-03: qty 100, 25d supply, fill #0
  Filled 2022-11-27 – 2022-12-14 (×2): qty 100, 25d supply, fill #1

## 2022-09-03 NOTE — Addendum Note (Signed)
Addended by: Fredderick Severance on: 09/03/2022 01:11 PM   Modules accepted: Orders

## 2022-09-04 ENCOUNTER — Other Ambulatory Visit: Payer: Self-pay | Admitting: Internal Medicine

## 2022-09-04 ENCOUNTER — Other Ambulatory Visit (HOSPITAL_COMMUNITY): Payer: Self-pay

## 2022-09-04 DIAGNOSIS — E1165 Type 2 diabetes mellitus with hyperglycemia: Secondary | ICD-10-CM

## 2022-09-04 MED ORDER — METFORMIN HCL 500 MG PO TABS
1000.0000 mg | ORAL_TABLET | Freq: Every day | ORAL | 3 refills | Status: DC
Start: 2022-09-04 — End: 2022-09-05
  Filled 2022-09-04: qty 180, 90d supply, fill #0

## 2022-09-05 ENCOUNTER — Other Ambulatory Visit (HOSPITAL_COMMUNITY): Payer: Self-pay

## 2022-09-05 MED ORDER — METFORMIN HCL 500 MG PO TABS
1000.0000 mg | ORAL_TABLET | Freq: Every day | ORAL | 1 refills | Status: DC
Start: 2022-09-05 — End: 2023-03-05
  Filled 2022-09-05: qty 180, 90d supply, fill #0
  Filled 2022-12-12: qty 180, 90d supply, fill #1

## 2022-09-05 NOTE — Telephone Encounter (Signed)
Regular tablets are fine - we decreased his dose to 1,000 once a day because his Hgb A1c has improved and he would like to try coming off the medicine.

## 2022-09-16 ENCOUNTER — Other Ambulatory Visit (HOSPITAL_COMMUNITY): Payer: Self-pay

## 2022-09-16 ENCOUNTER — Other Ambulatory Visit: Payer: Self-pay | Admitting: Internal Medicine

## 2022-09-17 ENCOUNTER — Other Ambulatory Visit (HOSPITAL_COMMUNITY): Payer: Self-pay

## 2022-09-17 MED ORDER — FUROSEMIDE 40 MG PO TABS
40.0000 mg | ORAL_TABLET | Freq: Every day | ORAL | 1 refills | Status: DC | PRN
  Filled 2022-09-17: qty 90, 90d supply, fill #0
  Filled 2022-12-16: qty 90, 90d supply, fill #1

## 2022-09-19 ENCOUNTER — Other Ambulatory Visit (HOSPITAL_COMMUNITY): Payer: Self-pay

## 2022-09-19 ENCOUNTER — Encounter: Payer: Self-pay | Admitting: Internal Medicine

## 2022-09-20 ENCOUNTER — Other Ambulatory Visit: Payer: Self-pay | Admitting: Internal Medicine

## 2022-09-20 ENCOUNTER — Other Ambulatory Visit (HOSPITAL_COMMUNITY): Payer: Self-pay

## 2022-09-20 DIAGNOSIS — I10 Essential (primary) hypertension: Secondary | ICD-10-CM

## 2022-09-20 DIAGNOSIS — I428 Other cardiomyopathies: Secondary | ICD-10-CM

## 2022-09-20 MED ORDER — LISINOPRIL 40 MG PO TABS
40.0000 mg | ORAL_TABLET | Freq: Every day | ORAL | 3 refills | Status: DC
Start: 2022-09-20 — End: 2023-03-05
  Filled 2022-09-20: qty 90, 90d supply, fill #0
  Filled 2022-12-16: qty 90, 90d supply, fill #1

## 2022-10-03 ENCOUNTER — Ambulatory Visit: Payer: No Typology Code available for payment source

## 2022-10-03 VITALS — BP 109/66 | HR 80

## 2022-10-03 DIAGNOSIS — I10 Essential (primary) hypertension: Secondary | ICD-10-CM

## 2022-10-03 NOTE — Progress Notes (Signed)
    Phillip [name] presented today for blood pressure check. Patient is prescribed blood pressure medications and I confirmed that patient did take their blood pressure medication prior to today's appointment. Blood pressure was taken in the usual and appropriate manner using an automated BP cuff.     Vitals:   10/03/22 0900  BP: 109/66   Patient states he has not had any side effects after starting lisinopril.  Patient checks BP daily. He will bring log to his next OV with PCP on 11/06/22.  Results of today's visit will be routed to Dr. Antony Contras for review and further management.   [name], BSN, RN-BC

## 2022-10-14 ENCOUNTER — Encounter: Payer: Self-pay | Admitting: Internal Medicine

## 2022-10-15 ENCOUNTER — Other Ambulatory Visit (HOSPITAL_COMMUNITY): Payer: Self-pay

## 2022-11-06 ENCOUNTER — Other Ambulatory Visit: Payer: Self-pay

## 2022-11-06 ENCOUNTER — Encounter: Payer: Self-pay | Admitting: Internal Medicine

## 2022-11-06 ENCOUNTER — Ambulatory Visit: Payer: No Typology Code available for payment source | Admitting: Internal Medicine

## 2022-11-06 VITALS — BP 122/68 | HR 78 | Temp 98.6°F | Ht 68.0 in | Wt 206.7 lb

## 2022-11-06 DIAGNOSIS — Z7984 Long term (current) use of oral hypoglycemic drugs: Secondary | ICD-10-CM | POA: Diagnosis not present

## 2022-11-06 DIAGNOSIS — I110 Hypertensive heart disease with heart failure: Secondary | ICD-10-CM

## 2022-11-06 DIAGNOSIS — E1165 Type 2 diabetes mellitus with hyperglycemia: Secondary | ICD-10-CM | POA: Diagnosis not present

## 2022-11-06 DIAGNOSIS — I5022 Chronic systolic (congestive) heart failure: Secondary | ICD-10-CM

## 2022-11-06 DIAGNOSIS — I10 Essential (primary) hypertension: Secondary | ICD-10-CM

## 2022-11-06 LAB — GLUCOSE, CAPILLARY: Glucose-Capillary: 145 mg/dL — ABNORMAL HIGH (ref 70–99)

## 2022-11-06 LAB — POCT GLYCOSYLATED HEMOGLOBIN (HGB A1C): Hemoglobin A1C: 6.6 % — AB (ref 4.0–5.6)

## 2022-11-06 NOTE — Assessment & Plan Note (Signed)
Asymptomatic; well compensated on today's exam. Usually does not have an issue with volume. He has not been taking his lasix, was unaware that this was previously prescribed by cards. Reports some orthostatic symptoms; orthostatic vitals today were borderline. I encouraged PO intake. He is not sure if he is taking his spironolactone. He is going to [employer] me his medications when he gets home. Otherwise no changes.

## 2022-11-06 NOTE — Assessment & Plan Note (Signed)
Doing well on decreased dose of metformin. Hgb A1c has increased to 6.6 but his glucometer is showing 100% of readings within target range. Will continue current dose of metformin 1,000 mg daily for now.

## 2022-11-06 NOTE — Patient Instructions (Signed)
It was a pleasure to see you today! Please continue taking all of your medications as previously prescribed. Please make sure to stay hydrated especially when you are at work.   Follow up with me again in 6 months. If you have any questions or concerns, call our clinic at [phone] or after hours call [phone] and ask for the internal medicine resident on call.   Thank you!  Dr. Reece Agar

## 2022-11-06 NOTE — Assessment & Plan Note (Signed)
>>  ASSESSMENT AND PLAN FOR CHRONIC SYSTOLIC HEART FAILURE (HCC) WRITTEN ON 11/06/2022 12:50 PM BY GUILLOUD, CAROLYN, MD  Asymptomatic; well compensated on today's exam. Usually does not have an issue with volume. He has not been taking his lasix , was unaware that this was previously prescribed by cards. Reports some orthostatic symptoms; orthostatic vitals today were borderline. I encouraged PO intake. He is not sure if he is taking his spironolactone . He is going to [employer] me his medications when he gets home. Otherwise no changes.

## 2022-11-06 NOTE — Progress Notes (Signed)
Subjective:   Patient ID: Phillip [name] male   DOB: 09-Oct-1965 57 y.o.   MRN: [id number]  HPI: [name] is a 57 y.o. male with past medical history outlined below here for diabetes follow up after decreasing his metformin. Also BP f/u. Doing well, has his home BP log with him which looks excellent. Recently has been running in the 90s systolic. He reports symptoms of postural dizziness in the past week. No syncope. 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 He is doing well since decreasing his metformin to 1,000 mg once a day. Glucometer shows an average CBG of 117 (range 77-168), all within target range. No episodes of hypoglycemia.   Says he is only taking 3 medications. Metformin, lisinopril, and one other. Will send mychart message.   Past Medical History:  Diagnosis Date   Chronic back pain    "neck down" (07/20/2013)   Chronic systolic heart failure (HCC) 06/15/2014   Diabetes (HCC) 11/2021   GERD (gastroesophageal reflux disease)    High cholesterol    Hypertension    Renal calculus    Current Outpatient Medications  Medication Sig Dispense Refill   blood glucose meter kit and supplies KIT Use up to four times daily as directed. 1 each 0   furosemide (LASIX) 40 MG tablet Take 1 tablet (40 mg total) by mouth daily as needed. 90 tablet 1   glucose blood (ONETOUCH VERIO) test strip Use as directed up to four times daily 100 each 2   Insulin Pen Needle (PEN NEEDLES) 32G X 4 MM MISC Use as directed. 100 each 2   Lancets (ONETOUCH DELICA PLUS LANCET33G) MISC Use up to 4 (four) times daily as directed 100 each 2   Lancets 33G MISC Use as directed up to four times daily 100 each 0   lisinopril (ZESTRIL) 40 MG tablet Take 1 tablet (40 mg total) by mouth daily. 90 tablet 3   metFORMIN (GLUCOPHAGE) 500 MG tablet Take 2 tablets (1,000 mg total) by mouth daily with breakfast. 180 tablet 1   ondansetron (ZOFRAN) 8 MG tablet Take 1  tablet (8 mg total) by mouth every 8 (eight) hours as needed for nausea or vomiting. 20 tablet 0   rosuvastatin (CRESTOR) 20 MG tablet Take 1 tablet (20 mg total) by mouth daily. 30 tablet 11   spironolactone (ALDACTONE) 25 MG tablet Take 1 tablet (25 mg total) by mouth daily. 90 tablet 1   No current facility-administered medications for this visit.   Family History  Problem Relation Age of Onset   Hypertension Mother       Objective:  Physical Exam:  Vitals:   11/06/22 0900  BP: 122/68  Pulse: 78  Temp: 98.6 F (37 C)  TempSrc: Oral  SpO2: 97%  Weight: 206 lb 11.2 oz (93.8 kg)  Height: 5\' 8"  (1.727 m)    Constitutional: NAD, well groomed  HEENT: Right eye patch in place Cardiovascular: RRR, no m/r/g Pulmonary/Chest: Clear , normal effort   Assessment & Plan:   Type 2 diabetes mellitus (HCC) Doing well on decreased dose of metformin. Hgb A1c has increased to 6.6 but his glucometer is showing 100% of readings within target range. Will continue current dose of metformin 1,000 mg daily for now.   Chronic systolic heart failure (HCC) Asymptomatic; well compensated on today's exam. Usually does not have an issue with volume. He has not been taking his lasix, was unaware that this  was previously prescribed by cards. Reports some orthostatic symptoms; orthostatic vitals today were borderline. I encouraged PO intake. He is not sure if he is taking his spironolactone. He is going to [employer] me his medications when he gets home. Otherwise no changes.

## 2022-11-07 ENCOUNTER — Encounter: Payer: Self-pay | Admitting: Internal Medicine

## 2022-11-27 ENCOUNTER — Other Ambulatory Visit (HOSPITAL_COMMUNITY): Payer: Self-pay

## 2022-11-27 ENCOUNTER — Other Ambulatory Visit: Payer: Self-pay

## 2022-12-09 ENCOUNTER — Other Ambulatory Visit (HOSPITAL_COMMUNITY): Payer: Self-pay

## 2022-12-12 ENCOUNTER — Other Ambulatory Visit (HOSPITAL_COMMUNITY): Payer: Self-pay

## 2023-01-07 ENCOUNTER — Other Ambulatory Visit (HOSPITAL_COMMUNITY): Payer: Self-pay

## 2023-01-07 ENCOUNTER — Ambulatory Visit: Payer: No Typology Code available for payment source | Admitting: Student

## 2023-01-07 ENCOUNTER — Other Ambulatory Visit: Payer: Self-pay | Admitting: *Deleted

## 2023-01-07 ENCOUNTER — Encounter: Payer: Self-pay | Admitting: Student

## 2023-01-07 VITALS — BP 121/74 | HR 73 | Temp 98.0°F | Ht 68.0 in | Wt 205.5 lb

## 2023-01-07 DIAGNOSIS — G4700 Insomnia, unspecified: Secondary | ICD-10-CM

## 2023-01-07 DIAGNOSIS — F419 Anxiety disorder, unspecified: Secondary | ICD-10-CM

## 2023-01-07 DIAGNOSIS — E1165 Type 2 diabetes mellitus with hyperglycemia: Secondary | ICD-10-CM

## 2023-01-07 DIAGNOSIS — R5383 Other fatigue: Secondary | ICD-10-CM

## 2023-01-07 DIAGNOSIS — Z23 Encounter for immunization: Secondary | ICD-10-CM | POA: Diagnosis not present

## 2023-01-07 DIAGNOSIS — F32A Depression, unspecified: Secondary | ICD-10-CM

## 2023-01-07 MED ORDER — TRAZODONE HCL 50 MG PO TABS
50.0000 mg | ORAL_TABLET | Freq: Every evening | ORAL | 0 refills | Status: DC | PRN
Start: 2023-01-07 — End: 2023-03-05
  Filled 2023-01-07: qty 15, 15d supply, fill #0

## 2023-01-07 MED ORDER — ONETOUCH VERIO VI STRP
ORAL_STRIP | 2 refills | Status: DC
Start: 2023-01-07 — End: 2023-03-28
  Filled 2023-01-07: qty 100, 25d supply, fill #0
  Filled 2023-02-16: qty 100, 25d supply, fill #1

## 2023-01-07 MED ORDER — CITALOPRAM HYDROBROMIDE 20 MG PO TABS
20.0000 mg | ORAL_TABLET | Freq: Every day | ORAL | 2 refills | Status: DC
Start: 2023-01-07 — End: 2023-03-05
  Filled 2023-01-07: qty 30, 30d supply, fill #0

## 2023-01-07 NOTE — Progress Notes (Signed)
CC: Acute Concern of insomnia  HPI:  Phillip [name] is a 57 y.o. male with pertinent PMH of HTN, HFpEF, NICM, T2DM, anxiety/depression who presents to the clinic to discuss insomnia. Please see assessment and plan below for further details.  Past Medical History:  Diagnosis Date   Chronic back pain    "neck down" (07/20/2013)   Chronic systolic heart failure (HCC) 06/15/2014   Diabetes (HCC) 11/2021   GERD (gastroesophageal reflux disease)    High cholesterol    Hypertension    Renal calculus     Current Outpatient Medications  Medication Instructions   blood glucose meter kit and supplies KIT Use up to four times daily as directed.   citalopram (CELEXA) 20 mg, Oral, Daily   furosemide (LASIX) 40 mg, Oral, Daily PRN   glucose blood (ONETOUCH VERIO) test strip Use as directed up to four times daily   Insulin Pen Needle (PEN NEEDLES) 32G X 4 MM MISC Use as directed.   Lancets (ONETOUCH DELICA PLUS LANCET33G) MISC Use up to 4 (four) times daily as directed   Lancets 33G MISC Use as directed up to four times daily   lisinopril (ZESTRIL) 40 mg, Oral, Daily   metFORMIN (GLUCOPHAGE) 1,000 mg, Oral, Daily with breakfast   ondansetron (ZOFRAN) 8 mg, Oral, Every 8 hours PRN   rosuvastatin (CRESTOR) 20 mg, Oral, Daily   spironolactone (ALDACTONE) 25 mg, Oral, Daily   traZODone (DESYREL) 50 mg, Oral, At bedtime PRN     Review of Systems:   Pertinent items noted in HPI and/or A&P.  Physical Exam:  Vitals:   01/07/23 0918  BP: 121/74  Pulse: 73  Temp: 98 F (36.7 C)  TempSrc: Oral  SpO2: 98%  Weight: 205 lb 8 oz (93.2 kg)  Height: 5\' 8"  (1.727 m)    Constitutional: Well-appearing adult male. In no acute distress. HEENT: Normocephalic, atraumatic, Sclera non-icteric, PERRL, EOM intact Cardio:Regular rate and rhythm. 2+ bilateral radial pulses. Pulm:Clear to auscultation bilaterally. Normal work of breathing on room air. Abdomen: Soft, non-tender, non-distended,  positive bowel sounds. BJY:NWGNFAOZ for extremity edema. Skin:Warm and dry. Neuro:Alert and oriented x3. No focal deficit noted. Psych:Pleasant mood and affect.   Assessment & Plan:   Insomnia Presents with acute concerns of insomnia for the past 3 days.  His usual sleep schedule is falling asleep at 11 PM and waking up at 4 AM for work at least 5 days a week.  Usually gets in bed around 9-9 30 and avoid screens and does not have a TV in his bed.  He does have racing thoughts at night which keep him up.  He is usually sleeping during the day has been told that he snores in the past and his STOP-BANG score is at least 4.  He does not have any restless leg symptoms.  On nonwork nights in vacation he wakes up later around 7 AM and feels better on those days.  He has not had any recent stressors, changes in meds or other causes of his acute worsening insomnia.  He has not had stretches like this before and does not have any past mania.  He was previously recommended to start an SSRI due to depression with his PHQ-9 score of 11.  He did not want to get hooked on an SSRI.  I believe his acute insomnia is likely due to his untreated mood disorder and I also think he has baseline sleep apnea and has not had a sleep test before.  We discussed SSRI medication and its indications for his mood disorder as well as short acting medication like trazodone for his sleep issues and he understands the risks and benefits of these medications. - Start citalopram 10 mg daily for the first week and then increase to 20 mg daily after this - Trazodone 50 mg nightly as needed for acute sleep issues - Referral for sleep study    Patient discussed with Dr. Julieanne Cotton, DO [facility] Internal [facility] Resident PGY-2 Clinic Phone: [phone] Pager: [phone]

## 2023-01-07 NOTE — Patient Instructions (Addendum)
Gracias, [name], por permitirnos brindarle atencin mdica hoy. Creo que su trastorno del sueo se debe en parte a su ansiedad y depresin no tratadas. Creo que se beneficiara de un medicamento como el citalopram, del que hemos hablado en el pasado. Este medicamento no es [place] y los efectos secundarios ms comunes son disfuncin sexual y Dentist gastrointestinal. Comenzaremos con una dosis baja de 10 mg al da durante la primera semana y luego aumentaremos a 20 mg al da despus de esto. Tambin puedo recetarle un medicamento segn sea necesario durante un perodo corto de tiempo para ayudarlo a dormir mientras el citalopram comienza a Research scientist (life sciences). Este medicamento es trazodona y puede tomar 50 mg antes de acostarse para ayudarlo a dormir. Tambin me gustara que se hiciera un estudio del sueo para evaluar la apnea del sueo y le enviar una derivacin a la clnica de medicina del sueo para programarle una cita.  Seguimiento: 2-3 meses  Recuerde:  Si tiene alguna pregunta o inquietud, llame a la clnica de medicina interna al [phone].  Rocky Morel, DO [facility]   Thank you, Phillip [name], for allowing [place] to provide your care today.  I think your sleep disturbance is partially caused by your untreated anxiety and depression.  I think you would benefit from a medication like citalopram that we have discussed in the past.  This medication is nonaddictive and the most common side effects are sexual dysfunction and gastrointestinal upset.  We will start at a low dose of 10 mg a day for the first week and then increase to 20 mg daily after this.  I can also prescribe an as needed medication for a short period of time to help you sleep while the citalopram starts to work.  This medication is trazodone and you can take 50 mg at bedtime to help you sleep.  I would also like you to get a sleep study to evaluate for sleep apnea and I will send a  referral to the sleep medicine clinic to get you scheduled for this.      Follow up: 2-3 months    Remember:     Should you have any questions or concerns please call the internal medicine clinic at [phone].     Rocky Morel, DO [facility]

## 2023-01-07 NOTE — Telephone Encounter (Signed)
He saw Dr Geraldo Pitter this morning; forgot to tell the doctor he needs a refill on Verio Test strips.

## 2023-01-10 NOTE — Assessment & Plan Note (Addendum)
Presents with acute concerns of insomnia for the past 3 days.  His usual sleep schedule is falling asleep at 11 PM and waking up at 4 AM for work at least 5 days a week.  Usually gets in bed around 9-9 30 and avoid screens and does not have a TV in his bed.  He does have racing thoughts at night which keep him up.  He is usually sleeping during the day has been told that he snores in the past and his STOP-BANG score is at least 4.  He does not have any restless leg symptoms.  On nonwork nights in vacation he wakes up later around 7 AM and feels better on those days.  He has not had any recent stressors, changes in meds or other causes of his acute worsening insomnia.  He has not had stretches like this before and does not have any past mania.  He was previously recommended to start an SSRI due to depression with his PHQ-9 score of 11.  He did not want to get hooked on an SSRI.  I believe his acute insomnia is likely due to his untreated mood disorder and I also think he has baseline sleep apnea and has not had a sleep test before.  We discussed SSRI medication and its indications for his mood disorder as well as short acting medication like trazodone for his sleep issues and he understands the risks and benefits of these medications. - Start citalopram 10 mg daily for the first week and then increase to 20 mg daily after this - Trazodone 50 mg nightly as needed for acute sleep issues - Referral for sleep study

## 2023-01-13 NOTE — Progress Notes (Signed)
 Internal Medicine Clinic Attending  Case discussed with the resident physician at the time of the visit.  We reviewed the patient's history, exam, and pertinent patient test results.  I agree with the assessment, diagnosis, and plan of care documented in the resident's note.

## 2023-01-27 ENCOUNTER — Other Ambulatory Visit: Payer: Self-pay

## 2023-01-27 ENCOUNTER — Other Ambulatory Visit (HOSPITAL_COMMUNITY): Payer: Self-pay

## 2023-01-27 DIAGNOSIS — I10 Essential (primary) hypertension: Secondary | ICD-10-CM

## 2023-01-27 DIAGNOSIS — I5022 Chronic systolic (congestive) heart failure: Secondary | ICD-10-CM

## 2023-01-27 MED ORDER — SPIRONOLACTONE 25 MG PO TABS
25.0000 mg | ORAL_TABLET | Freq: Every day | ORAL | 1 refills | Status: DC
Start: 2023-01-27 — End: 2023-03-05
  Filled 2023-01-27: qty 90, 90d supply, fill #0

## 2023-02-18 ENCOUNTER — Other Ambulatory Visit (HOSPITAL_COMMUNITY): Payer: Self-pay

## 2023-03-05 ENCOUNTER — Encounter: Payer: Self-pay | Admitting: Internal Medicine

## 2023-03-05 ENCOUNTER — Ambulatory Visit: Payer: No Typology Code available for payment source | Admitting: Internal Medicine

## 2023-03-05 ENCOUNTER — Other Ambulatory Visit (HOSPITAL_COMMUNITY): Payer: Self-pay

## 2023-03-05 VITALS — BP 98/67 | HR 84 | Temp 98.6°F | Ht 68.0 in | Wt 196.9 lb

## 2023-03-05 DIAGNOSIS — E119 Type 2 diabetes mellitus without complications: Secondary | ICD-10-CM

## 2023-03-05 DIAGNOSIS — E1165 Type 2 diabetes mellitus with hyperglycemia: Secondary | ICD-10-CM

## 2023-03-05 DIAGNOSIS — F419 Anxiety disorder, unspecified: Secondary | ICD-10-CM

## 2023-03-05 DIAGNOSIS — I428 Other cardiomyopathies: Secondary | ICD-10-CM

## 2023-03-05 DIAGNOSIS — F32A Depression, unspecified: Secondary | ICD-10-CM

## 2023-03-05 DIAGNOSIS — E785 Hyperlipidemia, unspecified: Secondary | ICD-10-CM | POA: Diagnosis not present

## 2023-03-05 DIAGNOSIS — Z7984 Long term (current) use of oral hypoglycemic drugs: Secondary | ICD-10-CM

## 2023-03-05 DIAGNOSIS — I5022 Chronic systolic (congestive) heart failure: Secondary | ICD-10-CM

## 2023-03-05 LAB — POCT GLYCOSYLATED HEMOGLOBIN (HGB A1C): Hemoglobin A1C: 6.1 % — AB (ref 4.0–5.6)

## 2023-03-05 LAB — GLUCOSE, CAPILLARY: Glucose-Capillary: 117 mg/dL — ABNORMAL HIGH (ref 70–99)

## 2023-03-05 MED ORDER — ROSUVASTATIN CALCIUM 20 MG PO TABS
20.0000 mg | ORAL_TABLET | Freq: Every day | ORAL | 3 refills | Status: DC
  Filled 2023-03-05: qty 90, 90d supply, fill #0

## 2023-03-05 NOTE — Assessment & Plan Note (Addendum)
Has a history of dilated cardiomyopathy with recovered EF. Seen by cardiology in the past but but has never had an ischemic evaluation of any kind (was uninsured for many years). On most recent echo EF improved to 60-65%. He has no symptoms of CHF, euvolemic today on exam. Does not require PRN lasix and does not think he has this medication anymore. He is spanish speaking and did not bring his pills bottles today. Reports he is not taking lisinopril, thought it had been discontinued. Has been taking spironolactone 25 mg daily and is complaining of postural dizziness. BP today is low at 98/67, consistent with his home readings.  -- Check BMP today; discontinue spironolactone  -- Continue home daily BP checks and follow up in 4-6 weeks.  -- Consider entresto or restarting lower dose lisinopril for GDMT if BP will allow.

## 2023-03-05 NOTE — Assessment & Plan Note (Signed)
Previously prescribed crestor for primary prevention with history of type II diabetes.  - Has not been taking this. Rechecking lipid panel today, refilled crestor 20 mg daily. Goal LDL < 100 will repeat at next OV.

## 2023-03-05 NOTE — Patient Instructions (Addendum)
[name],  Fue un placer verte. Empiece a tomar crestor Building control surveyor al da para Public house manager. De lo contrario, deje de Engineer, maintenance (IT). Lo mantendremos sin metformina y veremos cmo le va.   Por favor, vuelva a realizar un seguimiento conmigo en 4 a 6 semanas.   Si tiene alguna pregunta o inquietud, llame a [name] al [phone] o fuera del horario de atencin llame al [phone] y pregunte por el residente de medicina interna de [name].   Gracias!  doctor g  --------------------------------------------------------------------------------------  [name],  It was a pleasure to see you. Please start taking crestor once a day for your cholesterol. Otherwise, please stop taking spironolactone. We will keep you off metformin and see how you do.   Please follow up with me again in 4-6 weeks.   If you have any questions or concerns, call our clinic at [phone] or after hours call [phone] and ask for the internal medicine resident on call.   Thank you!  Dr. Reece Agar

## 2023-03-05 NOTE — Assessment & Plan Note (Signed)
Denies symptoms of anxiety and depression. Mood and sleep have been good, not taking celexa or trazodone anymore. I have removed these from his medication list.

## 2023-03-05 NOTE — Progress Notes (Signed)
Subjective:   Patient ID: Phillip [name] male   DOB: Aug 06, 1965 57 y.o.   MRN: [id number]  HPI: [name] is a 57 y.o. male with past medical history outlined below here for diabetes follow up. For further details of today's visit, please refer to the assessment and plan below.   Past Medical History:  Diagnosis Date   Chronic back pain    "neck down" (07/20/2013)   Chronic systolic heart failure (HCC) 06/15/2014   Diabetes (HCC) 11/2021   GERD (gastroesophageal reflux disease)    High cholesterol    Hypertension    Renal calculus    Current Outpatient Medications  Medication Sig Dispense Refill   blood glucose meter kit and supplies KIT Use up to four times daily as directed. 1 each 0   furosemide (LASIX) 40 MG tablet Take 1 tablet (40 mg total) by mouth daily as needed. 90 tablet 1   glucose blood (ONETOUCH VERIO) test strip Use as directed up to four times daily 100 each 2   Insulin Pen Needle (PEN NEEDLES) 32G X 4 MM MISC Use as directed. 100 each 2   Lancets (ONETOUCH DELICA PLUS LANCET33G) MISC Use up to 4 (four) times daily as directed 100 each 2   Lancets 33G MISC Use as directed up to four times daily 100 each 0   rosuvastatin (CRESTOR) 20 MG tablet Take 1 tablet (20 mg total) by mouth daily. 90 tablet 3   No current facility-administered medications for this visit.   Family History  Problem Relation Age of Onset   Hypertension Mother     Objective:  Physical Exam:  Vitals:   03/05/23 0821  BP: 98/67  Pulse: 84  Temp: 98.6 F (37 C)  TempSrc: Oral  SpO2: 97%  Weight: 196 lb 14.4 oz (89.3 kg)  Height: 5\' 8"  (1.727 m)    Constitutional: NAD, well groomed  HEENT: Right eye patch in place Cardiovascular: Distant heart sounds, but RRR  Pulmonary/Chest: Clear , normal effort Extremities: warm, no edema   Assessment & Plan:   Type 2 diabetes mellitus (HCC) Type II Diabetes without complication; chronic and well controlled.  -  Previously on metformin 1000 mg daily. Lost his prescription about 3 weeks ago and hasn't taken it.  - Glucometer --> average CBG of 126, in target range 93% of the time with no episodes of hypoglycemia. Checking on average 2 times a day.  - Hgb A1c today is stable at 6.1 - Plan to continue off metformin with daily CBG checks for now. Follow up 4-6 weeks.  - Checking urine microalbumin and BMP. Not currently on ACEi / ARB / or SLGT-2 inhibitor.    Hyperlipidemia Previously prescribed crestor for primary prevention with history of type II diabetes.  - Has not been taking this. Rechecking lipid panel today, refilled crestor 20 mg daily. Goal LDL < 100 will repeat at next OV.   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 Seen by cardiology in the past but but has never had an ischemic evaluation of any kind (was uninsured for many years). On most recent echo EF improved to 60-65%. He has no symptoms of CHF, euvolemic today on exam. Does not require PRN lasix and does not think he has this medication anymore. He is spanish speaking and did not bring his pills bottles today. Reports he is not taking lisinopril, thought it had been discontinued. Has been taking spironolactone 25 mg daily and is  complaining of postural dizziness. BP today is low at 98/67, consistent with his home readings.  -- Check BMP today; discontinue spironolactone  -- Continue home daily BP checks and follow up in 4-6 weeks.  -- Consider entresto or restarting lower dose lisinopril for GDMT if BP will allow.    Anxiety and depression Denies symptoms of anxiety and depression. Mood and sleep have been good, not taking celexa or trazodone anymore. I have removed these from his medication list.

## 2023-03-05 NOTE — Assessment & Plan Note (Signed)
Type II Diabetes without complication; chronic and well controlled.  - Previously on metformin 1000 mg daily. Lost his prescription about 3 weeks ago and hasn't taken it.  - Glucometer --> average CBG of 126, in target range 93% of the time with no episodes of hypoglycemia. Checking on average 2 times a day.  - Hgb A1c today is stable at 6.1 - Plan to continue off metformin with daily CBG checks for now. Follow up 4-6 weeks.  - Checking urine microalbumin and BMP. Not currently on ACEi / ARB / or SLGT-2 inhibitor.

## 2023-03-06 LAB — MICROALBUMIN / CREATININE URINE RATIO
Creatinine, Urine: 63.3 mg/dL
Microalb/Creat Ratio: <5 mg/g{creat} (ref 0–29)
Microalbumin, Urine: <3 ug/mL

## 2023-03-07 LAB — BMP8+ANION GAP
Anion Gap: 17 mmol/L (ref 10.0–18.0)
BUN/Creatinine Ratio: 25 — ABNORMAL HIGH (ref 9–20)
BUN: 28 mg/dL — ABNORMAL HIGH (ref 6–24)
CO2: 19 mmol/L — ABNORMAL LOW (ref 20–29)
Calcium: 9.8 mg/dL (ref 8.7–10.2)
Chloride: 102 mmol/L (ref 96–106)
Creatinine, Ser: 1.12 mg/dL (ref 0.76–1.27)
Glucose: 108 mg/dL — ABNORMAL HIGH (ref 70–99)
Potassium: 4.9 mmol/L (ref 3.5–5.2)
Sodium: 138 mmol/L (ref 134–144)
eGFR: 76 mL/min/{1.73_m2} (ref >59)

## 2023-03-07 LAB — LIPID PANEL
Chol/HDL Ratio: 6.2 {ratio} — ABNORMAL HIGH (ref 0.0–5.0)
Cholesterol, Total: 204 mg/dL — ABNORMAL HIGH (ref 100–199)
HDL: 33 mg/dL — ABNORMAL LOW (ref >39)
LDL Chol Calc (NIH): 118 mg/dL — ABNORMAL HIGH (ref 0–99)
Triglycerides: 302 mg/dL — ABNORMAL HIGH (ref 0–149)
VLDL Cholesterol Cal: 53 mg/dL — ABNORMAL HIGH (ref 5–40)

## 2023-03-12 ENCOUNTER — Encounter: Payer: No Typology Code available for payment source | Admitting: Internal Medicine

## 2023-03-22 ENCOUNTER — Other Ambulatory Visit: Payer: Self-pay | Admitting: Student in an Organized Health Care Education/Training Program

## 2023-03-24 ENCOUNTER — Other Ambulatory Visit: Payer: Self-pay | Admitting: Internal Medicine

## 2023-03-24 ENCOUNTER — Other Ambulatory Visit (HOSPITAL_COMMUNITY): Payer: Self-pay

## 2023-03-24 ENCOUNTER — Telehealth: Payer: Self-pay | Admitting: *Deleted

## 2023-03-24 ENCOUNTER — Other Ambulatory Visit: Payer: Self-pay | Admitting: Student in an Organized Health Care Education/Training Program

## 2023-03-24 DIAGNOSIS — I10 Essential (primary) hypertension: Secondary | ICD-10-CM

## 2023-03-24 MED ORDER — FUROSEMIDE 40 MG PO TABS
40.0000 mg | ORAL_TABLET | Freq: Every day | ORAL | 1 refills | Status: DC | PRN
  Filled 2023-03-24: qty 90, 90d supply, fill #0

## 2023-03-24 MED ORDER — FUROSEMIDE 40 MG PO TABS
40.0000 mg | ORAL_TABLET | Freq: Every day | ORAL | 3 refills | Status: DC | PRN
  Filled 2023-03-24: qty 90, 90d supply, fill #0

## 2023-03-24 MED ORDER — LISINOPRIL 5 MG PO TABS
5.0000 mg | ORAL_TABLET | Freq: Every day | ORAL | 3 refills | Status: DC
  Filled 2023-03-24: qty 90, 90d supply, fill #0

## 2023-03-24 NOTE — Addendum Note (Signed)
Addended by: Cala Bradford on: 03/24/2023 02:09 PM   Modules accepted: Orders

## 2023-03-24 NOTE — Telephone Encounter (Signed)
This medicine was also discontinued at his last visit due to low blood pressure. Please have him schedule follow up with one of the residents. Thanks.

## 2023-03-24 NOTE — Telephone Encounter (Signed)
Patient walked in asking for refills in his Lisinopril and Lasix.  Patient stated that he has been taking both everyday and was told to come to the doctor by Pharmacy.  Spoke to patient through interpreters from [facility] [place]  [id number], [place] [id number] and [place] [id number].  B/P was done on patient per 111/72 P-78 in left arm sitting.  Patient took medications this morning.   Results to Dr. Lafonda Mosses patient medications to be refilled at a lower dosage.  Patient was advised to schedule a 6 week return appointment  and to stop the Lasix if he becomes dehydrated and gets excessive thirst or feels dizziness.  To call if theses problems arise.  Patient voiced understanding through the interpreter.

## 2023-03-24 NOTE — Telephone Encounter (Signed)
I did

## 2023-03-24 NOTE — Telephone Encounter (Signed)
Rx has been cancelled, I called the patient unable to reach him, I lvm for him to give [place] a call back regarding the refill request.

## 2023-03-24 NOTE — Telephone Encounter (Signed)
Declined lasix refill - was not taking at his last visit. If something has changed or he needs to be re evaluated please ask him to schedule and acute visit. Thanks.

## 2023-03-24 NOTE — Telephone Encounter (Signed)
Medication sent to pharmacy  

## 2023-03-24 NOTE — Progress Notes (Signed)
Patient walked into clinic asking for refills of his lisinopril 40mg  daily and his lasix 40mg  daily prn swelling.  He has been taking both daily.  At last visit with Dr. Reece Agar, his BP was 90s/60s, so she stopped his spironolactone. At that time, the patient incorrectly thought that he was not taking an ACE inhibitor.  Today, BP 111/70 after taking the lisinopril and lasix earlier today.   Plan: - Send in lower dose of Lisinopril (5mg  daily) to his pharmacy - Refill lasix 40mg  PO daily prn leg swelling. RN instructed patient on how to take this. I put instructions in spanish on the label  Follow up in 1 month for BP check

## 2023-03-25 ENCOUNTER — Encounter: Payer: Self-pay | Admitting: Internal Medicine

## 2023-03-28 ENCOUNTER — Telehealth: Payer: Self-pay | Admitting: *Deleted

## 2023-03-28 ENCOUNTER — Other Ambulatory Visit (HOSPITAL_COMMUNITY): Payer: Self-pay

## 2023-03-28 DIAGNOSIS — E1165 Type 2 diabetes mellitus with hyperglycemia: Secondary | ICD-10-CM

## 2023-03-28 MED ORDER — ONETOUCH VERIO VI STRP
ORAL_STRIP | Freq: Four times a day (QID) | 2 refills | Status: DC
  Filled 2023-03-28 – 2023-08-25 (×2): qty 100, 25d supply, fill #0

## 2023-03-28 NOTE — Telephone Encounter (Signed)
 Return call to patient to answer E Chart message.  Used [employer] [phone] to call patient for interpretation of message.  Patient was called message left that the Clinics had returned his call.  Read message to Jennine who interpreted that patient needs refill on his test strips only has 4 to 5 days left.  Patient also stated did not understand medication given at last appointment on 12/30.When he came to request refills on his medications.  Spoke with Dr. Guilloud who will refill the Test Strips and requests that patient make  an appointment to come in discuss meds.  RTC to patient through [facility] [phone].  Message was left that doctor will do refill and patient needs to schedule an appointment to be seen in the Clinics to discuss his medications.

## 2023-04-07 ENCOUNTER — Other Ambulatory Visit (HOSPITAL_COMMUNITY): Payer: Self-pay

## 2023-05-05 ENCOUNTER — Ambulatory Visit (INDEPENDENT_AMBULATORY_CARE_PROVIDER_SITE_OTHER): Payer: BLUE CROSS/BLUE SHIELD | Admitting: Student

## 2023-05-05 VITALS — BP 137/87 | HR 72 | Temp 98.6°F | Wt 206.8 lb

## 2023-05-05 DIAGNOSIS — E785 Hyperlipidemia, unspecified: Secondary | ICD-10-CM

## 2023-05-05 DIAGNOSIS — I428 Other cardiomyopathies: Secondary | ICD-10-CM | POA: Diagnosis not present

## 2023-05-05 DIAGNOSIS — E119 Type 2 diabetes mellitus without complications: Secondary | ICD-10-CM

## 2023-05-05 NOTE — Assessment & Plan Note (Signed)
 Has T2DM but dilated cardiomyopathy (non-ischemic). Lipid panel deferred until next visit when lab work will be due.   -cw Crestor  20mg  daily

## 2023-05-05 NOTE — Progress Notes (Signed)
 CC:   HTN follow up    HPI:  Phillip [name] is a 58 y.o. male living with a history stated below and presents today for evaluation of his BP. Please see problem based assessment and plan for additional details.  Past Medical History:  Diagnosis Date   Chronic back pain    "neck down" (07/20/2013)   Chronic systolic heart failure (HCC) 06/15/2014   Diabetes (HCC) 11/2021   GERD (gastroesophageal reflux disease)    High cholesterol    Hypertension    Renal calculus     Current Outpatient Medications on File Prior to Visit  Medication Sig Dispense Refill   lisinopril  (ZESTRIL ) 10 MG tablet Take 10 mg by mouth daily.     blood glucose meter kit and supplies KIT Use up to four times daily as directed. 1 each 0   furosemide  (LASIX ) 40 MG tablet Take 1 tablet (40 mg total) by mouth daily as needed for edema. Take as needed if legs are swollen. 90 tablet 3   glucose blood (ONETOUCH VERIO) test strip Use as directed up to four times daily 100 each 2   Insulin  Pen Needle (PEN NEEDLES) 32G X 4 MM MISC Use as directed. 100 each 2   Lancets (ONETOUCH DELICA PLUS LANCET33G) MISC Use up to 4 (four) times daily as directed 100 each 2   Lancets 33G MISC Use as directed up to four times daily 100 each 0   rosuvastatin  (CRESTOR ) 20 MG tablet Take 1 tablet (20 mg total) by mouth daily. 90 tablet 3   [DISCONTINUED] Insulin  Glargine (BASAGLAR  KWIKPEN) 100 UNIT/ML Inject 10 Units into the skin daily. 30 mL 2   No current facility-administered medications on file prior to visit.    Family History  Problem Relation Age of Onset   Hypertension Mother     Review of Systems: ROS negative except for what is noted on the assessment and plan.  Vitals:   05/05/23 1534 05/05/23 1559  BP: (!) 142/90 137/87  Pulse: 74 72  Temp: 98.6 F (37 C)   TempSrc: Oral   SpO2: 98%   Weight: 206 lb 12.8 oz (93.8 kg)     Physical Exam: Constitutional: well-appearing in NAD HENT: normocephalic  atraumatic, mucous membranes moist Eyes: conjunctiva non-erythematous Cardiovascular: regular rate and rhythm, no m/r/g no BLE edema Pulmonary/Chest: normal work of breathing on room air, lungs clear to auscultation bilaterally Abdominal: soft, non-tender, protuberant, no fluid wave MSK: normal bulk and tone Neurological: alert & oriented x 3, no focal deficit Skin: warm and dry Psych: normal mood and behavior  Assessment & Plan:   Patient discussed with Dr. Broadus Canes  Non-ischemic cardiomyopathy [facility]) Has HTN and dilated cardiomyopathy. His blood pressure today is 137/87 on repeat measurement. Spironolactone  was discontinued due to soft Bps. Is taking furosemide  and lisinopril . Is on lisinopril  10mg  daily. He saw that his BP was increasing and restarted on his own. Notes that he never gets bilateral LE edema but instead he will note an increased abdominal girth. Has been having some mild SOB, and uses 3 pillows at night. No change from his usual.  Has been weighing himself at home and ranges around 190-200lbs. Instructed that if he gains 3lbs in one day or 5lbs in one week to call the office should he need to increase the dose of lasix . He is currently taking Lasix  40mg  daily. I think he is euvolemic and will keep at this dose today. Cr function measured in December,  WNL. His BP is elevated although he reports that BP will stay around the 120s at home. Will continue at this dose and he is to notify us  if his blood pressure goes over 130/80.   -Lisinopril  10mg  daily  -Lasix  40mg  daily   Diabetes mellitus without complication, without long-term current use of insulin  (HCC) Has history of T2DM and was on metformin  1000mg  daily. He stopped taking metformin  as he had lost his prescription. Came for a follow up and was being monitored as he was in target range 93% of the time. His A1C was 6.1. States that his glucose has been higher but also endorses that he has been eating more than usual. No N/V.  Would like to go back to metformin  today. Feels like he will be able to control his eating. On review of his CGM values, he has had highs around the 250s. Still not three months since A1C was measured. Joint decision making to improve lifestyle habits, eating foods that are less processed and have a low glycemic index. Also increasing exercise. He opts this over re-starting metformin  at this time and re-measure A1C in 4 weeks.   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  Hyperlipidemia Has T2DM but dilated cardiomyopathy (non-ischemic). Lipid panel deferred until next visit when lab work will be due.   -cw Crestor  20mg  daily   Tracer Ngo, MD [facility] Internal Medicine, PGY-1 Phone: [phone] Date 05/05/2023 Time 4:39 PM

## 2023-05-05 NOTE — Patient Instructions (Signed)
 Gracias, [name] por permitirnos brindarle atencin hoy.   Referencias ordenadas hoy:  Referral Orders         Ambulatory referral to Ophthalmology       Recuerde:   Fue un gusto atenderlo hoy.   Para su corazon e hipertension tome:   -Lisinopril  10mg  diarios  -Furosemida 40mg  diarios  -Mida su peso cada dia. Si sube mas de 3 lbs en un dia o 5lbs en una semana, llamenos para que ajustemos su dosis de furosemida.    Para su glucosa:  Coma mas sano, comidas de indice glicemico bajo.  Haga ejercicio por minimo a la semana.  Vaya al ophthalmologo (doctor del ojo) para que le haga chequeo de su retina  Vuelva para su control en 4 semanas.    Si tiene alguna pregunta o inquietud, llame a la clnica de medicina interna al [phone].     Kyvon Ngo, MD [facility]

## 2023-05-05 NOTE — Assessment & Plan Note (Signed)
 Has HTN and dilated cardiomyopathy. His blood pressure today is 137/87 on repeat measurement. Spironolactone  was discontinued due to soft Bps. Is taking furosemide  and lisinopril . Is on lisinopril  10mg  daily. He saw that his BP was increasing and restarted on his own. Notes that he never gets bilateral LE edema but instead he will note an increased abdominal girth. Has been having some mild SOB, and uses 3 pillows at night. No change from his usual.  Has been weighing himself at home and ranges around 190-200lbs. Instructed that if he gains 3lbs in one day or 5lbs in one week to call the office should he need to increase the dose of lasix . He is currently taking Lasix  40mg  daily. I think he is euvolemic and will keep at this dose today. Cr function measured in December, WNL. His BP is elevated although he reports that BP will stay around the 120s at home. Will continue at this dose and he is to notify us  if his blood pressure goes over 130/80.   -Lisinopril  10mg  daily  -Lasix  40mg  daily

## 2023-05-05 NOTE — Assessment & Plan Note (Addendum)
 Has history of T2DM and was on metformin  1000mg  daily. He stopped taking metformin  as he had lost his prescription. Came for a follow up and was being monitored as he was in target range 93% of the time. His A1C was 6.1. States that his glucose has been higher but also endorses that he has been eating more than usual. No N/V. Would like to go back to metformin  today. Feels like he will be able to control his eating. On review of his CGM values, he has had highs around the 250s. Still not three months since A1C was measured. Joint decision making to improve lifestyle habits, eating foods that are less processed and have a low glycemic index. Also increasing exercise. He opts this over re-starting metformin  at this time and re-measure A1C in 4 weeks.   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

## 2023-05-06 NOTE — Progress Notes (Signed)
Internal Medicine Clinic Attending  Case discussed with the resident at the time of the visit.  We reviewed the resident's history and exam and pertinent patient test results.  I agree with the assessment, diagnosis, and plan of care documented in the resident's note.

## 2023-05-15 ENCOUNTER — Other Ambulatory Visit: Payer: Self-pay | Admitting: Internal Medicine

## 2023-05-15 ENCOUNTER — Other Ambulatory Visit (HOSPITAL_COMMUNITY): Payer: Self-pay

## 2023-05-15 MED ORDER — LISINOPRIL 10 MG PO TABS
10.0000 mg | ORAL_TABLET | Freq: Every day | ORAL | 3 refills | Status: DC
  Filled 2023-05-15: qty 90, 90d supply, fill #0
  Filled 2023-08-25: qty 90, 90d supply, fill #1
  Filled 2023-11-12: qty 90, 90d supply, fill #2

## 2023-05-16 ENCOUNTER — Other Ambulatory Visit (HOSPITAL_COMMUNITY): Payer: Self-pay

## 2023-07-16 ENCOUNTER — Emergency Department (HOSPITAL_COMMUNITY)
Admission: EM | Admit: 2023-07-16 | Discharge: 2023-07-16 | Disposition: A | Discharge status: Home / Self Care | Attending: Emergency Medicine | Admitting: Emergency Medicine

## 2023-07-16 ENCOUNTER — Ambulatory Visit: Admitting: Student

## 2023-07-16 ENCOUNTER — Other Ambulatory Visit (HOSPITAL_COMMUNITY): Payer: Self-pay

## 2023-07-16 ENCOUNTER — Other Ambulatory Visit: Payer: Self-pay

## 2023-07-16 ENCOUNTER — Emergency Department (HOSPITAL_COMMUNITY)

## 2023-07-16 ENCOUNTER — Encounter (HOSPITAL_COMMUNITY): Payer: Self-pay

## 2023-07-16 VITALS — BP 139/67 | HR 60 | Temp 98.1°F | Ht 67.0 in | Wt 212.0 lb

## 2023-07-16 DIAGNOSIS — M25512 Pain in left shoulder: Secondary | ICD-10-CM | POA: Insufficient documentation

## 2023-07-16 DIAGNOSIS — R0789 Other chest pain: Secondary | ICD-10-CM | POA: Diagnosis not present

## 2023-07-16 DIAGNOSIS — M79602 Pain in left arm: Secondary | ICD-10-CM

## 2023-07-16 DIAGNOSIS — R079 Chest pain, unspecified: Secondary | ICD-10-CM

## 2023-07-16 LAB — BASIC METABOLIC PANEL WITH GFR
Anion gap: 8 (ref 5–15)
BUN: 19 mg/dL (ref 6–20)
CO2: 25 mmol/L (ref 22–32)
Calcium: 9.1 mg/dL (ref 8.9–10.3)
Chloride: 104 mmol/L (ref 98–111)
Creatinine, Ser: 0.82 mg/dL (ref 0.61–1.24)
GFR, Estimated: >60 mL/min (ref >60)
Glucose, Bld: 154 mg/dL — ABNORMAL HIGH (ref 70–99)
Potassium: 4 mmol/L (ref 3.5–5.1)
Sodium: 137 mmol/L (ref 135–145)

## 2023-07-16 LAB — CBC
HCT: 43.4 % (ref 39.0–52.0)
Hemoglobin: 14.6 g/dL (ref 13.0–17.0)
MCH: 28.8 pg (ref 26.0–34.0)
MCHC: 33.6 g/dL (ref 30.0–36.0)
MCV: 85.6 fL (ref 80.0–100.0)
Platelets: 297 10*3/uL (ref 150–400)
RBC: 5.07 MIL/uL (ref 4.22–5.81)
RDW: 14.8 % (ref 11.5–15.5)
WBC: 7.3 10*3/uL (ref 4.0–10.5)
nRBC: 0 % (ref 0.0–0.2)

## 2023-07-16 LAB — D-DIMER, QUANTITATIVE: D-Dimer, Quant: 0.38 ug{FEU}/mL (ref 0.00–0.50)

## 2023-07-16 LAB — TROPONIN I (HIGH SENSITIVITY)
Troponin I (High Sensitivity): 9 ng/L (ref <18)
Troponin I (High Sensitivity): 9 ng/L (ref <18)

## 2023-07-16 MED ORDER — CYCLOBENZAPRINE HCL 5 MG PO TABS
5.0000 mg | ORAL_TABLET | Freq: Three times a day (TID) | ORAL | 0 refills | Status: DC | PRN
  Filled 2023-07-16: qty 30, 10d supply, fill #0

## 2023-07-16 MED ORDER — DIAZEPAM 5 MG PO TABS
5.0000 mg | ORAL_TABLET | Freq: Once | ORAL | Status: AC
  Administered 2023-07-16: 5 mg via ORAL
  Filled 2023-07-16: qty 1

## 2023-07-16 NOTE — ED Notes (Signed)
 Interpreter used Arnetta Lank [phone]

## 2023-07-16 NOTE — ED Notes (Signed)
 D-dimer recollected and resent to lab.

## 2023-07-16 NOTE — Progress Notes (Signed)
 Established Patient Office Visit  Subjective   Patient ID: Phillip [name], male    DOB: 1965-05-19  Age: 58 y.o. MRN: [id number]  Chief Complaint  Patient presents with   Chest Pain     Pt stated that he was having left side arm pain a few days ago and today  he is having  left sided chest pains     Phillip [name] is a 58 y.o. who presents to the clinic for left sided chest pain. Please see problem based assessment and plan for additional details.   Patient Active Problem List   Diagnosis Date Noted   PVC's (premature ventricular contractions) 05/31/2021   Chronic systolic heart failure (HCC) 05/03/2021   Diabetes mellitus without complication, without long-term current use of insulin  (HCC) 03/08/2021   History of corneal transplant 07/08/2016   Obesity 04/24/2015   Erectile dysfunction 03/02/2015   Non-ischemic cardiomyopathy (HCC) 06/15/2014   Hyperlipidemia 05/03/2014   Essential hypertension 11/15/2012   Chest pain 11/15/2012     Objective:     BP 139/67 (BP Location: Left Arm, Patient Position: Sitting, Cuff Size: Large)   Pulse 60   Temp 98.1 F (36.7 C) (Oral)   Ht 5\' 7"  (1.702 m)   Wt 212 lb (96.2 kg)   SpO2 98%   BMI 33.20 kg/m  BP Readings from Last 3 Encounters:  07/16/23 139/88  07/16/23 139/67  05/05/23 137/87   Wt Readings from Last 3 Encounters:  07/16/23 205 lb (93 kg)  07/16/23 212 lb (96.2 kg)  05/05/23 206 lb 12.8 oz (93.8 kg)      Physical Exam Vitals reviewed.  Constitutional:      General: He is not in acute distress.    Appearance: He is obese. He is not toxic-appearing.  Cardiovascular:     Rate and Rhythm: Normal rate and regular rhythm.     Pulses:          Radial pulses are 2+ on the right side and 2+ on the left side.     Heart sounds: Normal heart sounds, S1 normal and S2 normal. No murmur heard.    No friction rub. No gallop. No S3 or S4 sounds.  Pulmonary:     Effort: Pulmonary effort is normal. No  tachypnea or accessory muscle usage.     Breath sounds: Normal breath sounds and air entry.  Skin:    General: Skin is warm and dry.  Neurological:     Mental Status: He is alert.  Psychiatric:        Mood and Affect: Mood and affect normal.        Behavior: Behavior normal. Behavior is cooperative.      No results found for any visits on 07/16/23.  Last metabolic panel Lab Results  Component Value Date   GLUCOSE 108 (H) 03/05/2023   NA 138 03/05/2023   K 4.9 03/05/2023   CL 102 03/05/2023   CO2 19 (L) 03/05/2023   BUN 28 (H) 03/05/2023   CREATININE 1.12 03/05/2023   EGFR 76 03/05/2023   CALCIUM  9.8 03/05/2023   PHOS 3.7 11/29/2021   PROT 7.7 01/10/2020   ALBUMIN 4.1 01/10/2020   LABGLOB 3.3 12/21/2018   AGRATIO 1.3 12/21/2018   BILITOT 0.8 01/10/2020   ALKPHOS 48 01/10/2020   AST 29 01/10/2020   ALT 34 01/10/2020   ANIONGAP 11 05/06/2022   Last lipids Lab Results  Component Value Date   CHOL 204 (H) 03/05/2023   HDL  33 (L) 03/05/2023   LDLCALC 118 (H) 03/05/2023   TRIG 302 (H) 03/05/2023   CHOLHDL 6.2 (H) 03/05/2023   Last hemoglobin A1c Lab Results  Component Value Date   HGBA1C 6.1 (A) 03/05/2023      The 10-year ASCVD risk score (Arnett DK, et al., 2019) is: 28.1%    Assessment & Plan:   Problem List Items Addressed This Visit       Other   Chest pain - Primary   Patient presents with left sided chest pain and left arm that started a few days prior. Up until today, the pain would wax and wane. Starting this morning, the chest pain and left arm pain became constant. He describes the pain as a "deep ache" that does not worsen with palpation or movement of LUE. He denies recent heavy lifting or increase in activity. He denies angina with exertion but does report shortness of breath with exertion. He denies recent URI or viral illness.  Currently, he denies shortness of breath or new lower extremity edema.   Exam performed today revealed: RRR, no  murmurs appreciated, lungs clear to auscultate bilaterally. Chest pain and L arm pain were NOT reproducible with palpation and movement of LUE extremity.   Cardiac catheterization in 2016: No significant coronary disease. Nonischemic cardiomyopathy.   With his past medical history of HLD, HTN, T2DM, nonischemic cardiomyopathy, obesity, and symptoms present : there is an increased concern for cardiac ischemic pathology. EKG in the office was stable compared to prior EKG in February 2023.   Patient was sent to the emergency department for additional work up for cardiac ischemia and additional life threatening etiologies of angina.       Relevant Orders   EKG 12-Lead (Completed)    Return in about 1 week (around 07/23/2023) for ED follow up .    Aurora Lees, DO

## 2023-07-16 NOTE — ED Notes (Signed)
 Interpreter used for discharge instructions Phillip [name] [phone]

## 2023-07-16 NOTE — Assessment & Plan Note (Signed)
 Patient presents with left sided chest pain and left arm that started a few days prior. Up until today, the pain would wax and wane. Starting this morning, the chest pain and left arm pain became constant. He describes the pain as a "deep ache" that does not worsen with palpation or movement of LUE. He denies recent heavy lifting or increase in activity. He denies angina with exertion but does report shortness of breath with exertion. He denies recent URI or viral illness.  Currently, he denies shortness of breath or new lower extremity edema.   Exam performed today revealed: RRR, no murmurs appreciated, lungs clear to auscultate bilaterally. Chest pain and L arm pain were NOT reproducible with palpation and movement of LUE extremity.   Cardiac catheterization in 2016: No significant coronary disease. Nonischemic cardiomyopathy.   With his past medical history of HLD, HTN, T2DM, nonischemic cardiomyopathy, obesity, and symptoms present : there is an increased concern for cardiac ischemic pathology. EKG in the office was stable compared to prior EKG in February 2023.   Patient was sent to the emergency department for additional work up for cardiac ischemia and additional life threatening etiologies of angina.

## 2023-07-16 NOTE — ED Triage Notes (Signed)
 Pt here for left arm pain and chest pain that started 2 days ago. SHOB.

## 2023-07-16 NOTE — Discharge Instructions (Addendum)
 Take Tylenol  for your pain, and the muscle relaxer.  Do not operate heavy machinery, while on the muscle relaxer.  Please follow-up with your primary care doctor, and cardiologist.  Your heart enzymes, and your blood work today is overall reassuring I think this likely represents a muscle strain

## 2023-07-16 NOTE — ED Provider Notes (Signed)
 [place] EMERGENCY DEPARTMENT AT [facility] Provider Note   CSN: [id number] Arrival date & time: 07/16/23  4696     History  Chief Complaint  Patient presents with   Chest Pain    Phillip [name] is a 58 y.o. male, history of CHF, diabetes, hypertension, who presents to the ED secondary to chest pain, rating to the left arm, this been going on for the last 3 days.  He states that it is worse, when laying down, from the hours 3 AM to 7 AM, notes that today it lasted longer so he decided come to the ER.  States his pain is currently a 5 out of 10, and when it is very bad in the a.m. at 6 out of 10.  Notes that it is pleuritic in nature, and associated with some shortness of breath, worse with moving.  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  And lifts heavy things frequently.  He states the pain is sharp and stabbing, from the left side of his chest, radiating down his left arm.  Denies any numbness or tingling however his left arm.  Denies any nausea, vomiting, or abdominal pain or back pain.No has not been on any of his diabetic medications or heart failure medications, for the last 3 months, given that his provider cleared him for this.He does not take any blood thinners, and denies any recent trauma or surgery     Home Medications Prior to Admission medications   Medication Sig Start Date End Date Taking? Authorizing Provider  cyclobenzaprine  (FLEXERIL ) 5 MG tablet Take 1 tablet (5 mg total) by mouth 3 (three) times daily as needed. 07/16/23  Yes Jackelynn Hosie L, PA  blood glucose meter kit and supplies KIT Use up to four times daily as directed. 11/30/21   Mapp, Tavien, MD  furosemide  (LASIX ) 40 MG tablet Take 1 tablet (40 mg total) by mouth daily as needed for edema. Take as needed if legs are swollen. 03/24/23 03/23/24  Driscilla George, MD  glucose blood (ONETOUCH VERIO) test strip Use as directed up to four times daily  03/28/23   Lasandra Points, MD  Lancets [facility] DELICA PLUS [place]) MISC Use up to 4 (four) times daily as directed 09/03/22   Lasandra Points, MD  Lancets 33G MISC Use as directed up to four times daily 11/30/21   Mapp, Tavien, MD  lisinopril  (ZESTRIL ) 10 MG tablet Take 1 tablet (10 mg total) by mouth daily. 05/15/23   Guilloud, Carolyn, MD  rosuvastatin  (CRESTOR ) 20 MG tablet Take 1 tablet (20 mg total) by mouth daily. 03/05/23   Guilloud, Carolyn, MD  Insulin  Glargine (BASAGLAR  KWIKPEN) 100 UNIT/ML Inject 10 Units into the skin daily. 12/31/21 03/21/22  Gawaluck, Greylon, MD      Allergies    Patient has no known allergies.    Review of Systems   Review of Systems  Respiratory:  Positive for shortness of breath.   Cardiovascular:  Positive for chest pain. Negative for leg swelling.    Physical Exam Updated Vital Signs BP (!) 141/71   Pulse 60   Temp 97.9 F (36.6 C) (Oral)   Resp 12   Ht 5\' 6"  (1.676 m)   Wt 93 kg   SpO2 100%   BMI 33.09 kg/m  Physical Exam Vitals and nursing note reviewed.  Constitutional:      General: He is not in acute distress.    Appearance: He is  well-developed.  HENT:     Head: Normocephalic and atraumatic.  Eyes:     Conjunctiva/sclera: Conjunctivae normal.  Cardiovascular:     Rate and Rhythm: Normal rate and regular rhythm.     Heart sounds: No murmur heard. Pulmonary:     Effort: Pulmonary effort is normal. No respiratory distress.     Breath sounds: Normal breath sounds.  Chest:       Comments: Tenderness to palpation along left rib cage, under left breast, without any erythema, edema, or crepitus.  No rash noted. Abdominal:     Palpations: Abdomen is soft.     Tenderness: There is no abdominal tenderness.  Musculoskeletal:        General: No swelling.     Cervical back: Neck supple.     Comments: Pain worse with truncal rotation, and movement of the arm.  Skin:    General: Skin is warm and dry.     Capillary Refill:  Capillary refill takes less than 2 seconds.  Neurological:     Mental Status: He is alert.  Psychiatric:        Mood and Affect: Mood normal.     ED Results / Procedures / Treatments   Labs (all labs ordered are listed, but only abnormal results are displayed) Labs Reviewed  BASIC METABOLIC PANEL WITH GFR - Abnormal; Notable for the following components:      Result Value   Glucose, Bld 154 (*)    All other components within normal limits  CBC  D-DIMER, QUANTITATIVE (NOT AT [facility])  TROPONIN I (HIGH SENSITIVITY)  TROPONIN I (HIGH SENSITIVITY)    EKG None  Radiology DG Chest 2 View Result Date: 07/16/2023 CLINICAL DATA:  Chest pain EXAM: CHEST - 2 VIEW COMPARISON:  July 13, 2021 FINDINGS: No focal airspace consolidation, pleural effusion, or pneumothorax. No cardiomegaly. No acute fracture or destructive lesion. Multilevel degenerative disc disease of the spine. IMPRESSION: No acute cardiopulmonary abnormality. Electronically Signed   By: Rance Burrows M.D.   On: 07/16/2023 10:32    Procedures Procedures    Medications Ordered in ED Medications  diazepam  (VALIUM ) tablet 5 mg (5 mg Oral Given 07/16/23 1809)    ED Course/ Medical Decision Making/ A&P             HEART Score: 5                    Medical Decision Making Patient is a 58 year old male, here for chest pain that is been going on for several days, states is worse in the morning, and with movement.  He has tenderness to palpation, the left chest wall, and left shoulder.  Worse with range of motion.  He does have a significant cardiac history thus we will obtain troponins, and dimer given his pleuritic in nature.  Has not had any chest wall trauma.  No bruising or rash noted.  Will give Valium , and secondary to suspicion for possible muscle spasm, or strain.  Amount and/or Complexity of Data Reviewed Labs: ordered.    Details: Troponin x 2 within normal limits, D-dimer less than 0.5 Radiology: ordered.     Details: Chest x-ray clear ECG/medicine tests:  Decision-making details documented in ED Course. Discussion of management or test interpretation with external provider(s): Discussed with patient, his blood work is overall reassuring, dimer is negative, he is feeling better after the Valium , I suspicious this likely represents a muscle strain, versus spasm, as it is relieved, with an antispasmodic,  and as she was tenderness to palpation of the chest wall, he does have a fairly physical job, with lots of heavy lifting, I believe this is likely what is causing this.  We discussed follow-up with primary care doctor, and return precautions were emphasized.  Discharged home  Risk Prescription drug management.    Final Clinical Impression(s) / ED Diagnoses Final diagnoses:  Chest pain, unspecified type  Pain of left upper extremity    Rx / DC Orders ED Discharge Orders          Ordered    cyclobenzaprine  (FLEXERIL ) 5 MG tablet  3 times daily PRN        07/16/23 1957              Soraya Paquette, Dwaine Gip, PA 07/16/23 2004    Rafael Bun A, DO 07/21/23 1316

## 2023-07-17 ENCOUNTER — Other Ambulatory Visit (HOSPITAL_COMMUNITY): Payer: Self-pay

## 2023-07-25 NOTE — Progress Notes (Signed)
 Internal Medicine Clinic Attending  Case discussed with the resident at the time of the visit.  We reviewed the resident's history and exam and pertinent patient test results.  I agree with the assessment, diagnosis, and plan of care documented in the resident's note.

## 2023-08-25 ENCOUNTER — Other Ambulatory Visit: Payer: Self-pay

## 2023-08-25 ENCOUNTER — Other Ambulatory Visit (HOSPITAL_COMMUNITY): Payer: Self-pay

## 2023-08-27 ENCOUNTER — Other Ambulatory Visit (HOSPITAL_COMMUNITY): Payer: Self-pay

## 2023-11-13 ENCOUNTER — Other Ambulatory Visit (HOSPITAL_COMMUNITY): Payer: Self-pay

## 2023-11-17 ENCOUNTER — Other Ambulatory Visit: Payer: Self-pay

## 2023-11-17 ENCOUNTER — Other Ambulatory Visit (HOSPITAL_COMMUNITY): Payer: Self-pay

## 2023-11-17 ENCOUNTER — Ambulatory Visit: Admitting: Student

## 2023-11-17 VITALS — BP 124/74 | HR 74 | Temp 97.8°F | Ht 72.0 in | Wt 204.4 lb

## 2023-11-17 DIAGNOSIS — E785 Hyperlipidemia, unspecified: Secondary | ICD-10-CM

## 2023-11-17 DIAGNOSIS — R350 Frequency of micturition: Secondary | ICD-10-CM | POA: Diagnosis not present

## 2023-11-17 DIAGNOSIS — E119 Type 2 diabetes mellitus without complications: Secondary | ICD-10-CM | POA: Diagnosis not present

## 2023-11-17 DIAGNOSIS — I10 Essential (primary) hypertension: Secondary | ICD-10-CM

## 2023-11-17 DIAGNOSIS — I428 Other cardiomyopathies: Secondary | ICD-10-CM

## 2023-11-17 DIAGNOSIS — R82998 Other abnormal findings in urine: Secondary | ICD-10-CM | POA: Diagnosis not present

## 2023-11-17 LAB — POCT GLYCOSYLATED HEMOGLOBIN (HGB A1C): HbA1c, POC (controlled diabetic range): 6 % (ref 0.0–7.0)

## 2023-11-17 LAB — GLUCOSE, CAPILLARY: Glucose-Capillary: 166 mg/dL — ABNORMAL HIGH (ref 70–99)

## 2023-11-17 MED ORDER — BLOOD GLUCOSE MONITOR KIT
PACK | 0 refills | Status: AC
  Filled 2023-11-17: qty 1, 30d supply, fill #0

## 2023-11-17 MED ORDER — ROSUVASTATIN CALCIUM 20 MG PO TABS
20.0000 mg | ORAL_TABLET | Freq: Every day | ORAL | 3 refills | Status: AC
  Filled 2023-11-17: qty 90, 90d supply, fill #0
  Filled 2024-04-04: qty 90, 90d supply, fill #1

## 2023-11-17 NOTE — Patient Instructions (Addendum)
 Thank you, [name] for allowing us  to provide your care today. Today we discussed blood pressure, diabetes, and foamy urine.  I have ordered labs to assess the abnormal urine.   Continue the great work!    I have ordered the following labs for you:   Lab Orders         Urinalysis, Reflex Microscopic         Glucose, capillary         Microalbumin / Creatinine Urine Ratio         POC Hbg A1C       Referrals ordered today:    Referral Orders         Ambulatory referral to Ophthalmology      I have ordered the following medication/changed the following medications:   Stop the following medications: Medications Discontinued During This Encounter  Medication Reason   Lancets 33G MISC    Lancets (ONETOUCH DELICA PLUS LANCET33G) MISC    glucose blood (ONETOUCH VERIO) test strip    blood glucose meter kit and supplies KIT    cyclobenzaprine  (FLEXERIL ) 5 MG tablet    furosemide  (LASIX ) 40 MG tablet    rosuvastatin  (CRESTOR ) 20 MG tablet Reorder     Start the following medications: Meds ordered this encounter  Medications   rosuvastatin  (CRESTOR ) 20 MG tablet    Sig: Take 1 tablet (20 mg total) by mouth daily.    Dispense:  90 tablet    Refill:  3   blood glucose meter kit and supplies KIT    Sig: Use up to four times daily as directed.    Dispense:  1 each    Refill:  0    Number of strips:   100    Number of lancets:   100     Follow up:6 weeks for Lipid profile check lab, 3 moths with provider   Should you have any questions or concerns please call the internal medicine clinic at [phone].     Please note that our late policy has changed.  If you are more than 15 minutes late to your appointment, you may be asked to reschedule your appointment.  Dr. Kandis, D.O. [facility]    [place] D. [name], por permitirnos atenderlo hoy. Hoy hablamos sobre la presin arterial, la diabetes y la orina  espumosa.  He solicitado anlisis de laboratorio para evaluar la orina anormal.  Siga con el excelente veda!  He solicitado los siguientes anlisis:  Ordenes de laboratorio Anlisis de orina, microscpico reflejo Glucosa, capilar Relacin microalbmina/creatinina en orina Hbg A1C en el punto de atencin  Remisiones solicitadas hoy:  rdenes de referencia Remisin ambulatoria a Oftalmologa  He solicitado/cambiado los siguientes medicamentos:  Suspender los siguientes medicamentos: Medicamentos suspendidos durante esta consulta Motivo del medicamento  Lancetas 33G MISC  Lancetas (ONETOUCH DELICA PLUS [place]) MISC  Tira reactiva para glucosa en sangre (ONETOUCH VERIO)  Kit y suministros para el medidor de glucosa en sangre  Ciclobenzaprina (FLEXERIL ) tableta de 5 mg  Furosemida (LASIX ) tableta de 40 mg  Rosuvastatina (CRESTOR ) tableta de 20 mg Reordenar  Iniciar los siguientes medicamentos: Medicamentos recetados en esta consulta Medicamentos  Rosuvastatina (CRESTOR ) tableta de 20 mg  Significado: Tomar 1 tableta (20 mg en total) por va oral al da.  Dispensacin: 90 tabletas  Resurtido: 3  Kit de medidor de glucosa en sangre y suministros  Significado: Usar hasta cuatro veces al da segn las indicaciones.  Dispensacin: 1  de cada  Resurtido: 0 Nmero de tiras: 100 Nmero de lancetas: 100  Seguimiento: 6 semanas para anlisis de perfil lipdico, 3 meses con el mdico  Si tiene alguna pregunta o inquietud, llame a la clnica de medicina interna al [phone].  Tenga en cuenta que nuestra poltica de retrasos ha cambiado. Si llega ms de 15 minutos tarde a su cita, es posible que se Publishing rights manager.  Dr. Kandis, D.O. [facility]

## 2023-11-17 NOTE — Progress Notes (Unsigned)
 Established Patient Office Visit  Subjective   Patient ID: Phillip [name], male    DOB: [birth year]  Age: 58 y.o. MRN: [id number]  Chief Complaint  Patient presents with   Follow-up   Diabetes   Hypertension   Urinary Frequency    Pt reports he also has been seeing a lot of foam when her urinates  for over a month now     Phillip [name] is a 58 y.o. who presents to the clinic for T2DM, HTN, and urine described as foamy. Please see problem based assessment and plan for additional details.   Patient Active Problem List   Diagnosis Date Noted   Foamy urine 11/18/2023   PVC's (premature ventricular contractions) 05/31/2021   Diabetes mellitus without complication, without long-term current use of insulin  (HCC) 03/08/2021   History of corneal transplant 07/08/2016   Obesity 04/24/2015   Erectile dysfunction 03/02/2015   Non-ischemic cardiomyopathy (HCC) 06/15/2014   Hyperlipidemia 05/03/2014   Essential hypertension 11/15/2012   Chest pain 11/15/2012      Objective:     BP 124/74 (BP Location: Right Arm, Patient Position: Sitting, Cuff Size: Large)   Pulse 74   Temp 97.8 F (36.6 C) (Oral)   Ht 6' (1.829 m)   Wt 204 lb 6.4 oz (92.7 kg)   SpO2 98%   BMI 27.72 kg/m  BP Readings from Last 3 Encounters:  11/17/23 124/74  07/16/23 131/82  07/16/23 139/67   Wt Readings from Last 3 Encounters:  11/17/23 204 lb 6.4 oz (92.7 kg)  07/16/23 205 lb (93 kg)  07/16/23 212 lb (96.2 kg)      Physical Exam Vitals reviewed.  Constitutional:      General: He is not in acute distress.    Appearance: He is not toxic-appearing.  Cardiovascular:     Rate and Rhythm: Normal rate and regular rhythm.     Heart sounds: No murmur heard. Pulmonary:     Effort: Pulmonary effort is normal. No respiratory distress.     Breath sounds: Normal breath sounds. No wheezing or rales.  Abdominal:     Palpations: Abdomen is soft.     Tenderness: There is no abdominal  tenderness.  Musculoskeletal:     Right lower leg: No edema.  Skin:    General: Skin is warm and dry.  Neurological:     Mental Status: He is alert.  Psychiatric:        Mood and Affect: Mood normal.        Behavior: Behavior normal.     Results for orders placed or performed in visit on 11/17/23  Microscopic Examination  Result Value Ref Range   WBC, UA None seen 0 - 5 /hpf   RBC, Urine None seen 0 - 2 /hpf   Epithelial Cells (non renal) None seen 0 - 10 /hpf   Casts None seen None seen /lpf   Bacteria, UA None seen None seen/Few  Urinalysis, Reflex Microscopic  Result Value Ref Range   Specific Gravity, UA 1.022 1.005 - 1.030   pH, UA 5.5 5.0 - 7.5   Color, UA Yellow Yellow   Appearance Ur Clear Clear   Leukocytes,UA Negative Negative   Protein,UA 1+ (A) Negative/Trace   Glucose, UA Negative Negative   Ketones, UA Negative Negative   RBC, UA Negative Negative   Bilirubin, UA Negative Negative   Urobilinogen, Ur 0.2 0.2 - 1.0 mg/dL   Nitrite, UA Negative Negative   Microscopic Examination See below:  Glucose, capillary  Result Value Ref Range   Glucose-Capillary 166 (H) 70 - 99 mg/dL  POC Hbg J8R  Result Value Ref Range   Hemoglobin A1C     HbA1c POC (<> result, manual entry)     HbA1c, POC (prediabetic range)     HbA1c, POC (controlled diabetic range) 6.0 0.0 - 7.0 %    Last metabolic panel Lab Results  Component Value Date   GLUCOSE 154 (H) 07/16/2023   NA 137 07/16/2023   K 4.0 07/16/2023   CL 104 07/16/2023   CO2 25 07/16/2023   BUN 19 07/16/2023   CREATININE 0.82 07/16/2023   GFRNONAA >60 07/16/2023   CALCIUM  9.1 07/16/2023   PHOS 3.7 11/29/2021   PROT 7.7 01/10/2020   ALBUMIN 4.1 01/10/2020   LABGLOB 3.3 12/21/2018   AGRATIO 1.3 12/21/2018   BILITOT 0.8 01/10/2020   ALKPHOS 48 01/10/2020   AST 29 01/10/2020   ALT 34 01/10/2020   ANIONGAP 8 07/16/2023   Last hemoglobin A1c Lab Results  Component Value Date   HGBA1C 6.0 11/17/2023       The 10-year ASCVD risk score (Arnett DK, et al., 2019) is: 23.5%    Assessment & Plan:   Problem List Items Addressed This Visit       Cardiovascular and Mediastinum   Non-ischemic cardiomyopathy (HCC) (Chronic)   Patient has a history of heart failure with recovered ejection fraction.  In 2016, an echocardiogram showed an EF of 30 to 35% and a catheterization during that time showed normal coronary arteries.  Patient was worked up for Chagas disease which was negative.  His last EF 2023 was 60-65%.  Patient is currently only taking lisinopril  10 mg.  It appears that he was on a beta-blocker in the past and had bradycardia with rates in the 30s.  His blood pressure at this moment will not tolerate spironolactone .  He is currently not volume overloaded and denies taking Lasix  within the past 6 months.  He was last seen by cardiology in 2023 where there are no changes to his plan made at that time. Plan: -Will continue lisinopril  10 mg, if patient's blood pressure will tolerate additional GDMT, would recommend trying ARNI or addition of BB and spironolactone .       Relevant Medications   rosuvastatin  (CRESTOR ) 20 MG tablet   Essential hypertension   Patient presents with a history of hypertension with a blood pressure today of 124/74. Their hypertension is controlled on a regimen of lisinopril  10mg . Prior BMP in April was WNL.   Plan: -Continue current regimen of Lisinopril        Relevant Medications   rosuvastatin  (CRESTOR ) 20 MG tablet     Endocrine   Diabetes mellitus without complication, without long-term current use of insulin  (HCC) - Primary   Patient presents with a history of T2DM with a prior A1c of 6.1 in 02/2023.  Patient's A1c today is 6.0.  He is diet controlled.  Plan: -Continue regimen of diet/exercise controlled T2DM -A1c today -Ophthalmology referral ordered       Relevant Medications   rosuvastatin  (CRESTOR ) 20 MG tablet   Other Relevant Orders   POC Hbg  A1C (Completed)   Ambulatory referral to Ophthalmology   Microalbumin / Creatinine Urine Ratio     Other   Hyperlipidemia (Chronic)   Patient's last LDL was checked in 02/2023 with a level of 118. He is not currently taking his Crestor .  -Restart crestor , recheck in 6 weeks  Relevant Medications   rosuvastatin  (CRESTOR ) 20 MG tablet   Foamy urine   Patient reports a 1 month duration of urine that appears more foamy.  He denies dysuria, hematuria, increased urinary frequency, polydipsia, nocturia, weakened stream, or inability to empty bladder.  Prior urine ACR was collected in December 2024 and measured at less than 5.  With his description of foamy urine, do have concerns for proteinuria which I will evaluate with a UA and a repeat urine ACR.       Relevant Orders   Microalbumin / Creatinine Urine Ratio   Other Visit Diagnoses       Urinary frequency       Relevant Orders   Urinalysis, Reflex Microscopic (Completed)       Return in about 3 months (around 02/17/2024) for 6 weeks for lab, 3 months with provider .    Damien Lease, DO

## 2023-11-18 ENCOUNTER — Other Ambulatory Visit (HOSPITAL_COMMUNITY): Payer: Self-pay

## 2023-11-18 ENCOUNTER — Other Ambulatory Visit: Payer: Self-pay

## 2023-11-18 DIAGNOSIS — R82998 Other abnormal findings in urine: Secondary | ICD-10-CM | POA: Insufficient documentation

## 2023-11-18 LAB — URINALYSIS, ROUTINE W REFLEX MICROSCOPIC
Bilirubin, UA: NEGATIVE
Glucose, UA: NEGATIVE
Ketones, UA: NEGATIVE
Leukocytes,UA: NEGATIVE
Nitrite, UA: NEGATIVE
RBC, UA: NEGATIVE
Specific Gravity, UA: 1.022 (ref 1.005–1.030)
Urobilinogen, Ur: 0.2 mg/dL (ref 0.2–1.0)
pH, UA: 5.5 (ref 5.0–7.5)

## 2023-11-18 LAB — MICROSCOPIC EXAMINATION
Bacteria, UA: NONE SEEN
Casts: NONE SEEN /LPF
Epithelial Cells (non renal): NONE SEEN /HPF (ref 0–10)
RBC, Urine: NONE SEEN /HPF (ref 0–2)
WBC, UA: NONE SEEN /HPF (ref 0–5)

## 2023-11-18 MED ORDER — ONETOUCH DELICA PLUS LANCET33G MISC
Freq: Four times a day (QID) | 2 refills | Status: AC
  Filled 2023-11-18: qty 100, 25d supply, fill #0

## 2023-11-18 NOTE — Assessment & Plan Note (Signed)
 Patient's last LDL was checked in 02/2023 with a level of 118. He is not currently taking his Crestor .  -Restart crestor , recheck in 6 weeks

## 2023-11-18 NOTE — Assessment & Plan Note (Signed)
 Patient presents with a history of hypertension with a blood pressure today of 124/74. Their hypertension is controlled on a regimen of lisinopril  10mg . Prior BMP in April was WNL.   Plan: -Continue current regimen of Lisinopril 

## 2023-11-18 NOTE — Assessment & Plan Note (Signed)
 Patient presents with a history of T2DM with a prior A1c of 6.1 in 02/2023.  Patient's A1c today is 6.0.  He is diet controlled.  Plan: -Continue regimen of diet/exercise controlled T2DM -A1c today -Ophthalmology referral ordered

## 2023-11-18 NOTE — Assessment & Plan Note (Signed)
 Patient has a history of heart failure with recovered ejection fraction.  In 2016, an echocardiogram showed an EF of 30 to 35% and a catheterization during that time showed normal coronary arteries.  Patient was worked up for Chagas disease which was negative.  His last EF 2023 was 60-65%.  Patient is currently only taking lisinopril  10 mg.  It appears that he was on a beta-blocker in the past and had bradycardia with rates in the 30s.  His blood pressure at this moment will not tolerate spironolactone .  He is currently not volume overloaded and denies taking Lasix  within the past 6 months.  He was last seen by cardiology in 2023 where there are no changes to his plan made at that time. Plan: -Will continue lisinopril  10 mg, if patient's blood pressure will tolerate additional GDMT, would recommend trying ARNI or addition of BB and spironolactone .

## 2023-11-18 NOTE — Assessment & Plan Note (Addendum)
 Patient reports a 1 month duration of urine that appears more foamy.  He denies dysuria, hematuria, increased urinary frequency, polydipsia, nocturia, weakened stream, or inability to empty bladder.  Prior urine ACR was collected in December 2024 and measured at less than 5.  With his description of foamy urine, do have concerns for proteinuria which I will evaluate with a UA and a repeat urine ACR.

## 2023-11-19 LAB — MICROALBUMIN / CREATININE URINE RATIO
Creatinine, Urine: 114.6 mg/dL
Microalb/Creat Ratio: 163 mg/g{creat} — ABNORMAL HIGH (ref 0–29)
Microalbumin, Urine: 186.9 ug/mL

## 2023-11-19 NOTE — Progress Notes (Signed)
 Internal Medicine Clinic Attending  Case discussed with the resident at the time of the visit.  We reviewed the resident's history and exam and pertinent patient test results.  I agree with the assessment, diagnosis, and plan of care documented in the resident's note.

## 2023-11-21 ENCOUNTER — Other Ambulatory Visit (HOSPITAL_COMMUNITY): Payer: Self-pay

## 2023-12-03 ENCOUNTER — Other Ambulatory Visit (HOSPITAL_COMMUNITY): Payer: Self-pay

## 2023-12-03 ENCOUNTER — Ambulatory Visit: Payer: Self-pay | Admitting: Student

## 2023-12-03 MED ORDER — EMPAGLIFLOZIN 10 MG PO TABS
10.0000 mg | ORAL_TABLET | Freq: Every day | ORAL | 11 refills | Status: DC
  Filled 2023-12-03: qty 30, 30d supply, fill #0

## 2023-12-05 ENCOUNTER — Other Ambulatory Visit (HOSPITAL_COMMUNITY): Payer: Self-pay

## 2023-12-05 ENCOUNTER — Other Ambulatory Visit: Payer: Self-pay | Admitting: Student

## 2023-12-05 ENCOUNTER — Encounter: Payer: Self-pay | Admitting: Student

## 2023-12-05 DIAGNOSIS — R809 Proteinuria, unspecified: Secondary | ICD-10-CM

## 2023-12-05 MED ORDER — LISINOPRIL 20 MG PO TABS
20.0000 mg | ORAL_TABLET | Freq: Every day | ORAL | 3 refills | Status: AC
  Filled 2023-12-05: qty 90, 90d supply, fill #0
  Filled 2024-04-04: qty 90, 90d supply, fill #1

## 2023-12-05 NOTE — Progress Notes (Signed)
 Patient was prescribed Jardiance  for microalbuminuria, and pharmacy was consulted to investigate cost. Patient has a high deductible plan, so all brand name medications are very expensive despite being covered. His Jardiance  is $617.34 for a 30ds, and even with the coupon card which gives a max of $175 off it would still be $440/mo. Discussed with PCP, who will plan to increase patient's lisinopril  and recheck UACR in ~3-6 months. Since patient's last few BP have been controlled, would recommend increase to lisinopril  15 mg daily (1.5x tabs).   BP Readings from Last 3 Encounters:  11/17/23 124/74  07/16/23 131/82  07/16/23 139/67   Lorain Baseman, PharmD [facility] [phone]

## 2024-01-06 ENCOUNTER — Encounter: Payer: Self-pay | Admitting: Student

## 2024-01-06 ENCOUNTER — Other Ambulatory Visit (HOSPITAL_COMMUNITY): Payer: Self-pay

## 2024-01-06 ENCOUNTER — Ambulatory Visit: Admitting: Student

## 2024-01-06 VITALS — BP 123/81 | HR 73 | Temp 98.6°F | Ht 72.0 in | Wt 200.0 lb

## 2024-01-06 DIAGNOSIS — E119 Type 2 diabetes mellitus without complications: Secondary | ICD-10-CM

## 2024-01-06 DIAGNOSIS — Z79899 Other long term (current) drug therapy: Secondary | ICD-10-CM | POA: Diagnosis not present

## 2024-01-06 DIAGNOSIS — Z1211 Encounter for screening for malignant neoplasm of colon: Secondary | ICD-10-CM

## 2024-01-06 DIAGNOSIS — I10 Essential (primary) hypertension: Secondary | ICD-10-CM | POA: Diagnosis not present

## 2024-01-06 DIAGNOSIS — Z8249 Family history of ischemic heart disease and other diseases of the circulatory system: Secondary | ICD-10-CM

## 2024-01-06 DIAGNOSIS — Z23 Encounter for immunization: Secondary | ICD-10-CM | POA: Diagnosis not present

## 2024-01-06 DIAGNOSIS — E785 Hyperlipidemia, unspecified: Secondary | ICD-10-CM | POA: Diagnosis not present

## 2024-01-06 NOTE — Progress Notes (Signed)
 CC: F/u visit  HPI: [name] is a 58 y.o. male living with a history stated below and presents today for f/u visit. Please see problem based assessment and plan for additional details.  Virtual Spanish interpreter present during encounter.   Past Medical History:  Diagnosis Date   Chronic back pain    neck down (07/20/2013)   Chronic systolic heart failure (HCC) 06/15/2014   Diabetes (HCC) 11/2021   GERD (gastroesophageal reflux disease)    High cholesterol    Hypertension    Renal calculus     Current Outpatient Medications on File Prior to Visit  Medication Sig Dispense Refill   blood glucose meter kit and supplies KIT Use up to four times daily as directed. 1 each 0   Lancets (ONETOUCH DELICA PLUS LANCET33G) MISC Use up to 4 (four) times daily as directed 100 each 2   lisinopril  (ZESTRIL ) 20 MG tablet Take 1 tablet (20 mg total) by mouth daily. 90 tablet 3   rosuvastatin  (CRESTOR ) 20 MG tablet Take 1 tablet (20 mg total) by mouth daily. 90 tablet 3   [DISCONTINUED] Insulin  Glargine (BASAGLAR  KWIKPEN) 100 UNIT/ML Inject 10 Units into the skin daily. 30 mL 2   No current facility-administered medications on file prior to visit.    Family History  Problem Relation Age of Onset   Hypertension Mother     Social History   Socioeconomic History   Marital status: Divorced    Spouse name: Not on file   Number of children: Not on file   Years of education: Not on file   Highest education level: Not on file  Occupational History   Not on file  Tobacco Use   Smoking status: Never   Smokeless tobacco: Never  Vaping Use   Vaping status: Never Used  Substance and Sexual Activity   Alcohol use: No    Alcohol/week: 0.0 standard drinks of alcohol   Drug use: No   Sexual activity: Yes  Other Topics Concern   Not on file  Social History Narrative      Social Drivers of Health   Financial Resource Strain: Medium Risk (04/03/2022)   Overall Financial  Resource Strain (CARDIA)    Difficulty of Paying Living Expenses: Somewhat hard  Food Insecurity: No Food Insecurity (04/03/2022)   Hunger Vital Sign    Worried About Running Out of Food in the Last Year: Never true    Ran Out of Food in the Last Year: Never true  Transportation Needs: Unknown (04/03/2022)   PRAPARE - Administrator, Civil Service (Medical): No    Lack of Transportation (Non-Medical): Not on file  Physical Activity: Not on file  Stress: Not on file  Social Connections: Socially Isolated (04/03/2022)   Social Connection and Isolation Panel    Frequency of Communication with Friends and Family: Once a week    Frequency of Social Gatherings with Friends and Family: Once a week    Attends Religious Services: More than 4 times per year    Active Member of [employer] or Organizations: No    Attends Banker Meetings: Never    Marital Status: Divorced  Catering manager Violence: Not At Risk (04/03/2022)   Humiliation, Afraid, Rape, and Kick questionnaire    Fear of Current or Ex-Partner: No    Emotionally Abused: No    Physically Abused: No    Sexually Abused: No    Review of Systems: ROS negative except for what is  noted on the assessment and plan.  Vitals:   01/06/24 1547 01/06/24 1605  BP: (!) 140/82 123/81  Pulse: 74 73  Temp: 98.6 F (37 C)   TempSrc: Oral   SpO2: 97%   Weight: 200 lb (90.7 kg)   Height: 6' (1.829 m)    Physical Exam: Constitutional: Alert, sitting up in chair comfortably, in no acute distress Cardiovascular: regular rate and rhythm Pulmonary/Chest: normal work of breathing on room air, lungs clear to auscultation bilaterally Neurological: alert & oriented x 3 Skin: warm and dry  Assessment & Plan:   Assessment & Plan Hyperlipidemia, unspecified hyperlipidemia type F/u visit after restarting Crestor  20 mg daily. Reports adherence. Repeat lipid panel today.  Essential hypertension Status: Controlled. BP today 123/81  on repeat. Reports taking lisinopril  20 mg daily (increase from 10 mg at LOV). Last BMP 06/2023 WNL. Denies any acute concerns/symptoms.   Plan -Continue: lisinopril  (of note, hold off any further GDMT for HFimpEF given normotensive readings today) -BMP today  Type 2 diabetes mellitus without complication, without long-term current use of insulin  (HCC) Status: Controlled. Last A1c of 6.0 in 10/2023.  A1c today is NOT DUE. Diet controlled. UACR at LOV elevated, attempted SGLT2 but unable 2/2 costs, increase ACEI instead. No acute concerns.   Plan -Continue lifestyle modifications -A1c every 6 months  -Ophthalmology exam: referral sent at LOV -Urine ACR repeat today, elevated at last visit, on ACEI, unable SGLT2 due to costs  -LDL 118 on 02/2023, back on Crestor  20 mg, repeat lipid panel  Colon cancer screening Cologuard ordered. FOBT done in 2024 negative.  Encounter for immunization Received flu shot today. Provided info for Shingles and Covid vaccine at local pharmacy.   Orders Placed This Encounter  Procedures   Flu vaccine trivalent PF, 6mos and older(Flulaval,Afluria,Fluarix,Fluzone)   Lipid Profile   Cologuard   Basic metabolic panel with GFR   Microalbumin / Creatinine Urine Ratio   Return in about 3 months (around 04/07/2024) for rountine check .   Patient discussed with Dr. Machen  Abryanna Musolino, D.O. [facility], PGY-3 Phone: [phone] Date 01/06/2024 Time 7:14 PM

## 2024-01-06 NOTE — Assessment & Plan Note (Signed)
 Status: Controlled. Last A1c of 6.0 in 10/2023.  A1c today is NOT DUE. Diet controlled. UACR at LOV elevated, attempted SGLT2 but unable 2/2 costs, increase ACEI instead. No acute concerns.   Plan -Continue lifestyle modifications -A1c every 6 months  -Ophthalmology exam: referral sent at LOV -Urine ACR repeat today, elevated at last visit, on ACEI, unable SGLT2 due to costs  -LDL 118 on 02/2023, back on Crestor  20 mg, repeat lipid panel

## 2024-01-06 NOTE — Assessment & Plan Note (Signed)
 Status: Controlled. BP today 123/81 on repeat. Reports taking lisinopril  20 mg daily (increase from 10 mg at LOV). Last BMP 06/2023 WNL. Denies any acute concerns/symptoms.   Plan -Continue: lisinopril  (of note, hold off any further GDMT for HFimpEF given normotensive readings today) -BMP today

## 2024-01-06 NOTE — Assessment & Plan Note (Signed)
 F/u visit after restarting Crestor  20 mg daily. Reports adherence. Repeat lipid panel today.

## 2024-01-06 NOTE — Patient Instructions (Addendum)
 Gracias, Phillip [name], por permitirnos atenderlo hoy. Hoy hablamos sobre:  - Anlisis de sangre para controlar el colesterol, los triglicridos, los electrolitos y los riones. Le llamar con los [place].  - Contine con lisinopril  20 mg y rosuvastatina 20 mg. - Le envi Cologuard para la deteccin de cncer de colon.  Seguimiento: 3-4 meses.  Si tiene alguna pregunta o inquietud, llame a la clnica de medicina interna al [phone].  ----------------------- Thank you, [name] for allowing us  to provide your care today. Today we discussed:  -Blood work today to check cholesterol, triglycerides and electrolytes and kidneys. I will call you with the results.  -Continue with lisinopril  20 mg and rosuvastatin  20 mg  -I sent cologuard to screen for colon cancer     Follow up: 3-4 months   Should you have any questions or concerns please call the internal medicine clinic at [phone].    Kielan Dreisbach, D.O. [facility]

## 2024-01-07 LAB — LIPID PANEL
Chol/HDL Ratio: 3.5 ratio (ref 0.0–5.0)
Cholesterol, Total: 114 mg/dL (ref 100–199)
HDL: 33 mg/dL — ABNORMAL LOW (ref >39)
LDL Chol Calc (NIH): 61 mg/dL (ref 0–99)
Triglycerides: 110 mg/dL (ref 0–149)
VLDL Cholesterol Cal: 20 mg/dL (ref 5–40)

## 2024-01-07 LAB — BASIC METABOLIC PANEL WITH GFR
BUN/Creatinine Ratio: 27 — ABNORMAL HIGH (ref 10–24)
BUN: 28 mg/dL — ABNORMAL HIGH (ref 8–27)
CO2: 21 mmol/L (ref 20–29)
Calcium: 9.7 mg/dL (ref 8.6–10.2)
Chloride: 104 mmol/L (ref 96–106)
Creatinine, Ser: 1.03 mg/dL (ref 0.76–1.27)
Glucose: 104 mg/dL — ABNORMAL HIGH (ref 70–99)
Potassium: 4.4 mmol/L (ref 3.5–5.2)
Sodium: 140 mmol/L (ref 134–144)
eGFR: 83 mL/min/1.73 (ref >59)

## 2024-01-07 LAB — MICROALBUMIN / CREATININE URINE RATIO
Creatinine, Urine: 92.6 mg/dL
Microalb/Creat Ratio: 243 mg/g{creat} — ABNORMAL HIGH (ref 0–29)
Microalbumin, Urine: 225.3 ug/mL

## 2024-01-07 NOTE — Progress Notes (Signed)
 Internal Medicine Clinic Attending  Case discussed with the resident at the time of the visit.  We reviewed the resident's history and exam and pertinent patient test results.  I agree with the assessment, diagnosis, and plan of care documented in the resident's note.

## 2024-01-09 ENCOUNTER — Ambulatory Visit: Payer: Self-pay | Admitting: Student

## 2024-01-12 ENCOUNTER — Other Ambulatory Visit (HOSPITAL_COMMUNITY): Payer: Self-pay

## 2024-01-15 ENCOUNTER — Other Ambulatory Visit (HOSPITAL_COMMUNITY): Payer: Self-pay

## 2024-02-20 ENCOUNTER — Encounter: Payer: Self-pay | Admitting: Student

## 2024-02-20 DIAGNOSIS — N521 Erectile dysfunction due to diseases classified elsewhere: Secondary | ICD-10-CM

## 2024-02-23 ENCOUNTER — Other Ambulatory Visit (HOSPITAL_COMMUNITY): Payer: Self-pay

## 2024-02-23 MED ORDER — SILDENAFIL CITRATE 100 MG PO TABS
50.0000 mg | ORAL_TABLET | Freq: Every day | ORAL | 5 refills | Status: AC | PRN
  Filled 2024-02-23: qty 15, 30d supply, fill #0

## 2024-04-19 ENCOUNTER — Other Ambulatory Visit (HOSPITAL_COMMUNITY): Payer: Self-pay
